# Patient Record
Sex: Female | Born: 1935 | Race: White | Hispanic: No | Marital: Married | Smoking: Former smoker
Health system: Southern US, Community
[De-identification: ages and names within clinical notes are randomized; demographics above are authoritative.]

## PROBLEM LIST (undated history)

## (undated) DIAGNOSIS — F329 Major depressive disorder, single episode, unspecified: Secondary | ICD-10-CM

## (undated) DIAGNOSIS — C801 Malignant (primary) neoplasm, unspecified: Secondary | ICD-10-CM

## (undated) DIAGNOSIS — I471 Supraventricular tachycardia, unspecified: Secondary | ICD-10-CM

## (undated) DIAGNOSIS — K219 Gastro-esophageal reflux disease without esophagitis: Secondary | ICD-10-CM

## (undated) DIAGNOSIS — Z923 Personal history of irradiation: Secondary | ICD-10-CM

## (undated) DIAGNOSIS — E039 Hypothyroidism, unspecified: Secondary | ICD-10-CM

## (undated) DIAGNOSIS — R911 Solitary pulmonary nodule: Secondary | ICD-10-CM

## (undated) DIAGNOSIS — T7840XA Allergy, unspecified, initial encounter: Secondary | ICD-10-CM

## (undated) DIAGNOSIS — J329 Chronic sinusitis, unspecified: Secondary | ICD-10-CM

## (undated) DIAGNOSIS — J449 Chronic obstructive pulmonary disease, unspecified: Secondary | ICD-10-CM

## (undated) DIAGNOSIS — F419 Anxiety disorder, unspecified: Secondary | ICD-10-CM

## (undated) DIAGNOSIS — I4892 Unspecified atrial flutter: Secondary | ICD-10-CM

## (undated) DIAGNOSIS — G4733 Obstructive sleep apnea (adult) (pediatric): Secondary | ICD-10-CM

## (undated) DIAGNOSIS — C3490 Malignant neoplasm of unspecified part of unspecified bronchus or lung: Secondary | ICD-10-CM

## (undated) DIAGNOSIS — F32A Depression, unspecified: Secondary | ICD-10-CM

## (undated) HISTORY — PX: ABDOMINAL HYSTERECTOMY: SHX81

## (undated) HISTORY — DX: Malignant neoplasm of unspecified part of unspecified bronchus or lung: C34.90

## (undated) HISTORY — DX: Major depressive disorder, single episode, unspecified: F32.9

## (undated) HISTORY — DX: Depression, unspecified: F32.A

## (undated) HISTORY — DX: Anxiety disorder, unspecified: F41.9

## (undated) HISTORY — DX: Chronic sinusitis, unspecified: J32.9

## (undated) HISTORY — DX: Supraventricular tachycardia, unspecified: I47.10

## (undated) HISTORY — DX: Chronic obstructive pulmonary disease, unspecified: J44.9

## (undated) HISTORY — PX: CYSTECTOMY: SUR359

## (undated) HISTORY — DX: Obstructive sleep apnea (adult) (pediatric): G47.33

## (undated) HISTORY — DX: Supraventricular tachycardia: I47.1

## (undated) HISTORY — DX: Personal history of irradiation: Z92.3

## (undated) HISTORY — DX: Hypothyroidism, unspecified: E03.9

## (undated) HISTORY — DX: Unspecified atrial flutter: I48.92

## (undated) HISTORY — PX: OTHER SURGICAL HISTORY: SHX169

## (undated) HISTORY — DX: Allergy, unspecified, initial encounter: T78.40XA

## (undated) HISTORY — PX: BLADDER REPAIR: SHX76

## (undated) HISTORY — PX: LUNG BIOPSY: SHX232

## (undated) HISTORY — DX: Gastro-esophageal reflux disease without esophagitis: K21.9

## (undated) HISTORY — DX: Solitary pulmonary nodule: R91.1

## (undated) HISTORY — PX: TONSILLECTOMY: SUR1361

## (undated) HISTORY — PX: APPENDECTOMY: SHX54

---

## 1998-01-22 ENCOUNTER — Other Ambulatory Visit: Admission: RE | Admit: 1998-01-22 | Discharge: 1998-01-22 | Payer: Self-pay | Admitting: *Deleted

## 1999-07-12 ENCOUNTER — Other Ambulatory Visit: Admission: RE | Admit: 1999-07-12 | Discharge: 1999-07-12 | Payer: Self-pay | Admitting: *Deleted

## 2000-09-27 ENCOUNTER — Other Ambulatory Visit: Admission: RE | Admit: 2000-09-27 | Discharge: 2000-09-27 | Payer: Self-pay | Admitting: *Deleted

## 2001-11-12 ENCOUNTER — Encounter: Payer: Self-pay | Admitting: Gastroenterology

## 2001-11-12 ENCOUNTER — Ambulatory Visit (HOSPITAL_COMMUNITY): Admission: RE | Admit: 2001-11-12 | Discharge: 2001-11-12 | Payer: Self-pay | Admitting: Gastroenterology

## 2003-06-15 ENCOUNTER — Encounter: Payer: Self-pay | Admitting: Internal Medicine

## 2003-06-15 ENCOUNTER — Ambulatory Visit (HOSPITAL_COMMUNITY): Admission: RE | Admit: 2003-06-15 | Discharge: 2003-06-15 | Payer: Self-pay | Admitting: Internal Medicine

## 2004-09-22 ENCOUNTER — Ambulatory Visit: Payer: Self-pay | Admitting: Internal Medicine

## 2004-10-07 ENCOUNTER — Ambulatory Visit: Payer: Self-pay | Admitting: Internal Medicine

## 2004-10-13 ENCOUNTER — Ambulatory Visit: Payer: Self-pay | Admitting: Internal Medicine

## 2004-10-20 ENCOUNTER — Ambulatory Visit: Payer: Self-pay | Admitting: Internal Medicine

## 2004-11-07 ENCOUNTER — Ambulatory Visit: Payer: Self-pay | Admitting: Internal Medicine

## 2004-11-18 ENCOUNTER — Ambulatory Visit: Payer: Self-pay | Admitting: Internal Medicine

## 2004-12-06 ENCOUNTER — Emergency Department (HOSPITAL_COMMUNITY): Admission: EM | Admit: 2004-12-06 | Discharge: 2004-12-06 | Payer: Self-pay | Admitting: Emergency Medicine

## 2004-12-09 ENCOUNTER — Ambulatory Visit: Payer: Self-pay | Admitting: Internal Medicine

## 2004-12-12 ENCOUNTER — Ambulatory Visit: Payer: Self-pay | Admitting: Internal Medicine

## 2004-12-22 ENCOUNTER — Ambulatory Visit: Payer: Self-pay | Admitting: Internal Medicine

## 2004-12-29 ENCOUNTER — Ambulatory Visit: Payer: Self-pay | Admitting: Internal Medicine

## 2005-01-04 ENCOUNTER — Ambulatory Visit: Payer: Self-pay | Admitting: Internal Medicine

## 2005-01-26 ENCOUNTER — Ambulatory Visit: Payer: Self-pay | Admitting: Internal Medicine

## 2005-02-02 ENCOUNTER — Ambulatory Visit: Payer: Self-pay | Admitting: Internal Medicine

## 2005-02-09 ENCOUNTER — Ambulatory Visit: Payer: Self-pay | Admitting: Internal Medicine

## 2005-03-09 ENCOUNTER — Ambulatory Visit: Payer: Self-pay | Admitting: Internal Medicine

## 2005-03-16 ENCOUNTER — Ambulatory Visit: Payer: Self-pay | Admitting: Internal Medicine

## 2005-03-17 ENCOUNTER — Ambulatory Visit: Payer: Self-pay | Admitting: Internal Medicine

## 2005-03-30 ENCOUNTER — Ambulatory Visit: Payer: Self-pay | Admitting: Internal Medicine

## 2005-04-06 ENCOUNTER — Ambulatory Visit: Payer: Self-pay | Admitting: Internal Medicine

## 2005-04-20 ENCOUNTER — Ambulatory Visit: Payer: Self-pay | Admitting: Internal Medicine

## 2005-05-03 ENCOUNTER — Ambulatory Visit: Payer: Self-pay | Admitting: Cardiology

## 2005-05-05 ENCOUNTER — Ambulatory Visit: Payer: Self-pay | Admitting: Internal Medicine

## 2005-05-15 ENCOUNTER — Ambulatory Visit: Payer: Self-pay | Admitting: Internal Medicine

## 2005-05-23 ENCOUNTER — Ambulatory Visit: Payer: Self-pay | Admitting: Internal Medicine

## 2005-06-09 ENCOUNTER — Ambulatory Visit: Payer: Self-pay | Admitting: Internal Medicine

## 2005-06-15 ENCOUNTER — Ambulatory Visit: Payer: Self-pay | Admitting: Internal Medicine

## 2005-07-10 ENCOUNTER — Encounter: Admission: RE | Admit: 2005-07-10 | Discharge: 2005-07-10 | Payer: Self-pay | Admitting: Internal Medicine

## 2005-07-13 ENCOUNTER — Ambulatory Visit: Payer: Self-pay | Admitting: Internal Medicine

## 2005-07-27 ENCOUNTER — Ambulatory Visit: Payer: Self-pay | Admitting: Internal Medicine

## 2005-08-03 ENCOUNTER — Ambulatory Visit: Payer: Self-pay | Admitting: Internal Medicine

## 2005-08-07 ENCOUNTER — Ambulatory Visit: Payer: Self-pay | Admitting: Pulmonary Disease

## 2005-08-22 ENCOUNTER — Ambulatory Visit: Payer: Self-pay | Admitting: Internal Medicine

## 2005-09-21 ENCOUNTER — Ambulatory Visit: Payer: Self-pay | Admitting: Internal Medicine

## 2005-10-24 ENCOUNTER — Ambulatory Visit: Payer: Self-pay | Admitting: Internal Medicine

## 2005-11-02 ENCOUNTER — Ambulatory Visit: Payer: Self-pay | Admitting: Internal Medicine

## 2005-11-06 ENCOUNTER — Ambulatory Visit: Payer: Self-pay | Admitting: Internal Medicine

## 2005-11-16 ENCOUNTER — Ambulatory Visit: Payer: Self-pay | Admitting: Internal Medicine

## 2005-11-23 ENCOUNTER — Ambulatory Visit: Payer: Self-pay | Admitting: Internal Medicine

## 2005-11-24 ENCOUNTER — Ambulatory Visit: Payer: Self-pay | Admitting: Internal Medicine

## 2005-11-30 ENCOUNTER — Ambulatory Visit: Payer: Self-pay | Admitting: Internal Medicine

## 2005-12-07 ENCOUNTER — Ambulatory Visit: Payer: Self-pay | Admitting: Internal Medicine

## 2005-12-28 ENCOUNTER — Other Ambulatory Visit: Admission: RE | Admit: 2005-12-28 | Discharge: 2005-12-28 | Payer: Self-pay | Admitting: Internal Medicine

## 2006-01-05 ENCOUNTER — Ambulatory Visit: Payer: Self-pay | Admitting: Internal Medicine

## 2006-01-18 ENCOUNTER — Ambulatory Visit: Payer: Self-pay | Admitting: Internal Medicine

## 2006-02-06 ENCOUNTER — Ambulatory Visit: Payer: Self-pay | Admitting: Internal Medicine

## 2006-02-23 ENCOUNTER — Ambulatory Visit: Payer: Self-pay | Admitting: Internal Medicine

## 2006-03-09 ENCOUNTER — Ambulatory Visit: Payer: Self-pay | Admitting: Internal Medicine

## 2006-03-15 ENCOUNTER — Ambulatory Visit: Payer: Self-pay | Admitting: Internal Medicine

## 2006-03-29 ENCOUNTER — Ambulatory Visit: Payer: Self-pay | Admitting: Internal Medicine

## 2006-04-19 ENCOUNTER — Ambulatory Visit: Payer: Self-pay | Admitting: Internal Medicine

## 2006-04-27 ENCOUNTER — Ambulatory Visit: Payer: Self-pay | Admitting: Internal Medicine

## 2006-05-04 ENCOUNTER — Ambulatory Visit: Payer: Self-pay | Admitting: Internal Medicine

## 2006-05-11 ENCOUNTER — Ambulatory Visit: Payer: Self-pay | Admitting: Internal Medicine

## 2006-05-14 ENCOUNTER — Ambulatory Visit: Payer: Self-pay | Admitting: Internal Medicine

## 2006-06-01 ENCOUNTER — Ambulatory Visit: Payer: Self-pay | Admitting: Internal Medicine

## 2006-06-15 ENCOUNTER — Ambulatory Visit: Payer: Self-pay | Admitting: Internal Medicine

## 2006-06-18 ENCOUNTER — Ambulatory Visit: Payer: Self-pay | Admitting: Internal Medicine

## 2006-06-22 ENCOUNTER — Ambulatory Visit: Payer: Self-pay | Admitting: Internal Medicine

## 2006-06-27 ENCOUNTER — Ambulatory Visit: Payer: Self-pay | Admitting: Internal Medicine

## 2006-07-13 ENCOUNTER — Ambulatory Visit: Payer: Self-pay | Admitting: Internal Medicine

## 2006-07-27 ENCOUNTER — Ambulatory Visit: Payer: Self-pay | Admitting: Internal Medicine

## 2006-08-17 ENCOUNTER — Ambulatory Visit: Payer: Self-pay | Admitting: Internal Medicine

## 2006-08-24 ENCOUNTER — Ambulatory Visit: Payer: Self-pay | Admitting: Internal Medicine

## 2006-08-31 ENCOUNTER — Ambulatory Visit: Payer: Self-pay | Admitting: Internal Medicine

## 2006-09-21 ENCOUNTER — Ambulatory Visit: Payer: Self-pay | Admitting: Internal Medicine

## 2006-09-28 ENCOUNTER — Ambulatory Visit: Payer: Self-pay | Admitting: Internal Medicine

## 2006-10-02 ENCOUNTER — Encounter (HOSPITAL_COMMUNITY): Admission: RE | Admit: 2006-10-02 | Discharge: 2006-12-31 | Payer: Self-pay | Admitting: Internal Medicine

## 2006-10-12 ENCOUNTER — Ambulatory Visit: Payer: Self-pay | Admitting: Internal Medicine

## 2006-10-26 ENCOUNTER — Ambulatory Visit: Payer: Self-pay | Admitting: Internal Medicine

## 2006-11-05 ENCOUNTER — Ambulatory Visit: Payer: Self-pay | Admitting: Internal Medicine

## 2006-11-16 ENCOUNTER — Ambulatory Visit: Payer: Self-pay | Admitting: Internal Medicine

## 2006-11-30 ENCOUNTER — Ambulatory Visit: Payer: Self-pay | Admitting: Internal Medicine

## 2006-12-06 ENCOUNTER — Ambulatory Visit: Payer: Self-pay | Admitting: Internal Medicine

## 2006-12-14 ENCOUNTER — Ambulatory Visit: Payer: Self-pay | Admitting: Internal Medicine

## 2006-12-21 ENCOUNTER — Ambulatory Visit: Payer: Self-pay | Admitting: Internal Medicine

## 2006-12-25 ENCOUNTER — Ambulatory Visit: Payer: Self-pay | Admitting: Internal Medicine

## 2007-01-11 ENCOUNTER — Ambulatory Visit: Payer: Self-pay | Admitting: Internal Medicine

## 2007-01-17 ENCOUNTER — Encounter (HOSPITAL_COMMUNITY): Admission: RE | Admit: 2007-01-17 | Discharge: 2007-04-17 | Payer: Self-pay | Admitting: Internal Medicine

## 2007-02-08 ENCOUNTER — Ambulatory Visit: Payer: Self-pay | Admitting: Internal Medicine

## 2007-02-15 ENCOUNTER — Ambulatory Visit: Payer: Self-pay | Admitting: Internal Medicine

## 2007-03-01 ENCOUNTER — Ambulatory Visit: Payer: Self-pay | Admitting: Internal Medicine

## 2007-03-08 ENCOUNTER — Ambulatory Visit: Payer: Self-pay | Admitting: Internal Medicine

## 2007-03-15 ENCOUNTER — Ambulatory Visit: Payer: Self-pay | Admitting: Internal Medicine

## 2007-03-29 ENCOUNTER — Ambulatory Visit: Payer: Self-pay | Admitting: Internal Medicine

## 2007-04-05 ENCOUNTER — Ambulatory Visit: Payer: Self-pay | Admitting: Internal Medicine

## 2007-04-12 ENCOUNTER — Ambulatory Visit: Payer: Self-pay | Admitting: Internal Medicine

## 2007-04-19 ENCOUNTER — Encounter (HOSPITAL_COMMUNITY): Admission: RE | Admit: 2007-04-19 | Discharge: 2007-07-18 | Payer: Self-pay | Admitting: Internal Medicine

## 2007-04-26 ENCOUNTER — Ambulatory Visit: Payer: Self-pay | Admitting: Internal Medicine

## 2007-05-02 ENCOUNTER — Ambulatory Visit: Payer: Self-pay | Admitting: Internal Medicine

## 2007-05-10 ENCOUNTER — Ambulatory Visit: Payer: Self-pay | Admitting: Internal Medicine

## 2007-05-17 ENCOUNTER — Ambulatory Visit: Payer: Self-pay | Admitting: Internal Medicine

## 2007-05-24 ENCOUNTER — Ambulatory Visit: Payer: Self-pay | Admitting: Internal Medicine

## 2007-06-06 ENCOUNTER — Ambulatory Visit: Payer: Self-pay | Admitting: Internal Medicine

## 2007-06-06 ENCOUNTER — Ambulatory Visit: Payer: Self-pay | Admitting: Pulmonary Disease

## 2007-06-07 ENCOUNTER — Ambulatory Visit: Payer: Self-pay | Admitting: Internal Medicine

## 2007-06-11 ENCOUNTER — Ambulatory Visit: Payer: Self-pay | Admitting: Internal Medicine

## 2007-06-21 ENCOUNTER — Ambulatory Visit: Payer: Self-pay | Admitting: Internal Medicine

## 2007-06-28 DIAGNOSIS — J3089 Other allergic rhinitis: Secondary | ICD-10-CM

## 2007-06-28 DIAGNOSIS — J439 Emphysema, unspecified: Secondary | ICD-10-CM

## 2007-06-28 DIAGNOSIS — J302 Other seasonal allergic rhinitis: Secondary | ICD-10-CM

## 2007-06-28 DIAGNOSIS — J449 Chronic obstructive pulmonary disease, unspecified: Secondary | ICD-10-CM

## 2007-06-28 DIAGNOSIS — G4733 Obstructive sleep apnea (adult) (pediatric): Secondary | ICD-10-CM

## 2007-07-05 ENCOUNTER — Ambulatory Visit: Payer: Self-pay | Admitting: Internal Medicine

## 2007-07-19 ENCOUNTER — Ambulatory Visit: Payer: Self-pay | Admitting: Internal Medicine

## 2007-07-20 ENCOUNTER — Encounter (HOSPITAL_COMMUNITY): Admission: RE | Admit: 2007-07-20 | Discharge: 2007-09-17 | Payer: Self-pay | Admitting: Internal Medicine

## 2007-07-23 ENCOUNTER — Ambulatory Visit: Payer: Self-pay | Admitting: Internal Medicine

## 2007-08-02 ENCOUNTER — Ambulatory Visit: Payer: Self-pay | Admitting: Internal Medicine

## 2007-08-19 ENCOUNTER — Telehealth (INDEPENDENT_AMBULATORY_CARE_PROVIDER_SITE_OTHER): Payer: Self-pay | Admitting: *Deleted

## 2007-09-02 ENCOUNTER — Telehealth: Payer: Self-pay | Admitting: Internal Medicine

## 2007-09-02 DIAGNOSIS — J329 Chronic sinusitis, unspecified: Secondary | ICD-10-CM | POA: Insufficient documentation

## 2007-09-18 ENCOUNTER — Ambulatory Visit: Payer: Self-pay | Admitting: Internal Medicine

## 2007-09-19 ENCOUNTER — Encounter (HOSPITAL_COMMUNITY): Admission: RE | Admit: 2007-09-19 | Discharge: 2007-12-12 | Payer: Self-pay | Admitting: Internal Medicine

## 2007-09-23 ENCOUNTER — Encounter: Payer: Self-pay | Admitting: Internal Medicine

## 2007-10-11 ENCOUNTER — Ambulatory Visit: Payer: Self-pay | Admitting: Internal Medicine

## 2007-10-29 ENCOUNTER — Ambulatory Visit: Payer: Self-pay | Admitting: Internal Medicine

## 2007-11-15 ENCOUNTER — Ambulatory Visit: Payer: Self-pay | Admitting: Internal Medicine

## 2007-11-19 ENCOUNTER — Ambulatory Visit: Payer: Self-pay | Admitting: Internal Medicine

## 2007-11-20 ENCOUNTER — Encounter: Payer: Self-pay | Admitting: Internal Medicine

## 2007-11-21 ENCOUNTER — Ambulatory Visit: Payer: Self-pay | Admitting: Internal Medicine

## 2007-12-05 ENCOUNTER — Ambulatory Visit: Payer: Self-pay | Admitting: Internal Medicine

## 2007-12-20 ENCOUNTER — Ambulatory Visit: Payer: Self-pay | Admitting: Internal Medicine

## 2008-01-03 ENCOUNTER — Ambulatory Visit: Payer: Self-pay | Admitting: Internal Medicine

## 2008-01-10 ENCOUNTER — Ambulatory Visit: Payer: Self-pay | Admitting: Internal Medicine

## 2008-01-17 ENCOUNTER — Ambulatory Visit: Payer: Self-pay | Admitting: Internal Medicine

## 2008-01-31 ENCOUNTER — Ambulatory Visit: Payer: Self-pay | Admitting: Internal Medicine

## 2008-02-14 ENCOUNTER — Ambulatory Visit: Payer: Self-pay | Admitting: Internal Medicine

## 2008-02-25 ENCOUNTER — Ambulatory Visit: Payer: Self-pay | Admitting: Internal Medicine

## 2008-02-27 ENCOUNTER — Encounter: Admission: RE | Admit: 2008-02-27 | Discharge: 2008-02-27 | Payer: Self-pay | Admitting: Internal Medicine

## 2008-03-18 ENCOUNTER — Ambulatory Visit: Payer: Self-pay | Admitting: Internal Medicine

## 2008-03-19 ENCOUNTER — Ambulatory Visit: Payer: Self-pay | Admitting: Internal Medicine

## 2008-03-27 ENCOUNTER — Ambulatory Visit: Payer: Self-pay | Admitting: Internal Medicine

## 2008-04-03 ENCOUNTER — Ambulatory Visit: Payer: Self-pay | Admitting: Internal Medicine

## 2008-04-17 ENCOUNTER — Ambulatory Visit: Payer: Self-pay | Admitting: Internal Medicine

## 2008-04-24 ENCOUNTER — Ambulatory Visit: Payer: Self-pay | Admitting: Internal Medicine

## 2008-04-30 ENCOUNTER — Encounter: Payer: Self-pay | Admitting: Internal Medicine

## 2008-05-01 ENCOUNTER — Ambulatory Visit: Payer: Self-pay | Admitting: Internal Medicine

## 2008-05-08 ENCOUNTER — Ambulatory Visit: Payer: Self-pay | Admitting: Internal Medicine

## 2008-05-22 ENCOUNTER — Ambulatory Visit: Payer: Self-pay | Admitting: Internal Medicine

## 2008-05-26 ENCOUNTER — Ambulatory Visit: Payer: Self-pay | Admitting: Internal Medicine

## 2008-05-29 ENCOUNTER — Ambulatory Visit: Payer: Self-pay | Admitting: Internal Medicine

## 2008-06-05 ENCOUNTER — Ambulatory Visit: Payer: Self-pay | Admitting: Internal Medicine

## 2008-06-09 ENCOUNTER — Ambulatory Visit: Payer: Self-pay | Admitting: Internal Medicine

## 2008-06-26 ENCOUNTER — Ambulatory Visit: Payer: Self-pay | Admitting: Internal Medicine

## 2008-07-10 ENCOUNTER — Ambulatory Visit: Payer: Self-pay | Admitting: Internal Medicine

## 2008-07-13 ENCOUNTER — Telehealth: Payer: Self-pay | Admitting: Internal Medicine

## 2008-07-16 ENCOUNTER — Ambulatory Visit: Payer: Self-pay | Admitting: Internal Medicine

## 2008-07-17 ENCOUNTER — Ambulatory Visit: Payer: Self-pay | Admitting: Internal Medicine

## 2008-07-22 ENCOUNTER — Ambulatory Visit: Payer: Self-pay | Admitting: Internal Medicine

## 2008-07-22 LAB — CONVERTED CEMR LAB
Basophils Absolute: 0.4 10*3/uL — ABNORMAL HIGH (ref 0.0–0.1)
Basophils Relative: 3.1 % — ABNORMAL HIGH (ref 0.0–3.0)
Eosinophils Absolute: 0.1 10*3/uL (ref 0.0–0.7)
HCT: 45 % (ref 36.0–46.0)
Hemoglobin: 15.6 g/dL — ABNORMAL HIGH (ref 12.0–15.0)
MCHC: 34.7 g/dL (ref 30.0–36.0)
MCV: 90.4 fL (ref 78.0–100.0)
Neutro Abs: 10.4 10*3/uL — ABNORMAL HIGH (ref 1.4–7.7)
RBC: 4.98 M/uL (ref 3.87–5.11)

## 2008-07-31 ENCOUNTER — Ambulatory Visit: Payer: Self-pay | Admitting: Internal Medicine

## 2008-07-31 DIAGNOSIS — R0602 Shortness of breath: Secondary | ICD-10-CM

## 2008-08-07 ENCOUNTER — Telehealth (INDEPENDENT_AMBULATORY_CARE_PROVIDER_SITE_OTHER): Payer: Self-pay | Admitting: *Deleted

## 2008-08-17 ENCOUNTER — Telehealth: Payer: Self-pay | Admitting: Internal Medicine

## 2008-09-04 ENCOUNTER — Ambulatory Visit: Payer: Self-pay | Admitting: Internal Medicine

## 2008-09-07 ENCOUNTER — Encounter: Payer: Self-pay | Admitting: Internal Medicine

## 2008-09-10 ENCOUNTER — Ambulatory Visit: Payer: Self-pay | Admitting: Internal Medicine

## 2008-09-16 ENCOUNTER — Ambulatory Visit: Payer: Self-pay | Admitting: Internal Medicine

## 2008-09-28 ENCOUNTER — Encounter: Payer: Self-pay | Admitting: Internal Medicine

## 2008-10-02 ENCOUNTER — Ambulatory Visit: Payer: Self-pay | Admitting: Internal Medicine

## 2008-10-09 ENCOUNTER — Emergency Department (HOSPITAL_COMMUNITY): Admission: EM | Admit: 2008-10-09 | Discharge: 2008-10-09 | Payer: Self-pay | Admitting: Emergency Medicine

## 2008-10-15 ENCOUNTER — Ambulatory Visit: Payer: Self-pay | Admitting: Internal Medicine

## 2008-10-20 ENCOUNTER — Ambulatory Visit: Payer: Self-pay | Admitting: Internal Medicine

## 2008-10-21 ENCOUNTER — Encounter: Payer: Self-pay | Admitting: Internal Medicine

## 2008-10-27 ENCOUNTER — Ambulatory Visit: Payer: Self-pay | Admitting: Internal Medicine

## 2008-10-27 ENCOUNTER — Telehealth: Payer: Self-pay | Admitting: Internal Medicine

## 2008-10-29 ENCOUNTER — Ambulatory Visit: Payer: Self-pay | Admitting: Internal Medicine

## 2008-10-30 ENCOUNTER — Telehealth (INDEPENDENT_AMBULATORY_CARE_PROVIDER_SITE_OTHER): Payer: Self-pay | Admitting: *Deleted

## 2008-10-30 DIAGNOSIS — I471 Supraventricular tachycardia: Secondary | ICD-10-CM

## 2008-11-01 ENCOUNTER — Inpatient Hospital Stay (HOSPITAL_COMMUNITY): Admission: EM | Admit: 2008-11-01 | Discharge: 2008-11-06 | Payer: Self-pay | Admitting: Emergency Medicine

## 2008-11-01 ENCOUNTER — Ambulatory Visit: Payer: Self-pay | Admitting: Internal Medicine

## 2008-11-03 ENCOUNTER — Encounter: Payer: Self-pay | Admitting: Internal Medicine

## 2008-11-05 ENCOUNTER — Encounter: Payer: Self-pay | Admitting: Internal Medicine

## 2008-11-12 ENCOUNTER — Ambulatory Visit: Payer: Self-pay | Admitting: Internal Medicine

## 2008-11-18 ENCOUNTER — Ambulatory Visit: Payer: Self-pay | Admitting: Internal Medicine

## 2008-11-19 ENCOUNTER — Ambulatory Visit: Payer: Self-pay | Admitting: Internal Medicine

## 2008-11-20 ENCOUNTER — Ambulatory Visit: Payer: Self-pay | Admitting: Internal Medicine

## 2008-11-20 DIAGNOSIS — J984 Other disorders of lung: Secondary | ICD-10-CM

## 2008-11-20 DIAGNOSIS — N289 Disorder of kidney and ureter, unspecified: Secondary | ICD-10-CM | POA: Insufficient documentation

## 2008-11-24 ENCOUNTER — Ambulatory Visit: Payer: Self-pay | Admitting: Cardiology

## 2008-11-25 ENCOUNTER — Ambulatory Visit (HOSPITAL_COMMUNITY): Admission: RE | Admit: 2008-11-25 | Discharge: 2008-11-25 | Payer: Self-pay | Admitting: Internal Medicine

## 2008-11-26 ENCOUNTER — Ambulatory Visit: Payer: Self-pay | Admitting: Internal Medicine

## 2008-11-27 ENCOUNTER — Telehealth: Payer: Self-pay | Admitting: Internal Medicine

## 2008-12-02 ENCOUNTER — Telehealth: Payer: Self-pay | Admitting: Internal Medicine

## 2008-12-03 ENCOUNTER — Ambulatory Visit (HOSPITAL_COMMUNITY): Admission: RE | Admit: 2008-12-03 | Discharge: 2008-12-03 | Payer: Self-pay | Admitting: Internal Medicine

## 2008-12-07 ENCOUNTER — Ambulatory Visit: Payer: Self-pay | Admitting: Thoracic Surgery

## 2008-12-07 ENCOUNTER — Encounter: Payer: Self-pay | Admitting: Internal Medicine

## 2008-12-08 ENCOUNTER — Ambulatory Visit: Payer: Self-pay | Admitting: Internal Medicine

## 2008-12-10 ENCOUNTER — Ambulatory Visit: Payer: Self-pay | Admitting: Internal Medicine

## 2008-12-22 ENCOUNTER — Ambulatory Visit: Payer: Self-pay | Admitting: Internal Medicine

## 2008-12-23 ENCOUNTER — Ambulatory Visit: Payer: Self-pay | Admitting: Internal Medicine

## 2008-12-24 ENCOUNTER — Inpatient Hospital Stay (HOSPITAL_COMMUNITY): Admission: RE | Admit: 2008-12-24 | Discharge: 2008-12-29 | Payer: Self-pay | Admitting: Thoracic Surgery

## 2008-12-24 ENCOUNTER — Ambulatory Visit: Payer: Self-pay | Admitting: Surgery

## 2008-12-24 ENCOUNTER — Encounter (INDEPENDENT_AMBULATORY_CARE_PROVIDER_SITE_OTHER): Payer: Self-pay | Admitting: Interventional Radiology

## 2009-01-04 ENCOUNTER — Ambulatory Visit: Payer: Self-pay | Admitting: Internal Medicine

## 2009-01-05 ENCOUNTER — Ambulatory Visit: Payer: Self-pay | Admitting: Thoracic Surgery

## 2009-01-05 ENCOUNTER — Encounter: Payer: Self-pay | Admitting: Internal Medicine

## 2009-01-05 ENCOUNTER — Encounter: Admission: RE | Admit: 2009-01-05 | Discharge: 2009-01-05 | Payer: Self-pay | Admitting: Thoracic Surgery

## 2009-01-07 ENCOUNTER — Ambulatory Visit: Payer: Self-pay | Admitting: Internal Medicine

## 2009-01-12 ENCOUNTER — Telehealth: Payer: Self-pay | Admitting: Internal Medicine

## 2009-01-22 ENCOUNTER — Ambulatory Visit: Payer: Self-pay | Admitting: Internal Medicine

## 2009-01-27 ENCOUNTER — Telehealth (INDEPENDENT_AMBULATORY_CARE_PROVIDER_SITE_OTHER): Payer: Self-pay | Admitting: *Deleted

## 2009-02-02 ENCOUNTER — Encounter: Payer: Self-pay | Admitting: Internal Medicine

## 2009-02-05 ENCOUNTER — Ambulatory Visit: Payer: Self-pay | Admitting: Internal Medicine

## 2009-02-18 ENCOUNTER — Ambulatory Visit: Payer: Self-pay | Admitting: Internal Medicine

## 2009-02-19 ENCOUNTER — Ambulatory Visit: Payer: Self-pay | Admitting: Internal Medicine

## 2009-02-26 ENCOUNTER — Ambulatory Visit: Payer: Self-pay | Admitting: Internal Medicine

## 2009-03-01 ENCOUNTER — Ambulatory Visit: Payer: Self-pay | Admitting: Internal Medicine

## 2009-03-05 ENCOUNTER — Ambulatory Visit: Payer: Self-pay | Admitting: Internal Medicine

## 2009-03-15 ENCOUNTER — Telehealth (INDEPENDENT_AMBULATORY_CARE_PROVIDER_SITE_OTHER): Payer: Self-pay | Admitting: *Deleted

## 2009-03-17 ENCOUNTER — Encounter: Admission: RE | Admit: 2009-03-17 | Discharge: 2009-03-17 | Payer: Self-pay | Admitting: Thoracic Surgery

## 2009-03-17 ENCOUNTER — Ambulatory Visit: Payer: Self-pay | Admitting: Thoracic Surgery

## 2009-03-17 ENCOUNTER — Encounter: Payer: Self-pay | Admitting: Internal Medicine

## 2009-03-25 ENCOUNTER — Ambulatory Visit: Payer: Self-pay | Admitting: Internal Medicine

## 2009-03-26 ENCOUNTER — Ambulatory Visit: Payer: Self-pay | Admitting: Internal Medicine

## 2009-04-02 ENCOUNTER — Ambulatory Visit: Payer: Self-pay | Admitting: Internal Medicine

## 2009-04-09 ENCOUNTER — Ambulatory Visit: Payer: Self-pay | Admitting: Internal Medicine

## 2009-04-22 ENCOUNTER — Ambulatory Visit: Payer: Self-pay | Admitting: Internal Medicine

## 2009-04-23 ENCOUNTER — Ambulatory Visit: Payer: Self-pay | Admitting: Internal Medicine

## 2009-04-30 ENCOUNTER — Ambulatory Visit: Payer: Self-pay | Admitting: Internal Medicine

## 2009-05-07 ENCOUNTER — Ambulatory Visit: Payer: Self-pay | Admitting: Internal Medicine

## 2009-05-12 ENCOUNTER — Telehealth: Payer: Self-pay | Admitting: Internal Medicine

## 2009-05-14 ENCOUNTER — Ambulatory Visit: Payer: Self-pay | Admitting: Internal Medicine

## 2009-05-25 ENCOUNTER — Ambulatory Visit: Payer: Self-pay | Admitting: Internal Medicine

## 2009-05-28 ENCOUNTER — Ambulatory Visit: Payer: Self-pay | Admitting: Internal Medicine

## 2009-06-02 ENCOUNTER — Ambulatory Visit: Payer: Self-pay | Admitting: Internal Medicine

## 2009-06-02 DIAGNOSIS — R042 Hemoptysis: Secondary | ICD-10-CM

## 2009-06-08 ENCOUNTER — Ambulatory Visit: Payer: Self-pay | Admitting: Critical Care Medicine

## 2009-06-22 ENCOUNTER — Ambulatory Visit: Payer: Self-pay | Admitting: Internal Medicine

## 2009-06-25 ENCOUNTER — Ambulatory Visit: Payer: Self-pay | Admitting: Internal Medicine

## 2009-06-28 ENCOUNTER — Ambulatory Visit: Payer: Self-pay | Admitting: Internal Medicine

## 2009-06-29 ENCOUNTER — Ambulatory Visit: Payer: Self-pay | Admitting: Internal Medicine

## 2009-07-02 ENCOUNTER — Ambulatory Visit: Payer: Self-pay | Admitting: Internal Medicine

## 2009-07-08 ENCOUNTER — Ambulatory Visit: Payer: Self-pay | Admitting: Internal Medicine

## 2009-07-16 ENCOUNTER — Ambulatory Visit: Payer: Self-pay | Admitting: Internal Medicine

## 2009-07-22 ENCOUNTER — Ambulatory Visit: Payer: Self-pay | Admitting: Internal Medicine

## 2009-07-23 ENCOUNTER — Telehealth: Payer: Self-pay | Admitting: Internal Medicine

## 2009-07-30 ENCOUNTER — Ambulatory Visit: Payer: Self-pay | Admitting: Internal Medicine

## 2009-08-06 ENCOUNTER — Ambulatory Visit: Payer: Self-pay | Admitting: Internal Medicine

## 2009-08-26 ENCOUNTER — Ambulatory Visit: Payer: Self-pay | Admitting: Internal Medicine

## 2009-09-07 ENCOUNTER — Ambulatory Visit: Payer: Self-pay | Admitting: Internal Medicine

## 2009-09-09 ENCOUNTER — Ambulatory Visit: Payer: Self-pay | Admitting: Internal Medicine

## 2009-09-14 ENCOUNTER — Telehealth: Payer: Self-pay | Admitting: Internal Medicine

## 2009-09-17 ENCOUNTER — Emergency Department (HOSPITAL_COMMUNITY): Admission: EM | Admit: 2009-09-17 | Discharge: 2009-09-17 | Payer: Self-pay | Admitting: Emergency Medicine

## 2009-09-18 ENCOUNTER — Telehealth: Payer: Self-pay | Admitting: Internal Medicine

## 2009-09-24 ENCOUNTER — Ambulatory Visit: Admission: RE | Admit: 2009-09-24 | Discharge: 2009-11-18 | Payer: Self-pay | Admitting: Radiation Oncology

## 2009-09-24 ENCOUNTER — Ambulatory Visit: Payer: Self-pay | Admitting: Internal Medicine

## 2009-09-27 ENCOUNTER — Telehealth (INDEPENDENT_AMBULATORY_CARE_PROVIDER_SITE_OTHER): Payer: Self-pay | Admitting: *Deleted

## 2009-10-06 ENCOUNTER — Ambulatory Visit (HOSPITAL_COMMUNITY): Admission: RE | Admit: 2009-10-06 | Discharge: 2009-10-06 | Payer: Self-pay | Admitting: Gastroenterology

## 2009-10-08 ENCOUNTER — Ambulatory Visit: Payer: Self-pay | Admitting: Internal Medicine

## 2009-10-12 ENCOUNTER — Telehealth (INDEPENDENT_AMBULATORY_CARE_PROVIDER_SITE_OTHER): Payer: Self-pay | Admitting: *Deleted

## 2009-11-05 ENCOUNTER — Ambulatory Visit: Payer: Self-pay | Admitting: Internal Medicine

## 2009-11-19 ENCOUNTER — Ambulatory Visit: Payer: Self-pay | Admitting: Internal Medicine

## 2009-11-25 ENCOUNTER — Ambulatory Visit: Payer: Self-pay | Admitting: Internal Medicine

## 2009-12-03 ENCOUNTER — Ambulatory Visit: Payer: Self-pay | Admitting: Internal Medicine

## 2009-12-09 ENCOUNTER — Ambulatory Visit: Payer: Self-pay | Admitting: Internal Medicine

## 2009-12-10 ENCOUNTER — Ambulatory Visit: Payer: Self-pay | Admitting: Internal Medicine

## 2009-12-17 ENCOUNTER — Ambulatory Visit: Payer: Self-pay | Admitting: Internal Medicine

## 2009-12-23 ENCOUNTER — Ambulatory Visit: Payer: Self-pay | Admitting: Internal Medicine

## 2009-12-31 ENCOUNTER — Ambulatory Visit: Payer: Self-pay | Admitting: Internal Medicine

## 2010-01-11 ENCOUNTER — Ambulatory Visit: Payer: Self-pay | Admitting: Internal Medicine

## 2010-01-14 ENCOUNTER — Ambulatory Visit: Payer: Self-pay | Admitting: Internal Medicine

## 2010-01-17 ENCOUNTER — Telehealth (INDEPENDENT_AMBULATORY_CARE_PROVIDER_SITE_OTHER): Payer: Self-pay | Admitting: *Deleted

## 2010-01-24 ENCOUNTER — Telehealth (INDEPENDENT_AMBULATORY_CARE_PROVIDER_SITE_OTHER): Payer: Self-pay | Admitting: *Deleted

## 2010-01-25 ENCOUNTER — Ambulatory Visit: Payer: Self-pay | Admitting: Internal Medicine

## 2010-01-26 ENCOUNTER — Encounter: Payer: Self-pay | Admitting: Internal Medicine

## 2010-01-28 ENCOUNTER — Telehealth (INDEPENDENT_AMBULATORY_CARE_PROVIDER_SITE_OTHER): Payer: Self-pay | Admitting: *Deleted

## 2010-02-03 ENCOUNTER — Ambulatory Visit: Payer: Self-pay | Admitting: Internal Medicine

## 2010-02-06 ENCOUNTER — Encounter: Payer: Self-pay | Admitting: Internal Medicine

## 2010-02-08 ENCOUNTER — Ambulatory Visit: Payer: Self-pay | Admitting: Internal Medicine

## 2010-02-18 ENCOUNTER — Ambulatory Visit: Payer: Self-pay | Admitting: Internal Medicine

## 2010-02-22 ENCOUNTER — Encounter: Payer: Self-pay | Admitting: Internal Medicine

## 2010-03-03 ENCOUNTER — Ambulatory Visit: Payer: Self-pay | Admitting: Internal Medicine

## 2010-03-08 ENCOUNTER — Ambulatory Visit: Payer: Self-pay | Admitting: Internal Medicine

## 2010-03-22 ENCOUNTER — Ambulatory Visit: Payer: Self-pay | Admitting: Internal Medicine

## 2010-03-23 ENCOUNTER — Telehealth (INDEPENDENT_AMBULATORY_CARE_PROVIDER_SITE_OTHER): Payer: Self-pay | Admitting: *Deleted

## 2010-03-28 ENCOUNTER — Ambulatory Visit (HOSPITAL_COMMUNITY): Admission: RE | Admit: 2010-03-28 | Discharge: 2010-03-28 | Payer: Self-pay | Admitting: Radiation Oncology

## 2010-03-28 ENCOUNTER — Ambulatory Visit: Payer: Self-pay | Admitting: Internal Medicine

## 2010-03-31 ENCOUNTER — Encounter: Payer: Self-pay | Admitting: Internal Medicine

## 2010-04-05 ENCOUNTER — Ambulatory Visit: Payer: Self-pay | Admitting: Internal Medicine

## 2010-04-26 ENCOUNTER — Ambulatory Visit: Payer: Self-pay | Admitting: Internal Medicine

## 2010-05-11 ENCOUNTER — Ambulatory Visit: Payer: Self-pay | Admitting: Internal Medicine

## 2010-05-26 ENCOUNTER — Ambulatory Visit: Payer: Self-pay | Admitting: Internal Medicine

## 2010-05-27 ENCOUNTER — Telehealth: Payer: Self-pay | Admitting: Internal Medicine

## 2010-05-30 ENCOUNTER — Ambulatory Visit: Payer: Self-pay | Admitting: Internal Medicine

## 2010-06-06 ENCOUNTER — Encounter: Payer: Self-pay | Admitting: Internal Medicine

## 2010-06-09 ENCOUNTER — Ambulatory Visit: Payer: Self-pay | Admitting: Internal Medicine

## 2010-06-23 ENCOUNTER — Ambulatory Visit: Payer: Self-pay | Admitting: Internal Medicine

## 2010-06-25 ENCOUNTER — Encounter: Payer: Self-pay | Admitting: Internal Medicine

## 2010-07-11 ENCOUNTER — Ambulatory Visit: Payer: Self-pay | Admitting: Internal Medicine

## 2010-07-29 ENCOUNTER — Ambulatory Visit: Payer: Self-pay | Admitting: Internal Medicine

## 2010-08-19 ENCOUNTER — Ambulatory Visit: Payer: Self-pay | Admitting: Internal Medicine

## 2010-09-08 ENCOUNTER — Ambulatory Visit: Payer: Self-pay | Admitting: Internal Medicine

## 2010-09-29 ENCOUNTER — Telehealth (INDEPENDENT_AMBULATORY_CARE_PROVIDER_SITE_OTHER): Payer: Self-pay | Admitting: *Deleted

## 2010-09-30 ENCOUNTER — Ambulatory Visit
Admission: RE | Admit: 2010-09-30 | Discharge: 2010-09-30 | Payer: Self-pay | Source: Home / Self Care | Attending: Internal Medicine | Admitting: Internal Medicine

## 2010-10-06 ENCOUNTER — Ambulatory Visit (HOSPITAL_COMMUNITY)
Admission: RE | Admit: 2010-10-06 | Discharge: 2010-10-06 | Payer: Self-pay | Source: Home / Self Care | Attending: Radiation Oncology | Admitting: Radiation Oncology

## 2010-10-09 ENCOUNTER — Encounter: Payer: Self-pay | Admitting: Radiation Oncology

## 2010-10-09 ENCOUNTER — Encounter: Payer: Self-pay | Admitting: Internal Medicine

## 2010-10-12 ENCOUNTER — Inpatient Hospital Stay (HOSPITAL_COMMUNITY)
Admission: EM | Admit: 2010-10-12 | Discharge: 2010-10-14 | Payer: Self-pay | Source: Home / Self Care | Attending: Cardiology | Admitting: Cardiology

## 2010-10-12 ENCOUNTER — Emergency Department (HOSPITAL_COMMUNITY)
Admission: EM | Admit: 2010-10-12 | Discharge: 2010-10-12 | Payer: Self-pay | Source: Home / Self Care | Admitting: Emergency Medicine

## 2010-10-12 LAB — CBC
HCT: 41.9 % (ref 36.0–46.0)
HCT: 39.6 % (ref 36.0–46.0)
MCH: 28.7 pg (ref 26.0–34.0)
MCV: 89.7 fL (ref 78.0–100.0)
Platelets: 235 10*3/uL (ref 150–400)
RBC: 4.67 MIL/uL (ref 3.87–5.11)
RDW: 13.3 % (ref 11.5–15.5)
WBC: 12.1 10*3/uL — ABNORMAL HIGH (ref 4.0–10.5)
WBC: 9.2 10*3/uL (ref 4.0–10.5)

## 2010-10-12 LAB — DIFFERENTIAL
Basophils Absolute: 0 10*3/uL (ref 0.0–0.1)
Eosinophils Absolute: 0.1 10*3/uL (ref 0.0–0.7)
Eosinophils Relative: 1 % (ref 0–5)
Lymphocytes Relative: 8 % — ABNORMAL LOW (ref 12–46)
Lymphocytes Relative: 12 % (ref 12–46)
Lymphs Abs: 0.9 10*3/uL (ref 0.7–4.0)
Monocytes Relative: 5 % (ref 3–12)
Neutro Abs: 7.3 10*3/uL (ref 1.7–7.7)
Neutrophils Relative %: 86 % — ABNORMAL HIGH (ref 43–77)
Neutrophils Relative %: 79 % — ABNORMAL HIGH (ref 43–77)

## 2010-10-12 LAB — POCT CARDIAC MARKERS
Troponin i, poc: <0.05 ng/mL (ref 0.00–0.09)
Troponin i, poc: 0.11 ng/mL — ABNORMAL HIGH (ref 0.00–0.09)

## 2010-10-12 LAB — BASIC METABOLIC PANEL
BUN: 13 mg/dL (ref 6–23)
CO2: 28 mEq/L (ref 19–32)
Chloride: 102 mEq/L (ref 96–112)
Creatinine, Ser: 0.98 mg/dL (ref 0.4–1.2)

## 2010-10-12 LAB — POCT I-STAT, CHEM 8
Glucose, Bld: 121 mg/dL — ABNORMAL HIGH (ref 70–99)
HCT: 39 % (ref 36.0–46.0)
Hemoglobin: 13.3 g/dL (ref 12.0–15.0)
Potassium: 3.7 mEq/L (ref 3.5–5.1)
Sodium: 144 mEq/L (ref 135–145)

## 2010-10-12 LAB — COMPREHENSIVE METABOLIC PANEL
Albumin: 3.6 g/dL (ref 3.5–5.2)
Alkaline Phosphatase: 79 U/L (ref 39–117)
BUN: 16 mg/dL (ref 6–23)
CO2: 26 mEq/L (ref 19–32)
Chloride: 104 mEq/L (ref 96–112)
Creatinine, Ser: 1 mg/dL (ref 0.4–1.2)
GFR calc non Af Amer: 55 mL/min — ABNORMAL LOW (ref >60)
Glucose, Bld: 136 mg/dL — ABNORMAL HIGH (ref 70–99)
Potassium: 4 mEq/L (ref 3.5–5.1)
Total Bilirubin: 0.3 mg/dL (ref 0.3–1.2)

## 2010-10-12 LAB — BRAIN NATRIURETIC PEPTIDE: Pro B Natriuretic peptide (BNP): 45 pg/mL (ref 0.0–100.0)

## 2010-10-12 LAB — CK TOTAL AND CKMB (NOT AT ARMC): Total CK: 227 U/L — ABNORMAL HIGH (ref 7–177)

## 2010-10-12 LAB — D-DIMER, QUANTITATIVE: D-Dimer, Quant: 0.33 ug/mL-FEU (ref 0.00–0.48)

## 2010-10-12 LAB — TROPONIN I: Troponin I: 0.12 ng/mL — ABNORMAL HIGH (ref 0.00–0.06)

## 2010-10-12 LAB — PROTIME-INR: INR: 1.01 (ref 0.00–1.49)

## 2010-10-13 LAB — CARDIAC PANEL(CRET KIN+CKTOT+MB+TROPI)
CK, MB: 13.2 ng/mL (ref 0.3–4.0)
Relative Index: 5.5 — ABNORMAL HIGH (ref 0.0–2.5)
Total CK: 242 U/L — ABNORMAL HIGH (ref 7–177)
Troponin I: 0.13 ng/mL — ABNORMAL HIGH (ref 0.00–0.06)

## 2010-10-13 NOTE — H&P (Addendum)
NAME:  [name], FRONEK                ACCOUNT NO.:  [id number]  MEDICAL RECORD NO.:  [id number]          PATIENT TYPE:  EMS  LOCATION:  [facility]                         FACILITY:  [facility]  PHYSICIAN:  Colleen Can. Deborah Chalk, M.D.DATE OF BIRTH:  08/25/1936  DATE OF ADMISSION:  10/12/2010 DATE OF DISCHARGE:                             HISTORY & PHYSICAL   CHIEF COMPLAINTS:  Chest pain and shortness of breath.  HISTORY OF PRESENT ILLNESS:  The patient is a 75 year old white female with past medical history significant for COPD on home oxygen who presented down twice to the emergency room today with complaints of chest pain and shortness of breath.  Earlier on today, she presented with a several-hour history of chest pain and shortness of breath.  She stated the pain radiated to her arms and was associated with fatigue and difficulty breathing.  She was found to be in SVT which was treated with adenosine.  She was discharged home.  At home, she had return of the same symptoms and she returned back to the emergency room via EMS.  EMS found her to be in a narrow-complex tachycardia and tried several doses of adenosine 12 mg without any change in her rhythm.  In the emergency room, she was found to have a heart rate approximately 210 beats per minute.  It was a narrow-complex tachycardia.  Her blood pressure upon my evaluation was 148/110.  I gave her Cardizem 20 mg IV x1 and she had immediate return to normal sinus rhythm with a heart rate in the 90s. She also had resolution of her symptoms.  PAST MEDICAL HISTORY:  COPD on home oxygen.  She also has a history of left-sided pneumothorax, left upper lobe lesion, obstructive sleep apnea, asthma.  SOCIAL HISTORY:  History of tobacco but no longer smokes, does not use alcohol.  The patient lives at home.  FAMILY HISTORY:  Noncontributory.  ALLERGIES:  CODEINE.  MEDICATIONS:  The patient is unclear as to the dose of her medications, but she states  that she takes albuterol inhalers, prednisone 5 mg daily, theophylline, Premarin.  REVIEW OF SYSTEMS:  Positive for fatigue, shortness of breath requiring albuterol nebulizers which she uses may be on a daily basis.  Of note, she did use albuterol this morning after the onset of these symptoms. Other systems as in HPI are otherwise negative.  PHYSICAL EXAMINATION:  VITAL SIGNS:  Temperature 98.4, heart rate is 216, blood pressure 148/104, sating 98% on 2 L. GENERAL:  No acute distress. HEENT:  Normocephalic, atraumatic. NECK:  Supple. HEART:  Tachycardic and regular without murmur, rub, or gallop. LUNGS:  Mild bilateral wheezing. ABDOMEN:  Soft, nontender, nondistended. EXTREMITIES:  Without edema. SKIN:  Cool and dry. NEUROLOGIC:  Nonfocal. MUSCULOSKELETAL:  Bilateral upper and lower extremity strength 5/5. PSYCHIATRIC:  The patient is appropriate.  LABORATORY DATA:  BMP is unremarkable.  CBC is unremarkable.  Troponin is less than 0.05, CK-MB is 4.7.  EKG independently interpreted myself demonstrates a narrow-complex tachycardia consistent with SVT at a rate of 216 beats per minute.  EKG with a normal rate is consistent with an atrial rhythm,  possibly wandering atrial pacemaker as there appeared to be multiple P-wave morphology.  This does raise the question of multifocal atrial tachycardia.  Chest x-ray, no acute process, otherwise bullous emphysema.  ASSESSMENT:  Supraventricular tachycardia that was responsive to Cardizem.  PLAN:  We will start the patient on Cardizem 30 mg p.o. q.6, which could be titrated up as needed.  She will be ruled out for myocardial infarction, although I think this is an unlikely diagnosis.  We will check a transthoracic echocardiogram as well as a TSH.  She will be placed on Lovenox for DVT prophylaxis.  Her home medication should be brought in tomorrow by her husband, but for now I will hold off on these as we do not know the doses.  It may be  wise to change her albuterol to Atrovent as an outpatient.     Brayton El, MD   ______________________________ Colleen Can. Deborah Chalk, M.D.    SGA/MEDQ  D:  10/12/2010  T:  10/12/2010  Job:  [id number]  Electronically Signed by Roger Shelter M.D. on 10/13/2010 03:39:47 PM Electronically Signed by Raynelle Bring MD on 11/18/2010 10:34:32 AM

## 2010-10-14 ENCOUNTER — Telehealth (INDEPENDENT_AMBULATORY_CARE_PROVIDER_SITE_OTHER): Payer: Self-pay | Admitting: *Deleted

## 2010-10-18 ENCOUNTER — Ambulatory Visit
Admission: RE | Admit: 2010-10-18 | Discharge: 2010-10-18 | Payer: Self-pay | Source: Home / Self Care | Attending: Internal Medicine | Admitting: Internal Medicine

## 2010-10-18 NOTE — Assessment & Plan Note (Signed)
Summary: xolair/apc   Nurse Visit   Allergies: 1)  Biaxin  Medication Administration  Injection # 1:    Medication: Xolair (omalizumab) 150mg     Diagnosis: EXTRINSIC ASTHMA, UNSPECIFIED (ICD-493.00)    Route: SQ    Site: R deltoid    Exp Date: 01/16/2013    Lot #: [id number]    Mfr: GENENTECH    Comments: 0.9ML IN RIGHT ARM AND 0.9ML IN LEFT ARM PT WAITED 10 MINS    Patient tolerated injection without complications    Given by: SUSANNE FORD IN ALLERGY LAB  Orders Added: 1)  Xolair (omalizumab) 150mg  [J2357] 2)  Administration xolair injection [id number]   Medication Administration  Injection # 1:    Medication: Xolair (omalizumab) 150mg     Diagnosis: EXTRINSIC ASTHMA, UNSPECIFIED (ICD-493.00)    Route: SQ    Site: R deltoid    Exp Date: 01/16/2013    Lot #: [id number]    Mfr: GENENTECH    Comments: 0.9ML IN RIGHT ARM AND 0.9ML IN LEFT ARM PT WAITED 10 MINS    Patient tolerated injection without complications    Given by: SUSANNE FORD IN [facility]ERGY LAB  Orders Added: 1)  Xolair (omalizumab) 150mg  [J2357] 2)  Administration xolair injection [[id number]]

## 2010-10-18 NOTE — Letter (Signed)
[facility]   Imported By: Lester [place] 06/09/2010 11:07:40  _____________________________________________________________________  External Attachment:    Type:   Image     Comment:   External Document

## 2010-10-18 NOTE — Assessment & Plan Note (Signed)
Summary: xolair/apc   Nurse Visit   Allergies: 1)  Biaxin  Medication Administration  Injection # 1:    Medication: Xolair (omalizumab) 150mg     Diagnosis: EXTRINSIC ASTHMA, UNSPECIFIED (ICD-493.00)    Route: SQ    Site: L deltoid    Exp Date: 01/16/2013    Lot #: [id number]    Mfr: GENENTECH    Comments: 0.9 ML IN LEFT AND RIGHT ARM PT DIDNT WAIT    Given by: TAMMY SCOTT IN ALLERGY LAB  Orders Added: 1)  Xolair (omalizumab) 150mg  [J2357] 2)  Administration xolair injection [id number]   Medication Administration  Injection # 1:    Medication: Xolair (omalizumab) 150mg     Diagnosis: EXTRINSIC ASTHMA, UNSPECIFIED (ICD-493.00)    Route: SQ    Site: L deltoid    Exp Date: 01/16/2013    Lot #: [id number]    Mfr: GENENTECH    Comments: 0.9 ML IN LEFT AND RIGHT ARM PT DIDNT WAIT    Given by: TAMMY SCOTT IN [facility]ERGY [facility]  Orders Added: 1)  Xolair (omalizumab) 150mg  [J2357] 2)  Administration xolair injection [[id number]]

## 2010-10-18 NOTE — Assessment & Plan Note (Signed)
Summary: xolair/ mbw   Nurse Visit   Allergies: 1)  Biaxin  Medication Administration  Injection # 1:    Medication: Xolair (omalizumab) 150mg     Diagnosis: EXTRINSIC ASTHMA, UNSPECIFIED (ICD-493.00)    Route: IM    Site: R deltoid    Exp Date: 06/18/2013    Lot #: [id number]    Mfr: Genetech    Comments: xolair 225 mg, 60 units, 0.9 ml x 1 in right deltoid and 0.9 ml x 1 in left deltoid pt didn't wait.    Given by: Drucie Opitz, CMA (AAMA)  Orders Added: 1)  Xolair (omalizumab) 150mg  [J2357] 2)  Administration xolair injection [phone]

## 2010-10-18 NOTE — Letter (Signed)
[facility]   Imported By: Sherian Rein 02/11/2010 11:17:33  _____________________________________________________________________  External Attachment:    Type:   Image     Comment:   External Document

## 2010-10-18 NOTE — Letter (Signed)
[facility]   Imported By: Lester Copan 12/22/2009 10:47:00  _____________________________________________________________________  External Attachment:    Type:   Image     Comment:   External Document

## 2010-10-18 NOTE — Letter (Signed)
Summary: [facility]   Imported By: Lester [place] 02/25/2010 09:29:49  _____________________________________________________________________  External Attachment:    Type:   Image     Comment:   External Document

## 2010-10-18 NOTE — Assessment & Plan Note (Signed)
Summary: [facility]   Nurse Visit   Allergies: 1)  Biaxin  Medication Administration  Injection # 1:    Medication: Xolair (omalizumab) 150mg     Diagnosis: EXTRINSIC ASTHMA, UNSPECIFIED (ICD-493.00)    Route: SQ    Site: R deltoid    Exp Date: 06/2013    Lot #: [id number]    Mfr: Salome Spotted    Comments: 0.9 ML IN RIGHT AND LEFT ARM 225MG  PT WAITED 10 MINS CHARGED [id number], [id number], [id number]    Patient tolerated injection without complications    Given by: SUSANNE FORD IN [facility]ERGY LAB  Orders Added: 1)  Xolair (omalizumab) 150mg  [J2357] 2)  Xolair (omalizumab) 150mg  [J2357] 3)  Administration xolair injection [id number]   Medication Administration  Injection # 1:    Medication: Xolair (omalizumab) 150mg     Diagnosis: EXTRINSIC ASTHMA, UNSPECIFIED (ICD-493.00)    Route: SQ    Site: R deltoid    Exp Date: 06/2013    Lot #: [id number]    Mfr: Salome Spotted    Comments: 0.9 ML IN RIGHT AND LEFT ARM 225MG  PT WAITED 10 MINS CHARGED [id number], [id number], [id number] 3    Patient tolerated injection without complications    Given by: SUSANNE FORD IN [facility]ERGY LAB  Orders Added: 1)  Xolair (omalizumab) 150mg  [J2357] 2)  Xolair (omalizumab) 150mg  [J2357] 3)  Administration xolair injection [[id number]]

## 2010-10-18 NOTE — Progress Notes (Signed)
Summary: rx req/ wheez/ cough  Phone Note Call from Patient   Caller: Patient Call For: young Summary of Call: pt c/o cough w/ greenish /yellow phlegm; wheezing x 3 days. also low-grade fever. aches "all over". pt has taken mucinex and lots of fluids. requests rx called in. doesn't feel well enough to be seen in office. call pt at [phone] (pt says the above home # is valid but wants to be called back at the # i have given you. pharm: [facility] on [address] Initial call taken by: Tivis Ringer, CNA,  October 12, 2009 9:04 AM  Follow-up for Phone Call        Allergies:  Biaxin.   Please advise.  Thank you.  Aundra Millet Reynolds LPN  October 12, 2009 9:10 AM    *************************    SICK   *************************  Additional Follow-up for Phone Call Additional follow up Details #1::        Offer doxycycline 100 mg, # 8, 2 today then one daily. Ask if she thinks she needs anything else. Additional Follow-up by: Waymon Budge MD,  October 12, 2009 9:20 AM    Additional Follow-up for Phone Call Additional follow up Details #2::    called and spoke with pt.  pt would like something for cough as well.  Please advise. Pt aware rx sent to pharmacy.  Aundra Millet Reynolds LPN  October 12, 2009 9:25 AM   Additional Follow-up for Phone Call Additional follow up Details #3:: Details for Additional Follow-up Action Taken: I put hydromet cough syrup on her med list. please send.   Additional Follow-up by: Waymon Budge MD,  October 12, 2009 1:24 PM  New/Updated Medications: HYDROMET 5-1.5 MG/5ML SYRP (HYDROCODONE-HOMATROPINE) 1 teaspoon four times a day as needed cough DOXYCYCLINE HYCLATE 100 MG CAPS (DOXYCYCLINE HYCLATE) take 2 tabs today and then 1 daily until gone. Prescriptions: HYDROMET 5-1.5 MG/5ML SYRP (HYDROCODONE-HOMATROPINE) 1 teaspoon four times a day as needed cough  #200 ml x 0   Entered by:   Arman Filter LPN   Authorized by:   Waymon Budge MD   Signed by:   Arman Filter LPN on 82/95/6213   Method used:   Telephoned to ...       [facility]. [phone]* (retail)       [address]       [place], [place]  [address]       Ph: [phone]       Fax: [phone]   RxID:   [id number] DOXYCYCLINE HYCLATE 100 MG CAPS (DOXYCYCLINE HYCLATE) take 2 tabs today and then 1 daily until gone.  #8 x 0   Entered by:   Arman Filter LPN   Authorized by:   Waymon Budge MD   Signed by:   Arman Filter LPN on 56/43/3295   Method used:   Telephoned to ...       [facility]. [phone]* (retail)       [address]       [place], [place]  [address]       Ph: [phone]       Fax: [phone]   RxID:   [id number] HYDROMET 5-1.5 MG/5ML SYRP (HYDROCODONE-HOMATROPINE) 1 teaspoon four times a day as needed cough  #200 ml x 0   Entered by:   Waymon Budge MD   Authorized by:  Pulmonary Triage   Signed by:   Waymon Budge MD on 10/12/2009   Method used:   Historical   RxID:   [id number]

## 2010-10-18 NOTE — Assessment & Plan Note (Signed)
Summary: XOLAIR/KLW   Nurse Visit   Allergies: 1)  Biaxin  Medication Administration  Injection # 1:    Medication: Xolair (omalizumab) 150mg     Diagnosis: EXTRINSIC ASTHMA, UNSPECIFIED (ICD-493.00)    Route: IM    Site: R deltoid    Exp Date: 06/18/2013    Lot #: [id number]    Mfr: Salome Spotted    Comments: xolair 225 mg and 60 units 0.9 ml x 1 in Right deltoid and 0.9 ml Left deltoid. Pt didn't wait.    Given by: Dimas Millin, Allergy tech.  Orders Added: 1)  Xolair (omalizumab) 150mg  [J2357] 2)  Administration xolair injection [phone]

## 2010-10-18 NOTE — Assessment & Plan Note (Signed)
Summary: xolair/ mbw   Nurse Visit   Allergies: 1)  Biaxin  Medication Administration  Injection # 1:    Medication: Xolair (omalizumab) 150mg     Diagnosis: EXTRINSIC ASTHMA, UNSPECIFIED (ICD-493.00)    Route: SQ    Site: R deltoid    Exp Date: 01/16/2013    Lot #: [id number]    Mfr: GENENTECH    Comments: 1.9 ML IN RIGHT AND LEFT ARM PT DIDNT WAIT CHARGED [id number] AND [id number]    Given by: Dimas Millin IN ALLERGY LAB   Medication Administration  Injection # 1:    Medication: Xolair (omalizumab) 150mg     Diagnosis: EXTRINSIC ASTHMA, UNSPECIFIED (ICD-493.00)    Route: SQ    Site: R deltoid    Exp Date: 01/16/2013    Lot #: [id number]    Mfr: GENENTECH    Comments: 1.9 ML IN RIGHT AND LEFT ARM PT DIDNT WAIT CHARGED [id number] AND [id number]    Given by: TAMMY SCOTT IN ALLERGY LAB

## 2010-10-18 NOTE — Assessment & Plan Note (Signed)
Summary: xiolair/apc   Nurse Visit   Allergies: 1)  Biaxin  Medication Administration  Injection # 1:    Medication: Xolair (omalizumab) 150mg     Diagnosis: EXTRINSIC ASTHMA, UNSPECIFIED (ICD-493.00)    Route: SQ    Site: R deltoid    Exp Date: 01/16/2013    Lot #: [id number]    Mfr: GENENTECH    Comments: 0.9 ML X RIGHT AND 0.9 ML IN LEFT ARM PT WAITED    Patient tolerated injection without complications    Given by: Glade Lloyd IN ALLERGY LAB  Orders Added: 1)  Xolair (omalizumab) 150mg  [J2357] 2)  Administration xolair injection [[id number]]   Medication Administration  Injection # 1:    Medication: Xolair (omalizumab) 150mg     Diagnosis: EXTRINSIC ASTHMA, UNSPECIFIED (ICD-493.00)    Route: SQ    Site: R deltoid    Exp Date: 01/16/2013    Lot #: [id number]    Mfr: GENENTECH    Comments: 0.9 ML X RIGHT AND 0.9 ML IN LEFT ARM PT WAITED    Patient tolerated injection without complications    Given by: Rosalita Chessman FORD IN ALLERGY LAB  Orders Added: 1)  Xolair (omalizumab) 150mg  [J2357] 2)  Administration xolair injection [[id number]]

## 2010-10-18 NOTE — Assessment & Plan Note (Signed)
Summary: rov/jd   Primary Provider/Referring Provider:  Burney Gauze  CC:  Follow up visit-asthma and allergies; Still having SOB. [name]  History of Present Illness:  June 29, 2009- Asthma, COPD, OSA, Lung nodule, PET POS No more hemoptysis after last here, but had watery bloody nose for a day or so. Today feels a little shakey- blames not getting a neb today. Took penicillin for dental work. Now 3 months since she saw the Dr at [facility] and is calling to try to arrange f/u CT there. She would like to know dx of her nodule even though she isn't a surgical candidate, so she is willing to have another needle bx even though the first caused PTX/ hospitalization. Got flu shot. Conitinues CPAP.  Jan 25, 2010-Asthma, COPD/ Respiratory failure. OSA. Lung Nodule/XRT Had been stable through the winter with help from Xolair. 3 weeks ago returned from beach with earache, cough, fever, some green. We called amoxacilin which helped- ends tomorrow. With this her oxygen saturation has been lower and she has felt weak. Denies chest pain, palpitation, edema. On arrival today on 3l pulse regulator her sat was 72%. With rest and switch  to 3 L continuous, sat is now 93%.  She had 5 XRT treatments by Dr Kathrynn Running for lung nodule- had been unable to tolerate bx procedure so no tissue dx.  May 30, 2010-  She drove here today having forgotten her portable oxygen because she stays on concentrator at home. Saturation was 93% on our O2 at 3 L- not recorded on room air. She stays on 2-3 L at home. She has been comfortable with a little self limited cough but no major flares through the summer. She stays on 5 mg prednisone daily. When she was coughing a couple of weeks ago she took 10 mg for a few days. Denies chest pain., spalpitation, swelling, glands, fever, chills or blood. contiues Xolair and we think that is likely part of the reason she has been more stable. Using rescue inhaler if out more with more exertion. Had XRT  by Dr Kathrynn Running who is following CT with repeated CT planned in 6 months.  Asthma History    Initial Asthma Severity Rating:    Age range: 12+ years    Symptoms: >2 days/week; not daily    Nighttime Awakenings: 0-2/month    Interferes w/ normal activity: some limitations    SABA use (not for EIB): >2 days/week but not >1X/day    Asthma Severity Assessment: Moderate Persistent   Preventive Screening-Counseling & Management  Alcohol-Tobacco     Smoking Status: quit     Year Quit: 1999     Pack years: 27yrs, 2ppd  Current Medications (verified): 1)  Theophylline Cr 300 Mg Xr12h-Tab (Theophylline) .... Take 1/2  Tablet By Mouth Two Times A Day 2)  Advair Diskus 250-50 Mcg/dose  Misc (Fluticasone-Salmeterol) .... Inhale 1 Puff Two Times A Day 3)  Duoneb 2.5-0.5 Mg/41ml  Soln (Albuterol-Ipratropium) .... Inhale 1 Vial [address] [facility] Two Times A Day 4)  Singulair 10 Mg  Tabs (Montelukast Sodium) .... Take 1 Tablet By Mouth Once A Day 5)  Albuterol 90 Mcg/act  Aers (Albuterol) .... Inhale 2 Puffs Every 4 Hrs As Needed 6)  Spiriva Handihaler 18 Mcg Caps (Tiotropium Bromide Monohydrate) .[name].. 1 Daily 7)  Fluticasone Propionate 50 Mcg/act Susp (Fluticasone Propionate) .[name].. 1-2 Puffs Each Nostril Daily 8)  Prednisone 5 Mg Tabs (Prednisone) .[name].. 1 Daily or As Directed 9)  Allergy Vaccine 1:10 Gh 10)  Cpap .[name].. 8 Cm H2o Pressure 11)  Oxygen 2-3 L/m 12)  Xolair 150 Mg Solr (Omalizumab) .... 225 Mg Im Every 2 Weeks 13)  Epipen 2-Pak 0.3 Mg/0.72ml (1:1000) Devi (Epinephrine Hcl (Anaphylaxis)) .... Use As Directed 14)  Mucinex 600 Mg  Tb12 (Guaifenesin) .... As Needed 15)  Budeprion Sr 150 Mg  Tb12 (Bupropion Hcl) .... Take 1 Tablet By Mouth Two Times A Day 16)  Synthroid 75 Mcg  Tabs (Levothyroxine Sodium) .... Take 1 Tablet By Mouth Once A Day 17)  Premarin 0.625 Mg  Tabs (Estrogens Conjugated) .... Take 1 Tablet By Mouth Once A Day 18)  Actonel 35 Mg  Tabs (Risedronate Sodium) .... Take 1 Tablet Once A  Week  Allergies (verified): 1)  Biaxin  Past History:  Past Medical History: Last updated: 03/01/2009 RHINOSINUSITIS, RECURRENT (ICD-473.9) OBSTRUCTIVE SLEEP APNEA (ICD-327.23) COPD (ICD-496) ASTHMA (ICD-493.90) ALLERGIC RHINITIS (ICD-477.9) Lung nodule    Past Surgical History: Last updated: 03/01/2009 Total Abdominal Hysterectomy Tonsillectomy Cyst from scalp bladder tack Appendectomy Needle bx lung nodule- atypical cells- complicated by PTX/ chest tube.  Family History: Last updated: 03-17-2008 Mother- deceased age 10; heart disease Father- deceased age 57 2 Siblings-living ages 70,76  Social History: Last updated: 03/17/2008 Patient states former smoker.  Quit 1988. Retired Married with 2 children  Risk Factors: Smoking Status: quit (05/30/2010)  Review of Systems      See HPI       The patient complains of shortness of breath with activity.  The patient denies shortness of breath at rest, productive cough, non-productive cough, coughing up blood, chest pain, irregular heartbeats, acid heartburn, indigestion, loss of appetite, weight change, abdominal pain, difficulty swallowing, sore throat, tooth/dental problems, headaches, nasal congestion/difficulty breathing through nose, and sneezing.    Vital Signs:  Patient profile:   75 year old female Height:      65 inches Weight:      170.38 pounds BMI:     28.46 O2 Sat:      93 % on 3 L/min Pulse rate:   107 / minute BP sitting:   172 / 70  (left arm) Cuff size:   regular  Vitals Entered By: Reynaldo Minium CMA (May 30, 2010 3:07 PM)  O2 Flow:  3 L/min CC: Follow up visit-asthma and allergies; Still having SOB.  Comments Pt came in today without O2.  PT given extra tank from [facility] supply at our office to take home.   Lecretia at [facility] notified. Abigail Miyamoto RN  May 30, 2010 3:53 PM    Physical Exam  Additional Exam:  General: A/Ox3; pleasant and cooperative, NAD, chronic nasal speech. Wearing  supplemental oxygen. Sat 93% on 3 L Looks comfortable Skin: No rash Nodes: None found enlarged HEENT: [place]/AT, EOM- WNL, Conjuctivae- clear, PERRLA, TM-WNL, Nose- clear, Throat- clear and wnl, chronic nasal sounding speech. NECK: Supple w/ fair ROM, JVD- none, normal carotid impulses w/o bruits Thyroid-  CHEST: No cough, minimal end exopiratory wheeze in left mid lung, unlabored HEART: RRR, no m/g/r heard, mild regular tachycardia Abdomen- modestly overweight ZOX:WRUE, nl pulses, no edema  NEURO: Grossly intact to observation      Impression & Recommendations:  Problem # 1:  OBSTRUCTIVE SLEEP APNEA (ICD-327.23)  Continues PAP at 8 with good compliance and control  Problem # 2:  LUNG NODULE (ICD-518.89)  Being treated empiraically because she couldn't tolerate a biospy procedure. We will look to long term response to her XRT.  Problem # 3:  EXTRINSIC ASTHMA,  UNSPECIFIED (ICD-493.00) Severe chronic obstructive asthma. Xolair and maintenance prednisone have both seemed to help. She is much more stable this year. She remains oxygen dependent with limited exercise capacity.  Other Orders: Est. Patient Level IV (54098)  Patient Instructions: 1)  Please schedule a follow-up appointment in 6 months. 2)  Flu vax

## 2010-10-18 NOTE — Miscellaneous (Signed)
Summary: [facility]  [facility]   Imported By: Lennie Odor 07/26/2010 15:10:45  _____________________________________________________________________  External Attachment:    Type:   Image     Comment:   External Document

## 2010-10-18 NOTE — Progress Notes (Signed)
Summary: nos appt  Phone Note Call from Patient   Caller: [email or web address]  Call For: young Summary of Call: Rsc nos from 9/8 to 9/12 @ 2:45p. Initial call taken by: Darletta Moll,  May 27, 2010 9:52 AM

## 2010-10-18 NOTE — Letter (Signed)
[facility]   Imported By: Sherian Rein 04/28/2010 13:57:22  _____________________________________________________________________  External Attachment:    Type:   Image     Comment:   External Document

## 2010-10-18 NOTE — Assessment & Plan Note (Signed)
Summary: xolair///kp   Nurse Visit   Allergies: 1)  Biaxin  Medication Administration  Injection # 1:    Medication: Xolair (omalizumab) 150mg     Diagnosis: 493.00    Route: SQ    Site: R deltoid    Exp Date: 03/2013    Lot #: [id number]    Mfr: Salome Spotted    Comments: 0.9 ML IN RIGHT AND LEFT ARM PT WAITED 20 MINS    Patient tolerated injection without complications    Given by: SUSANNE FORD IN ALLERGY LAB   Medication Administration  Injection # 1:    Medication: Xolair (omalizumab) 150mg     Diagnosis: 493.00    Route: SQ    Site: R deltoid    Exp Date: 03/2013    Lot #: [id number]    Mfr: Salome Spotted    Comments: 0.9 ML IN RIGHT AND LEFT ARM PT WAITED 20 MINS    Patient tolerated injection without complications    Given by: SUSANNE FORD IN ALLERGY LAB

## 2010-10-18 NOTE — Assessment & Plan Note (Signed)
Summary: xolair/apc   Nurse Visit   Allergies: 1)  Biaxin  Medication Administration  Injection # 1:    Medication: Xolair (omalizumab) 150mg     Diagnosis: EXTRINSIC ASTHMA, UNSPECIFIED (ICD-493.00)    Route: SQ    Site: L deltoid    Exp Date: 03/18/2013    Lot #: [id number]    Mfr: Salome Spotted    Comments: 0.9 ML IN LEFT AND RIGHT ARM PT DIDNT WAIT CHARGED [id number] X 45, [id number] ,[id number]     Given by: Dimas Millin IN ALLERGY LAB   Medication Administration  Injection # 1:    Medication: Xolair (omalizumab) 150mg     Diagnosis: EXTRINSIC ASTHMA, UNSPECIFIED (ICD-493.00)    Route: SQ    Site: L deltoid    Exp Date: 03/18/2013    Lot #: [id number]    Mfr: Salome Spotted    Comments: 0.9 ML IN LEFT AND RIGHT ARM PT DIDNT WAIT CHARGED J2357 X 45, [id number] ,[id number]     Given by: Dimas Millin IN ALLERGY LAB

## 2010-10-18 NOTE — Assessment & Plan Note (Signed)
Summary: xolair///kp   Nurse Visit   Allergies: 1)  Biaxin  Medication Administration  Injection # 1:    Medication: Xolair (omalizumab) 150mg     Diagnosis: EXTRINSIC ASTHMA, UNSPECIFIED (ICD-493.00)    Route: SQ    Site: R deltoid    Exp Date: 11/2012    Lot #: [id number]    Mfr: Mendel Ryder    Comments: Injection given by Dimas Millin in allergy lab. Xolair 225mg . 0.72ml x 1 in Right and Left Deltoid. Pt did not wait.   Orders Added: 1)  Admin of Therapeutic Inj  intramuscular or subcutaneous [96372] 2)  Xolair (omalizumab) 150mg  [J2357]

## 2010-10-18 NOTE — Assessment & Plan Note (Signed)
Summary: xolair/apc   Nurse Visit   Allergies: 1)  Biaxin  Medication Administration  Injection # 1:    Medication: Xolair (omalizumab) 150mg     Diagnosis: EXTRINSIC ASTHMA, UNSPECIFIED (ICD-493.00)    Route: SQ    Site: R deltoid    Exp Date: 09/2012    Lot #: [id number]    Mfr: Mendel Ryder    Comments: Injetction given by Dimas Millin in allergy lab. Xolair 225mg . 0.30ml x 1 in Right and Left Deltoid. Pt did not wait.   Orders Added: 1)  Xolair (omalizumab) 150mg  [J2357] 2)  Admin of Therapeutic Inj  intramuscular or subcutaneous [[id number]]

## 2010-10-18 NOTE — Progress Notes (Signed)
Summary: ? Peak flow meter/ATC x 5  Phone Note Call from Patient Call back at Home Phone [phone]   Caller: Patient Call For: Young Summary of Call: Pt came in for Xolair; stating that she would like to know if a peak flow meter would help her. Unsure if this is actually what she is talking about. Initial call taken by: Tammy Scott  Follow-up for Phone Call        ATC pt at home number; unable to leave message; need more information of what pt is referring to.   ATC pt  but NA and no machine to lve msg   Philipp Deputy [facility]  March 23, 2010 3:01 PM   ATC pt but NA and no machine to leave a message. Zackery Barefoot CMA  March 24, 2010 9:57 AM   ATC pt and still NA and unable to leave msg- no answering machine. Vernie Murders  March 24, 2010 1:12 PM  Additional Follow-up for Phone Call Additional follow up Details #1::        ATC pt x1. NA and no machine to leave a msg. We have tried several times to reach this patient and have been unsuccessful.  The patient will have to call if she still has a question for the nurse or doctor. Additional Follow-up by: Michel Bickers CMA,  March 25, 2010 8:58 AM

## 2010-10-18 NOTE — Progress Notes (Signed)
Summary: On call- augmentin  Phone Note Call from Patient   Summary of Call: On call- Patient went to ER yesterday for dyspnea. Given neb and pred. Today reports fever, head cold. Asks augmentin. Plan- augmentin 875 mg two times a day x 7 days, called to [facility] [phone]. Initial call taken by: Waymon Budge MD,  September 18, 2009 9:30 PM

## 2010-10-18 NOTE — Assessment & Plan Note (Signed)
Summary: xoliar/cb   Nurse Visit   Allergies: 1)  Biaxin  Medication Administration  Injection # 1:    Medication: Xolair (omalizumab) 150mg     Diagnosis: EXTRINSIC ASTHMA, UNSPECIFIED (ICD-493.00)    Route: IM    Site: R deltoid    Exp Date: 04/18/2013    Lot #: [id number]    Mfr: Genetech    Comments: xolair 225 mg,60 units, 0.39ml x1 in right deltoid, 0.9 ml x 1 in left deltoid. pt waited 20 mins.    Given by: Carver Fila, CMA  Orders Added: 1)  Xolair (omalizumab) 150mg  [J2357] 2)  Administration xolair injection [[id number]]   Medication Administration  Injection # 1:    Medication: Xolair (omalizumab) 150mg     Diagnosis: EXTRINSIC ASTHMA, UNSPECIFIED (ICD-493.00)    Route: IM    Site: R deltoid    Exp Date: 04/18/2013    Lot #: [id number]    Mfr: Genetech    Comments: xolair 225 mg,60 units, 0.66ml x1 in right deltoid, 0.9 ml x 1 in left deltoid. pt waited 20 mins.    Given by: Carver Fila, CMA  Orders Added: 1)  Xolair (omalizumab) 150mg  [J2357] 2)  Administration xolair injection [phone]

## 2010-10-18 NOTE — Assessment & Plan Note (Signed)
Summary: cough/wheezing/inc sob/sats with exertion 78 to 81/mg   Primary Provider/Referring Provider:  Burney Gauze  CC:  Accute visit-SOB; wheezing and low O2 levels; dry cough. 1 day left of Amoxicillin.Marland Kitchen  History of Present Illness: 06/02/09- Asthma, COPD, OSA, Lung nodule, PET Pos hemoptysis bright red < 1 teaspoon x 1 this AM. Had been feeling tight and short of breath without definite fever, purulent, pain for about a week. Finished zpak 10 days ago. No blood thinners. She has had several Imaging procedures. Dr Edwyna Shell sent her to Dr Raina Mina at [facility] who felt biopsy was too difficult (2 months ago) and suggested therapeutic and diagnostic surgery. Xolair has seemd to reduce her need for antibiotics.  June 29, 2009- Asthma, COPD, OSA, Lung nodule, PET POS No more hemoptysis after last here, but had watery bloody nose for a day or so. Today feels a little shakey- blames not getting a neb today. Took penicillin for dental work. Now 3 months since she saw the Dr at [facility] and is calling to try to arrange f/u CT there. She would like to know dx of her nodule even though she isn't a surgical candidate, so she is willing to have another needle bx even though the first caused PTX/ hospitalization. Got flu shot. Conitinues CPAP.  Jan 25, 2010-Asthma, COPD/ Respiratory failure. OSA. Lung Nodule/XRT Had been stable through the winter with help from Xolair. 3 weeks ago returned from beach with earache, cough, fever, some green. We called amoxacilin which helped- ends tomorrow. With this her oxygen saturation has been lower and she has felt weak. Denies chest pain, palpitation, edema. On arrival today on 3l pulse regulator her sat was 72%. With rest and switch  to 3 L continuous, sat is now 93%.  She had 5 XRT treatments by Dr Kathrynn Running for lung nodule- had been unable to tolerate bx procedure so no tissue dx.   Current Medications (verified): 1)  Theophylline Cr 300 Mg Xr12h-Tab (Theophylline) ....  Take 1/2  Tablet By Mouth Two Times A Day 2)  Advair Diskus 250-50 Mcg/dose  Misc (Fluticasone-Salmeterol) .... Inhale 1 Puff Two Times A Day 3)  Duoneb 2.5-0.5 Mg/71ml  Soln (Albuterol-Ipratropium) .... Inhale 1 Vial Via Hhn Two Times A Day 4)  Singulair 10 Mg  Tabs (Montelukast Sodium) .... Take 1 Tablet By Mouth Once A Day 5)  Albuterol 90 Mcg/act  Aers (Albuterol) .... Inhale 2 Puffs Every 4 Hrs As Needed 6)  Spiriva Handihaler 18 Mcg Caps (Tiotropium Bromide Monohydrate) .Marland Kitchen.. 1 Daily 7)  Fluticasone Propionate 50 Mcg/act Susp (Fluticasone Propionate) .Marland Kitchen.. 1-2 Puffs Each Nostril Daily 8)  Prednisone 5 Mg Tabs (Prednisone) .Marland Kitchen.. 1 Daily or As Directed 9)  Allergy Vaccine 1:10 Gh 10)  Cpap .Marland Kitchen.. 8 Cm H2o Pressure 11)  Oxygen 2-3 L/m 12)  Xolair 150 Mg Solr (Omalizumab) .... 225 Mg Im Every 2 Weeks 13)  Epipen 2-Pak 0.3 Mg/0.75ml (1:1000) Devi (Epinephrine Hcl (Anaphylaxis)) .... Use As Directed 14)  Mucinex 600 Mg  Tb12 (Guaifenesin) .... As Needed 15)  Budeprion Sr 150 Mg  Tb12 (Bupropion Hcl) .... Take 1 Tablet By Mouth Two Times A Day 16)  Synthroid 75 Mcg  Tabs (Levothyroxine Sodium) .... Take 1 Tablet By Mouth Once A Day 17)  Premarin 0.625 Mg  Tabs (Estrogens Conjugated) .... Take 1 Tablet By Mouth Once A Day 18)  Actonel 35 Mg  Tabs (Risedronate Sodium) .... Take 1 Tablet Once A Week 19)  Augmentin 875-125 Mg Tabs (Amoxicillin-Pot Clavulanate) .Marland KitchenMarland KitchenMarland Kitchen  Take 1 Tablet By Mouth Two Times A Day  Allergies (verified): 1)  Biaxin  Past History:  Past Medical History: Last updated: 03/01/2009 RHINOSINUSITIS, RECURRENT (ICD-473.9) OBSTRUCTIVE SLEEP APNEA (ICD-327.23) COPD (ICD-496) ASTHMA (ICD-493.90) ALLERGIC RHINITIS (ICD-477.9) Lung nodule    Past Surgical History: Last updated: 03/01/2009 Total Abdominal Hysterectomy Tonsillectomy Cyst from scalp bladder tack Appendectomy Needle bx lung nodule- atypical cells- complicated by PTX/ chest tube.  Family History: Last updated:  March 24, 2008 Mother- deceased age 65; heart disease Father- deceased age 36 2 Siblings-living ages 20,76  Social History: Last updated: 03-24-2008 Patient states former smoker.  Quit 1988. Retired Married with 2 children  Risk Factors: Smoking Status: quit (10/29/2007)  Review of Systems      See HPI       The patient complains of dyspnea on exertion and prolonged cough.  The patient denies anorexia, fever, weight loss, weight gain, vision loss, hoarseness, chest pain, syncope, peripheral edema, headaches, hemoptysis, abdominal pain, and severe indigestion/heartburn.    Vital Signs:  Patient profile:   75 year old female Height:      65 inches Weight:      160 pounds BMI:     26.72 O2 Sat:      72 % on 3.5 L/min Pulse rate:   119 / minute BP sitting:   170 / 88  (left arm) Cuff size:   regular  Vitals Entered By: Reynaldo Minium CMA (Jan 25, 2010 4:02 PM)  O2 Flow:  3.5 L/min CC: Accute visit-SOB; wheezing, low O2 levels; dry cough. 1 day left of Amoxicillin. Comments Pt came into exam room with O2 pulse at 3.5L/M; was placed on 3L/M cont (our tank)-O2 returned to 93%.Pt was left on our tank until MD decides otherwise. Reynaldo Minium CMA  Jan 25, 2010 4:03 PM    Physical Exam  Additional Exam:  General: A/Ox3; pleasant and cooperative, NAD, chronic nasal speech. Wearing supplemental oxygen. Sat 93% on 3 L Looks comfortable Skin: No rash Nodes: None found enlarged HEENT: [place]/AT, EOM- WNL, Conjuctivae- clear, PERRLA, TM-WNL, Nose- clear, Throat- clear and wnl, chronic nasal sounding speech. NECK: Supple w/ fair ROM, JVD- none, normal carotid impulses w/o bruits Thyroid-  CHEST:End expiratory wheeze bilaterally, but quiet chest and unlabored , harsh cough HEART: RRR, no m/g/r heard, mild regular tachycardia Abdomen- modestly overweight ZOX:WRUE, nl pulses, no edema  NEURO: Grossly intact to observation      Impression & Recommendations:  Problem # 1:  COPD  (ICD-496) Acute bronchitis began with earache, suggesting viral. She had recently had radiation therapy which will have burned her lung a bit as well. We will give depo shot, extend antibiotic, and have her stay on continuous flow oxygen til she feels better.  Problem # 2:  LUNG NODULE (ICD-518.89) Treated without tissue dx, but high probability this is cancer.  Problem # 3:  OBSTRUCTIVE SLEEP APNEA (ICD-327.23) She remains compliant with CPAP.  Medications Added to Medication List This Visit: 1)  Doxycycline Hyclate 100 Mg Caps (Doxycycline hyclate) .... 2 today then one daily 2)  Hydromet 5-1.5 Mg/20ml Syrp (Hydrocodone-homatropine) .Marland Kitchen.. 1 teaspoon four times a day as needed cough  Other Orders: Est. Patient Level III (45409) Prescription Created Electronically [id number]) Admin of Therapeutic Inj  intramuscular or subcutaneous (47829) Depo- Medrol 80mg  (J1040)  Patient Instructions: 1)  Please schedule a follow-up appointment in 4 months. 2)  depo 80 3)  Script for cough syrup 4)  Script for doxycycline sent to your drug store 5)  Set your oxygen at 3 L/M continuous flow until you feel better and can tolerate the pulse flow setting again. Prescriptions: HYDROMET 5-1.5 MG/5ML SYRP (HYDROCODONE-HOMATROPINE) 1 teaspoon four times a day as needed cough  #200 ml x 0   Entered and Authorized by:   Waymon Budge MD   Signed by:   Waymon Budge MD on 01/25/2010   Method used:   Print then Give to Patient   RxID:   [id number] DOXYCYCLINE HYCLATE 100 MG CAPS (DOXYCYCLINE HYCLATE) 2 today then one daily  #8 x 0   Entered and Authorized by:   Waymon Budge MD   Signed by:   Waymon Budge MD on 01/25/2010   Method used:   Electronically to        [facility]. [phone]* (retail)       [address]       [place], [place]  [address]       Ph: [phone]       Fax: [phone]   RxID:   [id number]     Medication  Administration  Injection # 1:    Medication: Depo- Medrol 80mg     Diagnosis: COPD (ICD-496)    Route: SQ    Site: RUOQ gluteus    Exp Date: 07/2012    Lot #: [id number]    Mfr: Pharmacia    Patient tolerated injection without complications    Given by: Reynaldo Minium CMA (Jan 25, 2010 4:36 PM)  Orders Added: 1)  Est. Patient Level III [95638] 2)  Prescription Created Electronically [[id number]] 3)  Admin of Therapeutic Inj  intramuscular or subcutaneous [96372] 4)  Depo- Medrol 80mg  [J1040]

## 2010-10-18 NOTE — Assessment & Plan Note (Signed)
Summary: xolair/apc   Nurse Visit   Allergies: 1)  Biaxin  Medication Administration  Injection # 1:    Medication: Xolair (omalizumab) 150mg     Diagnosis: EXTRINSIC ASTHMA, UNSPECIFIED (ICD-493.00)    Route: IM    Site: R deltoid    Exp Date: 04/18/2013    Lot #: [id number]    Mfr: Salome Spotted    Comments: xolair 225 mg 0.9 x 1 in Right deltoid, 0.9 x1 in Left deltoid pt waited 20 mins    Given by: Clarise Cruz (AAMA)   Orders Added: 1)  Xolair (omalizumab) 150mg  [J2357] 2)  Administration xolair injection [62376]   Medication Administration  Injection # 1:    Medication: Xolair (omalizumab) 150mg     Diagnosis: EXTRINSIC ASTHMA, UNSPECIFIED (ICD-493.00)    Route: IM    Site: R deltoid    Exp Date: 04/18/2013    Lot #: [id number]    Mfr: Salome Spotted    Comments: xolair 225 mg 0.9 x 1 in Right deltoid, 0.9 x1 in Left deltoid pt waited 20 mins    Given by: Clarise Cruz (AAMA)   Orders Added: 1)  Xolair (omalizumab) 150mg  [J2357] 2)  Administration xolair injection [76160]

## 2010-10-18 NOTE — Progress Notes (Signed)
Summary: talk to nurse-ATC NA x 5  Phone Note Call from Patient Call back at Home Phone [phone]   Caller: Patient Call For: young Summary of Call: States she received a card from [facility] about her O2 and needs to discuss this with a nurse. Initial call taken by: Darletta Moll,  Jan 28, 2010 10:14 AM  Follow-up for Phone Call        ATC pt NA and unable to leave a msg, WCB. Vernie Murders  Jan 28, 2010 10:22 AM  ATC no answer, no voicemail, WCB. Carron Curie CMA  Jan 28, 2010 11:20 AM  ATC pt- now line is busy, [facility] later Vernie Murders  Jan 28, 2010 3:56 PM  ATC pt again, and this time NA and still unable to leave msg.  WCB on 01/31/10 Vernie Murders  Jan 28, 2010 5:17 PM    Additional Follow-up for Phone Call Additional follow up Details #1::        Spoke with pt. She stated that [facility] sent her a letter regarding her needing an office visit to continue her rx for O2.  Spoke with [facility].  Copy of office visit from 01-25-10  faxed to Elnita Maxwell at [facility] (Fax # [phone]).  Left message for pt that papers were faxed to [facility]. Additional Follow-up by: Abigail Miyamoto RN,  Jan 31, 2010 9:33 AM

## 2010-10-18 NOTE — Assessment & Plan Note (Signed)
Summary: xolair/ mbw   Nurse Visit   Allergies: 1)  Biaxin  Medication Administration  Injection # 1:    Medication: Xolair (omalizumab) 150mg     Diagnosis: EXTRINSIC ASTHMA, UNSPECIFIED (ICD-493.00)    Route: SQ    Site: L deltoid    Exp Date: 06/2013    Lot #: [id number]    Mfr: Genetech    Comments: 0.9 ML IN LEFT AND RIGHT ARM 225MG  PT DIDNT WAIT CHARGED [id number], [id number], [phone]    Given by: Kandice Hams IN ALLERGY LAB  Orders Added: 1)  Xolair (omalizumab) 150mg  [J2357] 2)  Xolair (omalizumab) 150mg  [J2357] 3)  Administration xolair injection [id number]   Medication Administration  Injection # 1:    Medication: Xolair (omalizumab) 150mg     Diagnosis: EXTRINSIC ASTHMA, UNSPECIFIED (ICD-493.00)    Route: SQ    Site: L deltoid    Exp Date: 06/2013    Lot #: [id number]    Mfr: Salome Spotted    Comments: 0.9 ML IN LEFT AND RIGHT ARM 225MG  PT DIDNT WAIT CHARGED [id number], [id number], [id number]    Given by: Kandice Hams IN ALLERGY LAB  Orders Added: 1)  Xolair (omalizumab) 150mg  [J2357] 2)  Xolair (omalizumab) 150mg  [J2357] 3)  Administration xolair injection [[id number]]

## 2010-10-18 NOTE — Assessment & Plan Note (Signed)
Summary: xolair/jd   Nurse Visit   Allergies: 1)  Biaxin  Medication Administration  Injection # 1:    Medication: Xolair (omalizumab) 150mg     Diagnosis: EXTRINSIC ASTHMA, UNSPECIFIED (ICD-493.00)    Route: IM    Site: R deltoid    Exp Date: 06/18/2013    Lot #: [id number]    Mfr: Salome Spotted    Comments: xolair 225 mg 60 units 0.9 ml x 1 in right deltoid, 0.9 ml x 1 in left deltoid    Given by: Dimas Millin, allergy tech  Orders Added: 1)  Administration xolair injection [id number] 2)  Xolair (omalizumab) 150mg  [J2357]

## 2010-10-18 NOTE — Assessment & Plan Note (Signed)
Summary: Geoffry Paradise ///kp   Nurse Visit   Allergies: 1)  Biaxin  Medication Administration  Injection # 1:    Medication: Xolair (omalizumab) 150mg     Diagnosis: EXTRINSIC ASTHMA, UNSPECIFIED (ICD-493.00)    Route: SQ    Site: L deltoid    Exp Date: 01/16/2013    Lot #: [id number]    Mfr: GENENTECH    Comments: 0.9 ML LEFT ARM AND RIGHT PT DIDNT WAIT    Given by: TAMMY SCOTT IN ALLERGY LAB  Orders Added: 1)  Xolair (omalizumab) 150mg  [J2357] 2)  Administration xolair injection [id number]    Medication Administration  Injection # 1:    Medication: Xolair (omalizumab) 150mg     Diagnosis: EXTRINSIC ASTHMA, UNSPECIFIED (ICD-493.00)    Route: SQ    Site: L deltoid    Exp Date: 01/16/2013    Lot #: [id number]    Mfr: GENENTECH    Comments: 0.9 ML LEFT ARM AND RIGHT PT DIDNT WAIT    Given by: TAMMY SCOTT IN ALLERGY LAB  Orders Added: 1)  Xolair (omalizumab) 150mg  [J2357] 2)  Administration xolair injection [[id number]]

## 2010-10-18 NOTE — Progress Notes (Signed)
Summary: fax office notes  Phone Note From Other Clinic   Caller: Dr. Kathrynn Running - Radiation Oncology Call For: Young Summary of Call: need copy of pft & SMW sent to Dr. Kathrynn Running from 07/31/2008 Fax# [phone] Attn: Elnita Maxwell Initial call taken by: Eugene Gavia,  September 27, 2009 1:30 PM  Follow-up for Phone Call        faxed office notes and pft 01/12//Juanita

## 2010-10-18 NOTE — Assessment & Plan Note (Signed)
Summary: xolair/apc   Nurse Visit   Allergies: 1)  Biaxin  Medication Administration  Injection # 1:    Medication: Xolair (omalizumab) 150mg     Diagnosis: EXTRINSIC ASTHMA, UNSPECIFIED (ICD-493.00)    Route: SQ    Site: R deltoid    Exp Date: 11/18/2012    Lot #: [id number]    Mfr: GENENTECH    Comments: 0.9 ML IN RIGHT AND LEFT ARM PT DIDNT WAIT    Given by: TAMMY SCOTT IN ALLERGY LAB  Orders Added: 1)  Xolair (omalizumab) 150mg  [J2357] 2)  Administration xolair injection [id number]   Medication Administration  Injection # 1:    Medication: Xolair (omalizumab) 150mg     Diagnosis: EXTRINSIC ASTHMA, UNSPECIFIED (ICD-493.00)    Route: SQ    Site: R deltoid    Exp Date: 11/18/2012    Lot #: [id number]    Mfr: GENENTECH    Comments: 0.9 ML IN RIGHT AND LEFT ARM PT DIDNT WAIT    Given by: TAMMY SCOTT IN ALLERGY LAB  Orders Added: 1)  Xolair (omalizumab) 150mg  [J2357] 2)  Administration xolair injection [[id number]]

## 2010-10-18 NOTE — Miscellaneous (Signed)
Summary: Injection Record/[facility]  Injection Record/[facility]   Imported By: Sherian Rein 01/20/2010 13:54:52  _____________________________________________________________________  External Attachment:    Type:   Image     Comment:   External Document

## 2010-10-18 NOTE — Miscellaneous (Signed)
Summary: Injection Record / [facility]    Injection Record / [facility]    Imported By: Lennie Odor 05/20/2010 11:43:24  _____________________________________________________________________  External Attachment:    Type:   Image     Comment:   External Document

## 2010-10-18 NOTE — Letter (Signed)
[facility]   Imported By: Sherian Rein 11/04/2009 14:20:30  _____________________________________________________________________  External Attachment:    Type:   Image     Comment:   External Document

## 2010-10-18 NOTE — Assessment & Plan Note (Signed)
Summary: xolair/mhh   Nurse Visit   Allergies: 1)  Biaxin  Medication Administration  Injection # 1:    Medication: Xolair (omalizumab) 150mg     Diagnosis: EXTRINSIC ASTHMA, UNSPECIFIED (ICD-493.00)    Route: SQ    Site: R deltoid    Exp Date: 09/2012    Lot #: [id number]    Mfr: Mendel Ryder    Comments: Injection given by Dimas Millin in allergy lab. Xolair 225mg . 0.36ml x 1 in Right and Left Deltoid. Pt waited 30 minutes.    Patient tolerated injection without complications  Orders Added: 1)  Admin of Therapeutic Inj  intramuscular or subcutaneous [96372] 2)  Xolair (omalizumab) 150mg  [J2357]

## 2010-10-18 NOTE — Assessment & Plan Note (Signed)
Summary: xolair///kp   Nurse Visit   Allergies: 1)  Biaxin  Medication Administration  Injection # 1:    Medication: Xolair (omalizumab) 150mg     Diagnosis: EXTRINSIC ASTHMA, UNSPECIFIED (ICD-493.00)    Route: SQ    Site: R deltoid    Exp Date: 11/18/2012    Lot #: [id number]    Mfr: GENETECH    Comments: 0.9 ML LEFT AND RIGHT ARM PT DIDNT WAIT    Given by: TAMMY SCOTT IN ALLERGY LAB  Orders Added: 1)  Xolair (omalizumab) 150mg  [J2357] 2)  Administration xolair injection [id number]   Medication Administration  Injection # 1:    Medication: Xolair (omalizumab) 150mg     Diagnosis: EXTRINSIC ASTHMA, UNSPECIFIED (ICD-493.00)    Route: SQ    Site: R deltoid    Exp Date: 11/18/2012    Lot #: [id number]    Mfr: GENETECH    Comments: 0.9 ML LEFT AND RIGHT ARM PT DIDNT WAIT    Given by: TAMMY SCOTT IN ALLERGY LAB  Orders Added: 1)  Xolair (omalizumab) 150mg  [J2357] 2)  Administration xolair injection [[id number]]

## 2010-10-18 NOTE — Progress Notes (Signed)
Summary: sore throat  Phone Note Call from Patient Call back at [phone]   Caller: Patient Call For: young Summary of Call: sore throat ear pain and tightness in chest [facility] Initial call taken by: Rickard Patience,  Jan 17, 2010 10:26 AM  Follow-up for Phone Call        pt c/o head congestion, productive cough with light yellow phlegm, wheezing, chest tightness, sore throat, pain in both ears x 4 days. Pt has been using mucinex with no relief. Please advise.Carron Curie CMA  Jan 17, 2010 10:29 AM allergies: biaxin  Additional Follow-up for Phone Call Additional follow up Details #1::        Per CDY-give Augmentin 875mg  #14 take 1 by mouth two times a day no refills.Reynaldo Minium CMA  Jan 17, 2010 11:09 AM     Additional Follow-up for Phone Call Additional follow up Details #2::    Spoke with pt.  Pt informed of above recs per CY and aware augmentin sent ot [facility].  She verbalized understanding. Gweneth Dimitri RN  Jan 17, 2010 11:22 AM   New/Updated Medications: AUGMENTIN 875-125 MG TABS (AMOXICILLIN-POT CLAVULANATE) take 1 tablet by mouth two times a day Prescriptions: AUGMENTIN 875-125 MG TABS (AMOXICILLIN-POT CLAVULANATE) take 1 tablet by mouth two times a day  #14 x 0   Entered by:   Gweneth Dimitri RN   Authorized by:   Waymon Budge MD   Signed by:   Gweneth Dimitri RN on 01/17/2010   Method used:   Electronically to        [facility]. [phone]* (retail)       [address]       [place], [place]  [address]       Ph: [phone]       Fax: [phone]   RxID:   [id number]

## 2010-10-18 NOTE — Letter (Signed)
Summary: CMN for Nebulizer & Meds/[facility]  CMN for Nebulizer & Meds/[facility]   Imported By: Sherian Rein 02/10/2010 15:07:24  _____________________________________________________________________  External Attachment:    Type:   Image     Comment:   External Document

## 2010-10-18 NOTE — Miscellaneous (Signed)
Summary: Injection Record / [facility]    Injection Record /  Allergy    Imported By: Lennie Odor 02/07/2010 14:32:39  _____________________________________________________________________  External Attachment:    Type:   Image     Comment:   External Document

## 2010-10-18 NOTE — Assessment & Plan Note (Signed)
Summary: xolair/apc   Nurse Visit   Allergies: 1)  Biaxin  Medication Administration  Injection # 1:    Medication: Xolair (omalizumab) 150mg     Diagnosis: EXTRINSIC ASTHMA, UNSPECIFIED (ICD-493.00)    Route: SQ    Site: R deltoid    Exp Date: 11/2012    Lot #: [id number]    Mfr: Mendel Ryder    Comments: Injection given by Dimas Millin in allergy lab. Xolair 225mg . 0.55ml x 1 in Right and Left Deltoid. Pt waited 20 minutes.     Patient tolerated injection without complications  Orders Added: 1)  Admin of Therapeutic Inj  intramuscular or subcutaneous [96372] 2)  Xolair (omalizumab) 150mg  [J2357]

## 2010-10-18 NOTE — Assessment & Plan Note (Signed)
Summary: xolair/ mbw   Nurse Visit   Allergies: 1)  Biaxin  Medication Administration  Injection # 1:    Medication: Xolair (omalizumab) 150mg     Diagnosis: EXTRINSIC ASTHMA, UNSPECIFIED (ICD-493.00)    Route: IM    Site: R deltoid    Exp Date: 06/18/2013    Lot #: [id number]    Mfr: Genetech    Comments: Xolair 225 mg, 60 units, 0.9 ml in right deltoid and 0.9 ml in left deltoid Patient didn't wait.      Given by: Drucie Opitz, CMA (AAMA)  Orders Added: 1)  Xolair (omalizumab) 150mg  [J2357] 2)  Administration xolair injection [phone]

## 2010-10-18 NOTE — Progress Notes (Signed)
Summary: COUGH/ SOB-lmtcb x 1  Phone Note Call from Patient Call back at Home Phone [phone]   Caller: Patient Call For: young Summary of Call: pt says she is still SOB/ coughing/ yellow phlegm (see emr msg 5/5) although sometimes it's clear. no fever. still on abx. still taking musinex. thinks she may need zpac. [facility] [address] # [phone]. CALL PT AT HOME # ABOVE OR [phone] Initial call taken by: Tivis Ringer, CNA,  Jan 24, 2010 12:24 PM  Follow-up for Phone Call        ATC pt at home number and NA, unable to leave msg.  Called other number listed and had to [facility]  Jan 24, 2010 1:29 PM   called and spoke with pt.  pt last saw [facility] Oct 2010.  Pt states she just finished Augmentin today and c/o increased coughing with clear to yellow sputum, wheezing, tightness in chest and throat, head congestion with clear nasal drainage.  Pt denied fever.  Pt also c/o increased sob and states while waking in her home she checked her o2 sats and they ranged from 78% to 81%.  I scheduled pt to see CY today at 3:45pm.  Arman Filter LPN  Jan 25, 2010 9:38 AM

## 2010-10-19 ENCOUNTER — Encounter: Payer: Self-pay | Admitting: Radiation Oncology

## 2010-10-19 DIAGNOSIS — J301 Allergic rhinitis due to pollen: Secondary | ICD-10-CM

## 2010-10-20 ENCOUNTER — Ambulatory Visit: Payer: Medicare Other | Attending: Radiation Oncology | Admitting: Radiation Oncology

## 2010-10-20 ENCOUNTER — Ambulatory Visit: Payer: Self-pay | Admitting: Radiation Oncology

## 2010-10-20 ENCOUNTER — Encounter: Payer: Self-pay | Admitting: Internal Medicine

## 2010-10-20 NOTE — Assessment & Plan Note (Signed)
Summary: XOLAIR/KLW   Nurse Visit   Allergies: 1)  Biaxin  Medication Administration  Injection # 1:    Medication: Xolair (omalizumab) 150mg     Diagnosis: EXTRINSIC ASTHMA, UNSPECIFIED (ICD-493.00)    Route: SQ    Site: L deltoid    Exp Date: 09/2013    Lot #: [id number]    Mfr: Genetech    Comments: 0.9 ML X LEFT AND RIGHT ARM CHARGE [id number], [id number], [id number]    Patient tolerated injection without complications    Given by: SUSANNE FORD IN ALLERGY LAB  Orders Added: 1)  Xolair (omalizumab) 150mg  [J2357] 2)  Xolair (omalizumab) 150mg  [J2357] 3)  Administration xolair injection [id number]   Medication Administration  Injection # 1:    Medication: Xolair (omalizumab) 150mg     Diagnosis: EXTRINSIC ASTHMA, UNSPECIFIED (ICD-493.00)    Route: SQ    Site: L deltoid    Exp Date: 09/2013    Lot #: [id number]    Mfr: Genetech    Comments: 0.9 ML X LEFT AND RIGHT ARM CHARGE [id number], [id number], [id number]    Patient tolerated injection without complications    Given by: SUSANNE FORD IN ALLERGY LAB  Orders Added: 1)  Xolair (omalizumab) 150mg  [J2357] 2)  Xolair (omalizumab) 150mg  [J2357] 3)  Administration xolair injection [[id number]]

## 2010-10-20 NOTE — Progress Notes (Signed)
Summary: flu sx  Phone Note Call from Patient   Caller: Patient Call For: [name] Summary of Call: pt c/o "feeling like she has the flu" x 3 days. temp is 99. aches all over. has a cough but this is "ok with taking musinex". also had a headache for 2 days but none today. wants to know [facility] [address] pt # is [phone] (home # above is spouse's cell) Initial call taken by: Tivis Ringer, CNA,  September 29, 2010 3:09 PM  Follow-up for Phone Call        ATC NA and no option to leave a msg Vernie Murders  September 29, 2010 3:38 PM  ATC pt and line was busy- Vernie Murders  September 29, 2010 4:16 PM  ATC NA and no option to leave msg, WCB Vernie Murders  September 30, 2010 8:56 AM   Additional Follow-up for Phone Call Additional follow up Details #1::        called and spoke with pt and she stated that she has been sick since monday---fever started on tuesday but no fever now----aches all over, headache, cough with white sputum.  pt is requesing something to take other than the mucinex and aleve.  please advise. thanks Randell Loop CMA  September 30, 2010 2:21 PM    ALLERGIES::  biaxin     Additional Follow-up for Phone Call Additional follow up Details #2::    Per CDY-okay to give Zpak #1 take as directed no refills.Reynaldo Minium CMA  September 30, 2010 3:49 PM   Additional Follow-up for Phone Call Additional follow up Details #3:: Details for Additional Follow-up Action Taken: Spoke with pt and notified of the above, rx was sent to pharm. Additional Follow-up by: Vernie Murders,  September 30, 2010 3:51 PM  New/Updated Medications: ZITHROMAX Z-PAK 250 MG TABS (AZITHROMYCIN) take as directed Prescriptions: ZITHROMAX Z-PAK 250 MG TABS (AZITHROMYCIN) take as directed  #1 x 0   Entered by:   Vernie Murders   Authorized by:   Waymon Budge MD   Signed by:   Vernie Murders on 09/30/2010   Method used:   Electronically to        [facility].  [phone]* (retail)       [address]       [place], [place]  [address]       Ph: [phone]       Fax: [phone]   RxID:   [id number]

## 2010-10-20 NOTE — Assessment & Plan Note (Signed)
Summary: XOLAIR/KLW   Nurse Visit   Allergies: 1)  Biaxin  Medication Administration  Injection # 1:    Medication: Xolair (omalizumab) 150mg     Diagnosis: EXTRINSIC ASTHMA, UNSPECIFIED (ICD-493.00)    Route: SQ    Site: R deltoid    Exp Date: 09/2013    Lot #: [id number]    Mfr: Genetech    Comments: 0.9 ML IN RIGHT AND LEFT ARM 225MG  CHARGED [id number], [id number], [id number]     Given by: Drucie Opitz IN ALLERGY LAB  Orders Added: 1)  Xolair (omalizumab) 150mg  [J2357] 2)  Xolair (omalizumab) 150mg  [J2357] 3)  Administration xolair injection [[id number]]   Medication Administration  Injection # 1:    Medication: Xolair (omalizumab) 150mg     Diagnosis: EXTRINSIC ASTHMA, UNSPECIFIED (ICD-493.00)    Route: SQ    Site: R deltoid    Exp Date: 09/2013    Lot #: [id number]    Mfr: Genetech    Comments: 0.9 ML IN RIGHT AND LEFT ARM 225MG  CHARGED [id number], [id number], [id number]     Given by: Drucie Opitz IN ALLERGY LAB  Orders Added: 1)  Xolair (omalizumab) 150mg  [J2357] 2)  Xolair (omalizumab) 150mg  [J2357] 3)  Administration xolair injection [[id number]]

## 2010-10-20 NOTE — Progress Notes (Signed)
Summary: discharged from hospital questions re; meds that were dicontinue  Phone Note Call from Patient   Caller: Patient Call For: dr Maple Hudson Summary of Call: patient phoned stated that she just got out of the hospital and that she was taken off some of her medicines and she has questions about that/ Patient can be reached [phone] Initial call taken by: Vedia Coffer,  October 14, 2010 1:46 PM  Follow-up for Phone Call        Called, spoke with pt.  States she was admitid to [facility] on Tueday d/t rapid heart rate but was not told how to continue her medications.  Advised, at the time of discharge, she should have received an instruction sheet with directions on how to take her medications.  Pt states she now sees this.  She will review this instruction sheet and call back if she has any further questions or needs any further clarrification. Follow-up by: Gweneth Dimitri RN,  October 14, 2010 3:41 PM

## 2010-10-21 ENCOUNTER — Ambulatory Visit (INDEPENDENT_AMBULATORY_CARE_PROVIDER_SITE_OTHER): Payer: Medicare Other | Admitting: Internal Medicine

## 2010-10-21 ENCOUNTER — Other Ambulatory Visit: Payer: Self-pay | Admitting: Radiation Oncology

## 2010-10-21 ENCOUNTER — Encounter: Payer: Self-pay | Admitting: Internal Medicine

## 2010-10-21 DIAGNOSIS — J45909 Unspecified asthma, uncomplicated: Secondary | ICD-10-CM

## 2010-10-21 DIAGNOSIS — J449 Chronic obstructive pulmonary disease, unspecified: Secondary | ICD-10-CM

## 2010-10-21 DIAGNOSIS — J984 Other disorders of lung: Secondary | ICD-10-CM

## 2010-10-21 DIAGNOSIS — C3490 Malignant neoplasm of unspecified part of unspecified bronchus or lung: Secondary | ICD-10-CM

## 2010-10-26 NOTE — Assessment & Plan Note (Signed)
Summary: congestion and cough/mhh   Vital Signs:  Patient profile:   75 year old female Height:      65 inches Weight:      176 pounds BMI:     29.39 O2 Sat:      96 % on 2.5 L/minpulsed Pulse rate:   88 / minute BP sitting:   148 / 70  (right arm) Cuff size:   regular  Vitals Entered By: Gweneth Dimitri RN (October 21, 2010 4:23 PM)  O2 Flow:  2.5 L/minpulsed CC: Acute Visit.  low grade fever 99, chest congestion, increased SOB, cough with white mucus and occas yellow tinge x 3-4 days. Comments Medications reviewed with patient Daytime contact number verified with patient. Gweneth Dimitri RN  October 21, 2010 4:21 PM    Primary Provider/Referring Provider:  Burney Gauze  CC:  Acute Visit.  low grade fever 99, chest congestion, increased SOB, and cough with white mucus and occas yellow tinge x 3-4 days.Marland Kitchen  History of Present Illness:  May 30, 2010-  She drove here today having forgotten her portable oxygen because she stays on concentrator at home. Saturation was 93% on our O2 at 3 L- not recorded on room air. She stays on 2-3 L at home. She has been comfortable with a little self limited cough but no major flares through the summer. She stays on 5 mg prednisone daily. When she was coughing a couple of weeks ago she took 10 mg for a few days. Denies chest pain., spalpitation, swelling, glands, fever, chills or blood. contiues Xolair and we think that is likely part of the reason she has been more stable. Using rescue inhaler if out more with more exertion. Had XRT by Dr Kathrynn Running who is following CT with repeated CT planned in 6 months.  October 21, 2010- -Asthma, COPD/ Respiratory failure. OSA. Lung Nodule/XRT Nurse-CC: Acute Visit.  low grade fever 99, chest congestion, increased SOB, cough with white mucus and occas yellow tinge x 3-4 days. Hosp f/u- 1/25-27/12 - Dr Deborah Chalk for SVT. Rhythm strraightened out. Lung mass- Saw Dr Kathrynn Running yest to f/u empiric XRT and told it looked  good. They noted she was wheezing alot and asked her to see me. Today she notes lose cough, tired, achey, low grade temp 99.2. Zpak helped similar illness a few weeks ago. Phlegm is white and GI ok so far. She has been restless and was going into den, taking CPAP off to sleep on couch. She suspects missing CPAP was a trigger for the SVT.     Preventive Screening-Counseling & Management  Alcohol-Tobacco     Smoking Status: quit     Year Quit: 1999     Pack years: 34yrs, 2ppd  Current Medications (verified): 1)  Theophylline Cr 300 Mg Xr12h-Tab (Theophylline) .... Take 1/2  Tablet By Mouth Two Times A Day 2)  Advair Diskus 250-50 Mcg/dose  Misc (Fluticasone-Salmeterol) .... Inhale 1 Puff Two Times A Day 3)  Duoneb 2.5-0.5 Mg/51ml  Soln (Albuterol-Ipratropium) .... Inhale 1 Vial Via Hhn Two Times A Day 4)  Singulair 10 Mg  Tabs (Montelukast Sodium) .... Take 1 Tablet By Mouth Once A Day 5)  Albuterol 90 Mcg/act  Aers (Albuterol) .... Inhale 2 Puffs Every 4 Hrs As Needed 6)  Spiriva Handihaler 18 Mcg Caps (Tiotropium Bromide Monohydrate) .Marland Kitchen.. 1 Daily 7)  Fluticasone Propionate 50 Mcg/act Susp (Fluticasone Propionate) .Marland Kitchen.. 1-2 Puffs Each Nostril Daily 8)  Prednisone 5 Mg Tabs (Prednisone) .Marland KitchenMarland KitchenMarland Kitchen 1  Daily or As Directed 9)  Allergy Vaccine 1:10 Gh 10)  Cpap .Marland Kitchen.. 8 Cm H2o Pressure 11)  Oxygen 2-3 L/m 12)  Xolair 150 Mg Solr (Omalizumab) .... 225 Mg Im Every 2 Weeks 13)  Epipen 2-Pak 0.3 Mg/0.56ml (1:1000) Devi (Epinephrine Hcl (Anaphylaxis)) .... Use As Directed 14)  Mucinex 600 Mg  Tb12 (Guaifenesin) .... As Needed 15)  Budeprion Sr 150 Mg  Tb12 (Bupropion Hcl) .... Take 1 Tablet By Mouth Two Times A Day 16)  Synthroid 75 Mcg  Tabs (Levothyroxine Sodium) .... Take 1 Tablet By Mouth Once A Day 17)  Premarin 0.625 Mg  Tabs (Estrogens Conjugated) .... Take 1 Tablet By Mouth Once A Day 18)  Actonel 35 Mg  Tabs (Risedronate Sodium) .... Take 1 Tablet Once A Week  Allergies (verified): 1)   Biaxin  Past History:  Past Surgical History: Last updated: 03/01/2009 Total Abdominal Hysterectomy Tonsillectomy Cyst from scalp bladder tack Appendectomy Needle bx lung nodule- atypical cells- complicated by PTX/ chest tube.  Family History: Last updated: 03-11-08 Mother- deceased age 64; heart disease Father- deceased age 58 2 Siblings-living ages 56,76  Social History: Last updated: 03-11-2008 Patient states former smoker.  Quit 1988. Retired Married with 2 children  Risk Factors: Smoking Status: quit (10/21/2010)  Past Medical History: RHINOSINUSITIS, RECURRENT (ICD-473.9) OBSTRUCTIVE SLEEP APNEA (ICD-327.23) COPD (ICD-496) ASTHMA (ICD-493.90) ALLERGIC RHINITIS (ICD-477.9) Lung nodule- XRT- Dr Kathrynn Running SVT- Dr Deborah Chalk    Review of Systems      See HPI       The patient complains of shortness of breath with activity, shortness of breath at rest, non-productive cough, and nasal congestion/difficulty breathing through nose.  The patient denies coughing up blood, chest pain, irregular heartbeats, acid heartburn, indigestion, loss of appetite, weight change, abdominal pain, difficulty swallowing, sore throat, tooth/dental problems, headaches, sneezing, itching, ear ache, hand/feet swelling, rash, change in color of mucus, and fever.    Physical Exam  Additional Exam:  General: A/Ox3; pleasant and cooperative, NAD, chronic nasal speech. Wearing supplemental oxygen. Sat 96% on 2 L  Skin: No rash Nodes: None found enlarged HEENT: [place]/AT, EOM- WNL, Conjuctivae- clear, PERRLA, TM-WNL, Nose- clear, Throat- clear and wnl, chronic nasal sounding speech. NECK: Supple w/ fair ROM, JVD- none, normal carotid impulses w/o bruits Thyroid-  CHEST: No cough, minimal end expiratory wheeze , unlabored HEART: RRR, no m/g/r heard, mild regular tachycardia Abdomen- modestly overweight ZOX:WRUE, nl pulses, no edema  NEURO: Grossly intact to observation      Impression &  Recommendations:  Problem # 1:  COPD (ICD-496) Mild exacerbation c/w an early URI/bronchitis. We discussed options and will give azithromycin.  Discussed O2.svt We discussed stimulant effect of her bronchodilators as related to recent hosp for SVT . If heart rhythm control becomes a problem I will see what we can do.  Problem # 2:  ASTHMA (ICD-493.90) She feels Xolair has made a real difference. We reviewed its role for allergic, not viral, asthma triggers.   Problem # 3:  OBSTRUCTIVE SLEEP APNEA (ICD-327.23) She is going to be more careful about CPAP compliance to protect her heart.  Current mask and pressure have been effective and well tolerated.  Medications Added to Medication List This Visit: 1)  Azithromycin 250 Mg Tabs (Azithromycin) .... 2 today then one daily  Other Orders: Est. Patient Level III (45409)  Patient Instructions: 1)  Keep appointment March 12 unless you need sooner. 2)  continue CPAP 3)  Script for antibiotic sent to your drug  store. Try to get rest, fluids and use your regular meds. Hopefully this won't amount to anything. 4)  cc Dr Eula Listen 5)       Dr Deborah Chalk 6)       Dr Kathrynn Running Prescriptions: AZITHROMYCIN 250 MG TABS (AZITHROMYCIN) 2 today then one daily  #10 x 0   Entered and Authorized by:   Waymon Budge MD   Signed by:   Waymon Budge MD on 10/21/2010   Method used:   Electronically to        [facility]. [phone]* (retail)       [address]       [place], [place]  [address]       Ph: [phone]       Fax: [phone]   RxID:   [id number]    Orders Added: 1)  Est. Patient Level III [87564]

## 2010-10-26 NOTE — Assessment & Plan Note (Signed)
Summary: xolair/mhh   Nurse Visit   Allergies: 1)  Biaxin  Medication Administration  Injection # 1:    Medication: Xolair (omalizumab) 150mg     Diagnosis: EXTRINSIC ASTHMA, UNSPECIFIED (ICD-493.00)    Route: SQ    Site: L deltoid    Exp Date: 09/2013    Lot #: [id number]    Mfr: Genetech    Comments: 0.9 ML IN LEFT AND RIGHT ARM 225MG  CHARGED [id number], [id number], [id number]    Given by: Dimas Millin IN ALLERGY LAB  Orders Added: 1)  Xolair (omalizumab) 150mg  [J2357] 2)  Xolair (omalizumab) 150mg  [J2357] 3)  Administration xolair injection [[id number]]   Medication Administration  Injection # 1:    Medication: Xolair (omalizumab) 150mg     Diagnosis: EXTRINSIC ASTHMA, UNSPECIFIED (ICD-493.00)    Route: SQ    Site: L deltoid    Exp Date: 09/2013    Lot #: [id number]    Mfr: [employer]etech    Comments: 0.9 ML IN LEFT AND RIGHT ARM 225MG  CHARGED [id number], [id number], [id number]    Given by: Dimas Millin IN ALLERGY LAB  Orders Added: 1)  Xolair (omalizumab) 150mg  [J2357] 2)  Xolair (omalizumab) 150mg  [J2357] 3)  Administration xolair injection [62130]

## 2010-11-01 ENCOUNTER — Encounter: Payer: Self-pay | Admitting: Internal Medicine

## 2010-11-04 ENCOUNTER — Encounter: Payer: Self-pay | Admitting: Internal Medicine

## 2010-11-04 ENCOUNTER — Ambulatory Visit (INDEPENDENT_AMBULATORY_CARE_PROVIDER_SITE_OTHER): Payer: Medicare Other

## 2010-11-04 DIAGNOSIS — J45909 Unspecified asthma, uncomplicated: Secondary | ICD-10-CM

## 2010-11-08 NOTE — Discharge Summary (Signed)
[name], [name]                ACCOUNT NO.:  [id number]  MEDICAL RECORD NO.:  [id number]          PATIENT TYPE:  INP  LOCATION:  2029                         FACILITY:  [facility]  PHYSICIAN:  Colleen Can. Deborah Chalk, M.D.DATE OF BIRTH:  [birth year]  DATE OF ADMISSION:  10/12/2010 DATE OF DISCHARGE:  10/14/2010                              DISCHARGE SUMMARY   PRIMARY CARDIOLOGIST:  New patient to Dr. Roger Shelter.  PRIMARY PULMONOLOGIST:  Rennis Chris. Maple Hudson, MD, FCCP, FACP  PRIMARY CARE PHYSICIAN:  Georgann Housekeeper, MD  DISCHARGE DIAGNOSES: 1. Supraventricular tachycardia. 2. Oxygen-dependent chronic obstructive pulmonary disease with     bronchospasms.  SECONDARY DIAGNOSES: 1. History of left-sided pneumothorax. 2. Obstructive sleep apnea. 3. Asthma. 4. History of tobacco abuse.  ALLERGIES:  CODEINE.  REASON FOR ADMISSION:  This is a 75 year old Caucasian female with no known coronary artery disease but history of COPD who presented to the emergency department at [facility] with complaints of chest pain and shortness of breath and was found to be in SVT and treated with adenosine, this did convert the rhythm back to normal sinus and the patient was discharged home.  She subsequently had return of chest pain and shortness of breath and returned to the emergency room and was found to be a narrow-complex tachycardia.  The patient was given several doses of adenosine without change in her rhythm.  Cardiology was consulted and her heart rate was approximately 210 beats per minute, still a narrow- complex tachycardia.  Blood pressure was 148/110, the patient was given Cardizem 20 mg IV x1 with immediate conversion to normal sinus rhythm in the rate of 90 beats per minute.  Her shortness of breath and chest pain had resolved.  The patient was admitted for further evaluation and observation.  HOSPITAL COURSE:  This patient was started on Cardizem 30 mg p.o. q.6 h. She tolerated this well  and was transitioned to Cardizem CD 240 mg daily with no recurrent arrhythmias or SVT.  The patient's cardiac enzymes were cycled to rule out for myocardial infarction, although this was felt to be an unlikely diagnosis.  Her enzymes were elevated, but this was felt to be a leak and secondary to her tachycardia.  The patient had no further complaints of chest pain or shortness of breath.  No further ischemic workup was indicated.  A TSH was drawn which was within normal limits at 1.777.  Again, the patient continued to remain in normal sinus rhythm.  Of note, it may be wise to change the patient's albuterol to Atrovent as an outpatient.  This will be left up to her pulmonologist, Dr. Maple Hudson, for final decision.  On day of discharge, Dr. Deborah Chalk evaluated the patient who noted her stable for home.  She had no recurrent arrhythmias.  She had no further complaints of chest pain.  Her shortness of breath was at baseline.  She will be continued on Cardizem as an outpatient as well as her home medications for COPD.  Further medication changes will be left to Dr. Maple Hudson.  Discharge instructions and plans were discussed with the patient.  She voiced understanding.  DISCHARGE LABORATORY DATA:  Cardiac enzymes:  Troponin 0.12, 0.13, 0.08; CK-MB 11.2, 13.2, 12; CK 227, 242, 227. Vital signs stable.  DISCHARGE PLAN AND INSTRUCTIONS: 1. The patient is to increase activity as tolerated. 2. The patient is to follow up with Dr. Deborah Chalk on November 11, 2010,     at 10:30 for further arrhythmias. 3. The patient is to call Dr. Maple Hudson and Dr. Venita Sheffield office for a     followup appointment in 1-2 weeks. 4. The patient is to call our office for any shortness of breath or     tachycardia. 5. The patient is to continue a low-sodium, heart-healthy diet.  DISCHARGE MEDICATIONS: 1. Diltiazem CD 240 mg 1 tablet daily. 2. Actonel 35 mg 1 tablet weekly. 3. Advair Diskus 250/50 one puff twice daily. 4. Albuterol  inhaler 90 mg 2 puffs every 4 hours as needed for     shortness of breath. 5. DuoNebs one vial inhaled twice daily as needed for shortness of     breath. 6. EpiPen 0.3 mg/0.3 mL injection one subcutaneously as needed for     allergic reaction. 7. Fluticasone nasal 50 mcg 1-2 sprays nasally daily. 8. Mucinex 600 mg for chest congestion 1-2 tablets by mouth daily as     needed. 9. Prednisone 5 mg one tablet daily. 10.Premarin 0.45 mg 1 tablet daily. 11.Singulair 10 mg 1 tablet daily. 12.Spiriva 18 mcg 1 capsule inhaled daily. 13.Synthroid 75 mcg 1 tablet daily. 14.Theophylline XR 300 mg half tablet twice daily. 15.Wellbutrin SR 150 mg 1 tablet twice daily. 16.Xolair 150 mg intramuscularly every 2 weeks.  DURATION OF DISCHARGE:  Greater than 30 minutes with physician and physician extender time.     Joan Monarch, PA-C   ______________________________ Colleen Can Deborah Chalk, M.D.    NB/MEDQ  D:  10/14/2010  T:  10/15/2010  Job:  [id number]  cc:   [name] D. Maple Hudson, MD, FCCP, FACP Georgann Housekeeper, MD  Electronically Signed by Alen Blew P.A. on 10/17/2010 01:43:36 PM Electronically Signed by Roger Shelter M.D. on 11/08/2010 09:11:51 AM

## 2010-11-09 NOTE — Letter (Signed)
Summary: [facility]  [facility]   Imported By: Sherian Rein 11/01/2010 08:53:15  _____________________________________________________________________  External Attachment:    Type:   Image     Comment:   External Document

## 2010-11-10 ENCOUNTER — Telehealth (INDEPENDENT_AMBULATORY_CARE_PROVIDER_SITE_OTHER): Payer: Self-pay | Admitting: *Deleted

## 2010-11-11 ENCOUNTER — Ambulatory Visit (INDEPENDENT_AMBULATORY_CARE_PROVIDER_SITE_OTHER): Payer: Medicare Other | Admitting: Internal Medicine

## 2010-11-11 ENCOUNTER — Ambulatory Visit: Payer: Self-pay | Admitting: Cardiology

## 2010-11-11 ENCOUNTER — Encounter: Payer: Self-pay | Admitting: Internal Medicine

## 2010-11-11 DIAGNOSIS — J45909 Unspecified asthma, uncomplicated: Secondary | ICD-10-CM

## 2010-11-11 DIAGNOSIS — J984 Other disorders of lung: Secondary | ICD-10-CM

## 2010-11-11 DIAGNOSIS — J449 Chronic obstructive pulmonary disease, unspecified: Secondary | ICD-10-CM

## 2010-11-15 NOTE — Assessment & Plan Note (Signed)
Summary: xolair//jd   Nurse Visit   Allergies: 1)  Biaxin  Medication Administration  Injection # 1:    Medication: Xolair (omalizumab) 150mg     Diagnosis: EXTRINSIC ASTHMA, UNSPECIFIED (ICD-493.00)    Route: SQ    Site: R deltoid    Exp Date: 09/2013    Lot #: [id number]    Mfr: Salome Spotted    Comments: 1.2 ML 225 MG CHARGED [id number], [id number], [id number]    Given by: Dimas Millin IN ALLERGY LAB  Orders Added: 1)  Xolair (omalizumab) 150mg  [J2357] 2)  Administration xolair injection [96401] 3)  Xolair (omalizumab) 150mg  [J2357]   Medication Administration  Injection # 1:    Medication: Xolair (omalizumab) 150mg     Diagnosis: EXTRINSIC ASTHMA, UNSPECIFIED (ICD-493.00)    Route: SQ    Site: R deltoid    Exp Date: 09/2013    Lot #: [id number]    Mfr: Salome Spotted    Comments: 1.2 ML 225 MG CHARGED [id number], [id number], [id number]    Given by: Dimas Millin IN ALLERGY LAB  Orders Added: 1)  Xolair (omalizumab) 150mg  [J2357] 2)  Administration xolair injection [96401] 3)  Xolair (omalizumab) 150mg  [J2357]

## 2010-11-15 NOTE — Progress Notes (Signed)
Summary: still sick-cough/congestion/increased sob---ov tomorrow with Cy  Phone Note Call from Patient Call back at Home Phone [phone]   Caller: Patient Call For: YOUNG Summary of Call: Patient phoned stated that she finished the zpack last week she is still having shortness of breath, congestion, and cough but it is a nonproductive cough. She is still on 5 mg predinisone but she took 10 mg for the last two days. she wanted to know if he thought that she should be seen or if he would call her something in he did not have any openings today so I sched her for tomorrow so if he will call ber something in we need to cancel that appt. Patient uses [facility] on [address] she can be reached [phone] Initial call taken by: Vedia Coffer,  November 10, 2010 11:06 AM  Follow-up for Phone Call        [facility] Vernie Murders  November 10, 2010 11:41 AM  Returning call.[name]  November 10, 2010 4:28 PM  called and spoke with pt.  pt states she finished azithromycin that was given to her by CY at last ov on 10/21/2010.  Pt states she still has chest congestion and is taking Mucinex Dm for this but is unable to get anything up.  Pt also c/o increased sob.  Denies f/c/s.  Pt does have an appt with CY tomorrow at 3:30.  Recommended pt keep this appt tomorrow with Cy.  Pt was ok with this and will come in tomorrow for OV.  Instructed pt to go to ER/Urgent care if symptoms worsen over night.  pt verbalized her understanding.  Aundra Millet Reynolds LPN  November 10, 2010 5:14 PM

## 2010-11-24 NOTE — Assessment & Plan Note (Signed)
Summary: OV CONGESTION//SH   Primary Provider/Referring Provider:  Burney Gauze  CC:  Follow up visit-congestion and Increased SOB.Marland Kitchen  History of Present Illness: October 21, 2010- -Asthma, COPD/ Respiratory failure. OSA. Lung Nodule/XRT Nurse-CC: Acute Visit.  low grade fever 99, chest congestion, increased SOB, cough with white mucus and occas yellow tinge x 3-4 days. Hosp f/u- 1/25-27/12 - Dr Deborah Chalk for SVT. Rhythm strraightened out. Lung mass- Saw Dr Kathrynn Running yest to f/u empiric XRT and told it looked good. They noted she was wheezing alot and asked her to see me. Today she notes lose cough, tired, achey, low grade temp 99.2. Zpak helped similar illness a few weeks ago. Phlegm is white and GI ok so far. She has been restless and was going into den, taking CPAP off to sleep on couch. She suspects missing CPAP was a trigger for the SVT.   November 11, 2010 Asthma, COPD/ Respiratory failure. OSA. Lung Nodule/XRT Nurse-CC: Follow up visit-congestion, Increased SOB. Acute- 1 week progressive chest congestion, shortness of breath. She checked with her heart doctor and was  directed here. Denies sore throat or productive cough. Finished  second Z pak and taking mucinex. She increaed daily prednisone from 5 to 10 mg daily now for third day. C/O malaise and weakness. Denies fever, sore throat, swollen glands, headache. GI ok.        Preventive Screening-Counseling & Management  Alcohol-Tobacco     Smoking Status: quit     Packs/Day: 2.0     Year Quit: 1999     Pack years: 1yrs, 2ppd  Current Medications (verified): 1)  Theophylline Cr 300 Mg Xr12h-Tab (Theophylline) .... Take 1/2  Tablet By Mouth Two Times A Day 2)  Advair Diskus 250-50 Mcg/dose  Misc (Fluticasone-Salmeterol) .... Inhale 1 Puff Two Times A Day 3)  Duoneb 2.5-0.5 Mg/47ml  Soln (Albuterol-Ipratropium) .... Inhale 1 Vial Via Hhn Two Times A Day 4)  Singulair 10 Mg  Tabs (Montelukast Sodium) .... Take 1 Tablet By Mouth Once A  Day 5)  Proair Hfa 108 (90 Base) Mcg/act Aers (Albuterol Sulfate) .... 2 Puffs Four Times A Day As Needed 6)  Spiriva Handihaler 18 Mcg Caps (Tiotropium Bromide Monohydrate) .Marland Kitchen.. 1 Daily 7)  Fluticasone Propionate 50 Mcg/act Susp (Fluticasone Propionate) .Marland Kitchen.. 1-2 Puffs Each Nostril Daily 8)  Prednisone 5 Mg Tabs (Prednisone) .Marland Kitchen.. 1 Daily or As Directed 9)  Allergy Vaccine 1:10 Gh 10)  Cpap .Marland Kitchen.. 8 Cm H2o Pressure 11)  Oxygen 2-3 L/m 12)  Xolair 150 Mg Solr (Omalizumab) .... 225 Mg Im Every 2 Weeks 13)  Epipen 2-Pak 0.3 Mg/0.13ml (1:1000) Devi (Epinephrine Hcl (Anaphylaxis)) .... Use As Directed 14)  Mucinex 600 Mg  Tb12 (Guaifenesin) .... As Needed 15)  Budeprion Sr 150 Mg  Tb12 (Bupropion Hcl) .... Take 1 Tablet By Mouth Two Times A Day 16)  Synthroid 75 Mcg  Tabs (Levothyroxine Sodium) .... Take 1 Tablet By Mouth Once A Day 17)  Premarin 0.625 Mg  Tabs (Estrogens Conjugated) .... Take 1 Tablet By Mouth Once A Day 18)  Actonel 35 Mg  Tabs (Risedronate Sodium) .... Take 1 Tablet Once A Week 19)  Azithromycin 250 Mg Tabs (Azithromycin) .... 2 Today Then One Daily  Allergies (verified): 1)  Biaxin  Past History:  Past Surgical History: Last updated: 03/01/2009 Total Abdominal Hysterectomy Tonsillectomy Cyst from scalp bladder tack Appendectomy Needle bx lung nodule- atypical cells- complicated by PTX/ chest tube.  Family History: Last updated: Mar 24, 2008 Mother- deceased age 85; heart  disease Father- deceased age 53 2 Siblings-living ages 74,76  Social History: Last updated: 02/25/2008 Patient states former smoker.  Quit 1988. Retired Married with 2 children  Risk Factors: Smoking Status: quit (11/11/2010) Packs/Day: 2.0 (11/11/2010)  Past Medical History: RHINOSINUSITIS, RECURRENT (ICD-473.9) OBSTRUCTIVE SLEEP APNEA (ICD-327.23) COPD (ICD-496) ASTHMA (ICD-493.90) ALLERGIC RHINITIS (ICD-477.9) Lung nodule- XRT- No biopsy- too risky- Dr Kathrynn Running SVT- Dr Deborah Chalk     Social History: Packs/Day:  2.0  Review of Systems      See HPI       The patient complains of shortness of breath with activity and non-productive cough.  The patient denies shortness of breath at rest, productive cough, coughing up blood, chest pain, irregular heartbeats, acid heartburn, indigestion, loss of appetite, weight change, abdominal pain, difficulty swallowing, sore throat, tooth/dental problems, headaches, nasal congestion/difficulty breathing through nose, and sneezing.    Vital Signs:  Patient profile:   75 year old female Height:      65 inches Weight:      177.25 pounds BMI:     29.60 O2 Sat:      96 % on 3.5 L/min Pulse rate:   93 / minute BP sitting:   144 / 62  (left arm) Cuff size:   regular  Vitals Entered By: Reynaldo Minium CMA (November 11, 2010 3:45 PM)  O2 Flow:  3.5 L/min CC: Follow up visit-congestion, Increased SOB.   Physical Exam  Additional Exam:  General: A/Ox3; pleasant and cooperative, NAD, chronic nasal speech. Wearing supplemental oxygen. Sat 96% on 3.5 L  Skin: No rash Nodes: None found enlarged HEENT: [place]/AT, EOM- WNL, Conjuctivae- clear, PERRLA, TM-WNL, Nose- clear, Throat- clear and wnl, chronic nasal sounding speech. NECK: Supple w/ fair ROM, JVD- none, normal carotid impulses w/o bruits Thyroid-  CHEST: Bilateral mild wheeze, poor airflow HEART: RRR, no m/g/r heard, mild regular tachycardia Abdomen- modestly overweight GEX:BMWU, nl pulses, no edema  NEURO: Grossly intact to observation      Impression & Recommendations:  Problem # 1:  EXTRINSIC ASTHMA, UNSPECIFIED (ICD-493.00) Chronic obstructive asthma with exacerbation. We will boost her prednisone, give augmentin. Her nebulizer helps and she increased it for this illness.   Problem # 2:  LUNG NODULE (ICD-518.89)  She has completed XRT with Dr Kathrynn Running  for presumed malignant nodule and will be watched long term.   Orders: Est. Patient Level III (13244)  Problem # 3:   COPD (ICD-496) Severe underlying COPD after long smoking history.She remains oxygen dependent.  Medications Added to Medication List This Visit: 1)  Proair Hfa 108 (90 Base) Mcg/act Aers (Albuterol sulfate) .... 2 puffs four times a day as needed 2)  Amoxicillin-pot Clavulanate 875-125 Mg Tabs (Amoxicillin-pot clavulanate) .Marland Kitchen.. 1 two times a day 3)  Hydromet 5-1.5 Mg/20ml Syrp (Hydrocodone-homatropine) .Marland Kitchen.. 1 teaspoon four times a day as needed cough  Patient Instructions: 1)  Keep scheduled appoiintment 2)  Script for antibiotic sent to drug store 3)  Increase your prednisone to 23- 30 mg/ day for next few days till you feel better, then come back down to 5 mg daily when you feel able.  4)  Refill cough syrup.  Prescriptions: HYDROMET 5-1.5 MG/5ML SYRP (HYDROCODONE-HOMATROPINE) 1 teaspoon four times a day as needed cough  #200 ml x 0   Entered and Authorized by:   Waymon Budge MD   Signed by:   Waymon Budge MD on 11/11/2010   Method used:   Print then Give to Patient   RxID:  [id number] AMOXICILLIN-POT CLAVULANATE 875-125 MG TABS (AMOXICILLIN-POT CLAVULANATE) 1 two times a day  #14 x 0   Entered and Authorized by:   Waymon Budge MD   Signed by:   Waymon Budge MD on 11/11/2010   Method used:   Electronically to        [facility]. [phone]* (retail)       [address]       [place], [place]  [address]       Ph: [phone]       Fax: [phone]   RxID:   [id number]

## 2010-11-25 ENCOUNTER — Encounter: Payer: Self-pay | Admitting: Internal Medicine

## 2010-11-25 ENCOUNTER — Ambulatory Visit (INDEPENDENT_AMBULATORY_CARE_PROVIDER_SITE_OTHER): Payer: Medicare Other

## 2010-11-25 DIAGNOSIS — J45909 Unspecified asthma, uncomplicated: Secondary | ICD-10-CM

## 2010-11-28 ENCOUNTER — Encounter: Payer: Self-pay | Admitting: Internal Medicine

## 2010-11-28 ENCOUNTER — Ambulatory Visit (INDEPENDENT_AMBULATORY_CARE_PROVIDER_SITE_OTHER): Payer: Medicare Other | Admitting: Internal Medicine

## 2010-11-28 DIAGNOSIS — R042 Hemoptysis: Secondary | ICD-10-CM

## 2010-11-28 DIAGNOSIS — I4892 Unspecified atrial flutter: Secondary | ICD-10-CM | POA: Insufficient documentation

## 2010-11-28 DIAGNOSIS — J449 Chronic obstructive pulmonary disease, unspecified: Secondary | ICD-10-CM

## 2010-11-28 DIAGNOSIS — J984 Other disorders of lung: Secondary | ICD-10-CM

## 2010-11-29 NOTE — Assessment & Plan Note (Signed)
Summary: XOLIAR./CB   Nurse Visit   Allergies: 1)  Biaxin  Medication Administration  Injection # 1:    Medication: Xolair (omalizumab) 150mg     Diagnosis: EXTRINSIC ASTHMA, UNSPECIFIED (ICD-493.00)    Route: SQ    Site: L deltoid    Exp Date: 11/2013    Lot #: [id number]    Mfr: [employer]    Comments: 0.9 ML IN LEFT AND RIGHT ARM 225MG  CHARGED J2357 X 45, [id number], [id number] X 2    Given by: Dimas Millin IN ALLERGY LAB  Orders Added: 1)  Xolair (omalizumab) 150mg  [J2357] 2)  Xolair (omalizumab) 150mg  [J2357] 3)  Administration xolair injection [id number]   Medication Administration  Injection # 1:    Medication: Xolair (omalizumab) 150mg     Diagnosis: EXTRINSIC ASTHMA, UNSPECIFIED (ICD-493.00)    Route: SQ    Site: L deltoid    Exp Date: 11/2013    Lot #: [id number]    Mfr: [employer]    Comments: 0.9 ML IN LEFT AND RIGHT ARM 225MG  CHARGED J2357 X 45, [id number], [id number] X 2    Given by: TAMMY SCOTT IN ALLERGY LAB  Orders Added: 1)  Xolair (omalizumab) 150mg  [J2357] 2)  Xolair (omalizumab) 150mg  [J2357] 3)  Administration xolair injection [[id number]]

## 2010-12-06 NOTE — Assessment & Plan Note (Signed)
Summary: 6 month rov   Primary Provider/Referring Provider:  Burney Gauze  CC:  6 month follow up visit-allergies; breathing better but occasional chest/nasal congestion.[name]  History of Present Illness: November 11, 2010  Nurse-CC: Follow up visit-congestion, Increased SOB. Acute- 1 week progressive chest congestion, shortness of breath. She checked with her heart doctor and was  directed here. Denies sore throat or productive cough. Finished  second Z pak and taking mucinex. She increaed daily prednisone from 5 to 10 mg daily now for third day. C/O malaise and weakness. Denies fever, sore throat, swollen glands, headache. GI ok.   November 28, 2010- Asthma, COPD/ Respiratory failure. OSA. Lung Nodule/XRT Nurse-CC: 6 month follow up visit-allergies; breathing better but occasional chest/nasal congestion. CPAP 8- continued full compliance and control. It helps her sleep.O2 bled in at 2l/m O2, 2-3 L/M - used continuously . Allergy vaccine- well tolerated and she has felt it helped. Needs Epipen refill- discussed.                  Little sputum. Some days nose and chest feel congested- probably a weather issue. Denies purulence, blood, chest pain or fever now.,  Preventive Screening-Counseling & Management  Alcohol-Tobacco     Smoking Status: quit     Packs/Day: 2.0     Year Quit: 1999     Pack years: 76yrs, 2ppd  Current Medications (verified): 1)  Theophylline Cr 300 Mg Xr12h-Tab (Theophylline) .... Take 1/2  Tablet By Mouth Two Times A Day 2)  Advair Diskus 250-50 Mcg/dose  Misc (Fluticasone-Salmeterol) .... Inhale 1 Puff Two Times A Day 3)  Duoneb 2.5-0.5 Mg/81ml  Soln (Albuterol-Ipratropium) .... Inhale 1 Vial Via Hhn Two Times A Day 4)  Singulair 10 Mg  Tabs (Montelukast Sodium) .... Take 1 Tablet By Mouth Once A Day 5)  Proair Hfa 108 (90 Base) Mcg/act Aers (Albuterol Sulfate) .... 2 Puffs Four Times A Day As Needed 6)  Spiriva Handihaler 18 Mcg Caps (Tiotropium Bromide Monohydrate) .[name]..  1 Daily 7)  Fluticasone Propionate 50 Mcg/act Susp (Fluticasone Propionate) .[name].. 1-2 Puffs Each Nostril Daily 8)  Prednisone 5 Mg Tabs (Prednisone) .[name].. 1 Daily or As Directed 9)  Allergy Vaccine 1:10 Gh 10)  Cpap .[name].. 8 Cm H2o Pressure Ahc 11)  Oxygen 2-3 L/m 12)  Xolair 150 Mg Solr (Omalizumab) .... 225 Mg Im Every 2 Weeks 13)  Epipen 2-Pak 0.3 Mg/0.38ml (1:1000) Devi (Epinephrine Hcl (Anaphylaxis)) .... Use As Directed 14)  Mucinex 600 Mg  Tb12 (Guaifenesin) .... As Needed 15)  Budeprion Sr 150 Mg  Tb12 (Bupropion Hcl) .... Take 1 Tablet By Mouth Two Times A Day 16)  Synthroid 75 Mcg  Tabs (Levothyroxine Sodium) .... Take 1 Tablet By Mouth Once A Day 17)  Premarin 0.625 Mg  Tabs (Estrogens Conjugated) .... Take 1 Tablet By Mouth Once A Day 18)  Actonel 35 Mg  Tabs (Risedronate Sodium) .... Take 1 Tablet Once A Week  Allergies (verified): 1)  Biaxin  Past History:  Past Medical History: Last updated: 11/11/2010 RHINOSINUSITIS, RECURRENT (ICD-473.9) OBSTRUCTIVE SLEEP APNEA (ICD-327.23) COPD (ICD-496) ASTHMA (ICD-493.90) ALLERGIC RHINITIS (ICD-477.9) Lung nodule- XRT- No biopsy- too risky- Dr Kathrynn Running SVT- Dr Deborah Chalk    Past Surgical History: Last updated: 03/01/2009 Total Abdominal Hysterectomy Tonsillectomy Cyst from scalp bladder tack Appendectomy Needle bx lung nodule- atypical cells- complicated by PTX/ chest tube.  Family History: Last updated: 03-16-2008 Mother- deceased age 15; heart disease Father- deceased age 74 2 Siblings-living ages 77,76  Social History:  Last updated: 02/25/2008 Patient states former smoker.  Quit 1988. Retired Married with 2 children  Risk Factors: Smoking Status: quit (11/28/2010) Packs/Day: 2.0 (11/28/2010)  Review of Systems      See HPI       The patient complains of shortness of breath with activity and shortness of breath at rest.  The patient denies productive cough, non-productive cough, coughing up blood, chest pain,  irregular heartbeats, acid heartburn, indigestion, loss of appetite, weight change, abdominal pain, difficulty swallowing, sore throat, tooth/dental problems, headaches, nasal congestion/difficulty breathing through nose, sneezing, itching, ear ache, anxiety, hand/feet swelling, joint stiffness or pain, rash, change in color of mucus, and fever.    Vital Signs:  Patient profile:   75 year old female Height:      65 inches Weight:      174.50 pounds BMI:     29.14 O2 Sat:      92 % on 3 L/min Pulse rate:   99 / minute BP sitting:   134 / 62  (left arm) Cuff size:   regular  Vitals Entered By: Reynaldo Minium CMA (November 28, 2010 2:08 PM)  O2 Flow:  3 L/min CC: 6 month follow up visit-allergies; breathing better but occasional chest/nasal congestion. Comments O2 sat on room air rest: 88% O2 sat on room air ambulating: 85% O2 sat on Oxygen ambulating: 92% 3L/M.Reynaldo Minium CMA  November 28, 2010 2:17 PM    Physical Exam  Additional Exam:  General: A/Ox3; pleasant and cooperative, NAD, chronic nasal speech. Wearing supplemental oxygen. Sat 92% on 3. L  Skin: No rash Nodes: None found enlarged HEENT: [place]/AT, EOM- WNL, Conjuctivae- clear, PERRLA, TM-WNL, Nose- clear, Throat- clear and wnl, chronic nasal sounding speech. NECK: Supple w/ fair ROM, JVD- none, normal carotid impulses w/o bruits Thyroid-  CHEST: Bilateral mild wheeze, poor airflow HEART: RRR, no m/g/r heard, mild regular tachycardia Abdomen- modestly overweight JWJ:XBJY, nl pulses, no edema  NEURO: Grossly intact to observation      Impression & Recommendations:  Problem # 1:  COPD (ICD-496) She tries to keep O2 between 2.5 and 3 l/m , up to 3.5 when walking. no real change since she got over the winter bonchitis.  Problem # 2:  LUNG NODULE (ICD-518.89)  Now s/p XRT, is followed there every 6 months. So far so good.   Problem # 3:  HEMOPTYSIS (ICD-786.3)  No recurrence. This was probably a hemorrhagic bronchitis. I  doubt the irradiated lesion was bleeding. The following medications were removed from the medication list:    Azithromycin 250 Mg Tabs (Azithromycin) .[name]... 2 today then one daily    Amoxicillin-pot Clavulanate 875-125 Mg Tabs (Amoxicillin-pot clavulanate) .[name]... 1 two times a day Her updated medication list for this problem includes:    Theophylline Cr 300 Mg Xr12h-tab (Theophylline) .[name]... Take 1/2  tablet by mouth two times a day    Advair Diskus 250-50 Mcg/dose Misc (Fluticasone-salmeterol) ..... Inhale 1 puff two times a day    Duoneb 2.5-0.5 Mg/91ml Soln (Albuterol-ipratropium) ..... Inhale 1 vial via hhn two times a day    Singulair 10 Mg Tabs (Montelukast sodium) .[name]... Take 1 tablet by mouth once a day    Proair Hfa 108 (90 Base) Mcg/act Aers (Albuterol sulfate) .[name]... 2 puffs four times a day as needed    Spiriva Handihaler 18 Mcg Caps (Tiotropium bromide monohydrate) .[name]... 1 daily    Prednisone 5 Mg Tabs (Prednisone) .[name]... 1 daily or as directed    Xolair 150  Mg Solr (Omalizumab) .[name]... 225 mg im every 2 weeks  Problem # 4:  OBSTRUCTIVE SLEEP APNEA (ICD-327.23) Good compliance and control with CPAP  Problem # 5:  CARDIAC ARRHYTHMIAS, HX OF (ICD-V12.50)  She has hx palpitations followed at [facility]. She is interested in switching into [facility] cardiology. Previoulsy followed by Dr Deborah Chalk.   Orders: Cardiology Referral (Cardiology)  Medications Added to Medication List This Visit: 1)  Cpap  .[name].. 8 cm h2o pressure ahc  Other Orders: Est. Patient Level III (78469)  Patient Instructions: 1)  Please schedule a follow-up appointment in 6 months. Please call sooner as needed. 2)  Script sent for Epipen to hold in case of severe allergic reaction while you are on allergy shots.  3)  PCC can call you about cardiology Prescriptions: EPIPEN 2-PAK 0.3 MG/0.3ML (1:1000) DEVI (EPINEPHRINE HCL (ANAPHYLAXIS)) Use as directed  #1 x 11   Entered and Authorized by:   Waymon Budge MD   Signed by:    Waymon Budge MD on 11/28/2010   Method used:   Electronically to        [facility]. [phone]* (retail)       [address]       [place], [place]  [address]       Ph: [phone]       Fax: [phone]   RxID:   [id number]

## 2010-12-13 ENCOUNTER — Ambulatory Visit (INDEPENDENT_AMBULATORY_CARE_PROVIDER_SITE_OTHER): Payer: Medicare Other

## 2010-12-13 DIAGNOSIS — J45909 Unspecified asthma, uncomplicated: Secondary | ICD-10-CM

## 2010-12-14 ENCOUNTER — Encounter: Payer: Self-pay | Admitting: Cardiovascular Disease

## 2010-12-15 ENCOUNTER — Other Ambulatory Visit: Payer: Self-pay | Admitting: Internal Medicine

## 2010-12-19 LAB — POCT I-STAT, CHEM 8
BUN: 15 mg/dL (ref 6–23)
Creatinine, Ser: 0.9 mg/dL (ref 0.4–1.2)
Potassium: 3.8 mEq/L (ref 3.5–5.1)
Sodium: 139 mEq/L (ref 135–145)
TCO2: 32 mmol/L (ref 0–100)

## 2010-12-19 LAB — TROPONIN I: Troponin I: 0.08 ng/mL — ABNORMAL HIGH (ref 0.00–0.06)

## 2010-12-19 LAB — CK TOTAL AND CKMB (NOT AT ARMC)
CK, MB: 10.9 ng/mL — ABNORMAL HIGH (ref 0.3–4.0)
Total CK: 133 U/L (ref 7–177)

## 2010-12-19 LAB — DIFFERENTIAL
Eosinophils Relative: 1 % (ref 0–5)
Lymphocytes Relative: 8 % — ABNORMAL LOW (ref 12–46)
Lymphs Abs: 1.4 10*3/uL (ref 0.7–4.0)
Monocytes Relative: 7 % (ref 3–12)

## 2010-12-19 LAB — CBC
HCT: 43.2 % (ref 36.0–46.0)
Platelets: 254 10*3/uL (ref 150–400)
RBC: 4.78 MIL/uL (ref 3.87–5.11)
WBC: 17.4 10*3/uL — ABNORMAL HIGH (ref 4.0–10.5)

## 2010-12-19 LAB — POCT CARDIAC MARKERS: Troponin i, poc: <0.05 ng/mL (ref 0.00–0.09)

## 2010-12-19 MED ORDER — OMALIZUMAB 150 MG ~~LOC~~ SOLR
225.0000 mg | Freq: Once | SUBCUTANEOUS | Status: AC
  Administered 2010-12-19: 225 mg via SUBCUTANEOUS

## 2010-12-19 MED ORDER — OMALIZUMAB 150 MG ~~LOC~~ SOLR: 225.0000 mg | Freq: Once | SUBCUTANEOUS | Status: DC

## 2010-12-27 ENCOUNTER — Ambulatory Visit: Payer: Medicare Other | Admitting: Cardiovascular Disease

## 2010-12-28 LAB — CBC
HCT: 42.8 % (ref 36.0–46.0)
MCV: 91.2 fL (ref 78.0–100.0)
RBC: 4.69 MIL/uL (ref 3.87–5.11)
WBC: 13 10*3/uL — ABNORMAL HIGH (ref 4.0–10.5)

## 2010-12-28 LAB — BASIC METABOLIC PANEL
BUN: 16 mg/dL (ref 6–23)
Chloride: 105 mEq/L (ref 96–112)
GFR calc Af Amer: >60 mL/min (ref >60)
Potassium: 3.7 mEq/L (ref 3.5–5.1)

## 2010-12-28 LAB — GLUCOSE, CAPILLARY: Glucose-Capillary: 146 mg/dL — ABNORMAL HIGH (ref 70–99)

## 2010-12-29 ENCOUNTER — Ambulatory Visit (INDEPENDENT_AMBULATORY_CARE_PROVIDER_SITE_OTHER): Payer: Medicare Other

## 2010-12-29 DIAGNOSIS — J309 Allergic rhinitis, unspecified: Secondary | ICD-10-CM

## 2010-12-29 DIAGNOSIS — J45909 Unspecified asthma, uncomplicated: Secondary | ICD-10-CM

## 2010-12-29 LAB — GLUCOSE, CAPILLARY: Glucose-Capillary: 111 mg/dL — ABNORMAL HIGH (ref 70–99)

## 2010-12-30 MED ORDER — OMALIZUMAB 150 MG ~~LOC~~ SOLR
225.0000 mg | Freq: Once | SUBCUTANEOUS | Status: AC
  Administered 2010-12-29: 225 mg via SUBCUTANEOUS

## 2011-01-03 LAB — POCT CARDIAC MARKERS
CKMB, poc: 5.8 ng/mL (ref 1.0–8.0)
CKMB, poc: 6.2 ng/mL (ref 1.0–8.0)
Troponin i, poc: <0.05 ng/mL (ref 0.00–0.09)

## 2011-01-03 LAB — CULTURE, BLOOD (ROUTINE X 2): Culture: NO GROWTH

## 2011-01-03 LAB — CBC
Hemoglobin: 15.7 g/dL — ABNORMAL HIGH (ref 12.0–15.0)
RBC: 5.06 MIL/uL (ref 3.87–5.11)
WBC: 17.4 10*3/uL — ABNORMAL HIGH (ref 4.0–10.5)

## 2011-01-03 LAB — DIFFERENTIAL
Lymphocytes Relative: 7 % — ABNORMAL LOW (ref 12–46)
Monocytes Absolute: 1.2 10*3/uL — ABNORMAL HIGH (ref 0.1–1.0)
Monocytes Relative: 7 % (ref 3–12)
Neutro Abs: 14.9 10*3/uL — ABNORMAL HIGH (ref 1.7–7.7)

## 2011-01-03 LAB — POCT I-STAT, CHEM 8
Glucose, Bld: 106 mg/dL — ABNORMAL HIGH (ref 70–99)
HCT: 49 % — ABNORMAL HIGH (ref 36.0–46.0)
Hemoglobin: 16.7 g/dL — ABNORMAL HIGH (ref 12.0–15.0)
Potassium: 4.1 mEq/L (ref 3.5–5.1)
TCO2: 28 mmol/L (ref 0–100)

## 2011-01-03 LAB — EXPECTORATED SPUTUM ASSESSMENT W GRAM STAIN, RFLX TO RESP C

## 2011-01-03 LAB — URINE CULTURE

## 2011-01-03 LAB — THEOPHYLLINE LEVEL: Theophylline Lvl: 4 ug/mL — ABNORMAL LOW (ref 10.0–20.0)

## 2011-01-04 ENCOUNTER — Telehealth: Payer: Self-pay | Admitting: Internal Medicine

## 2011-01-04 MED ORDER — LEVOFLOXACIN 750 MG PO TABS: ORAL_TABLET | ORAL | Status: DC

## 2011-01-04 NOTE — Telephone Encounter (Signed)
Head congestion, prod cough with yellow mucus, wheezing, increased SOB x3-4days.  Has been using neb and taking mucinex.   Allergies: biaxin.  [facility] on [address].

## 2011-01-04 NOTE — Telephone Encounter (Signed)
Ok to call in levaquin 750mg  one a day for 5days.  Call us if not improving.

## 2011-01-04 NOTE — Telephone Encounter (Signed)
Spoke w/ pt and advised her of kc recs. Pt verbalized understanding and will try this and see

## 2011-01-11 ENCOUNTER — Ambulatory Visit: Payer: Medicare Other | Admitting: Cardiovascular Disease

## 2011-01-12 ENCOUNTER — Ambulatory Visit (INDEPENDENT_AMBULATORY_CARE_PROVIDER_SITE_OTHER): Payer: Medicare Other

## 2011-01-12 DIAGNOSIS — J45909 Unspecified asthma, uncomplicated: Secondary | ICD-10-CM

## 2011-01-12 MED ORDER — OMALIZUMAB 150 MG ~~LOC~~ SOLR
225.0000 mg | Freq: Once | SUBCUTANEOUS | Status: AC
  Administered 2011-01-12: 225 mg via SUBCUTANEOUS

## 2011-01-14 ENCOUNTER — Other Ambulatory Visit: Payer: Self-pay | Admitting: Internal Medicine

## 2011-01-16 ENCOUNTER — Inpatient Hospital Stay (HOSPITAL_COMMUNITY): Payer: Medicare Other

## 2011-01-16 ENCOUNTER — Inpatient Hospital Stay (HOSPITAL_COMMUNITY)
Admission: AD | Admit: 2011-01-16 | Discharge: 2011-01-18 | DRG: 310 | Disposition: A | Discharge status: Home / Self Care | Payer: Medicare Other | Source: Ambulatory Visit | Attending: Interventional Cardiology | Admitting: Interventional Cardiology

## 2011-01-16 DIAGNOSIS — Z7901 Long term (current) use of anticoagulants: Secondary | ICD-10-CM

## 2011-01-16 DIAGNOSIS — J449 Chronic obstructive pulmonary disease, unspecified: Secondary | ICD-10-CM | POA: Diagnosis present

## 2011-01-16 DIAGNOSIS — IMO0002 Reserved for concepts with insufficient information to code with codable children: Secondary | ICD-10-CM

## 2011-01-16 DIAGNOSIS — J4489 Other specified chronic obstructive pulmonary disease: Secondary | ICD-10-CM | POA: Diagnosis present

## 2011-01-16 DIAGNOSIS — I498 Other specified cardiac arrhythmias: Secondary | ICD-10-CM | POA: Diagnosis present

## 2011-01-16 DIAGNOSIS — E039 Hypothyroidism, unspecified: Secondary | ICD-10-CM | POA: Diagnosis present

## 2011-01-16 DIAGNOSIS — I4892 Unspecified atrial flutter: Principal | ICD-10-CM | POA: Diagnosis present

## 2011-01-16 LAB — CARDIAC PANEL(CRET KIN+CKTOT+MB+TROPI)
CK, MB: 12.3 ng/mL (ref 0.3–4.0)
Total CK: 174 U/L (ref 7–177)

## 2011-01-16 LAB — BRAIN NATRIURETIC PEPTIDE: Pro B Natriuretic peptide (BNP): 101 pg/mL — ABNORMAL HIGH (ref 0.0–100.0)

## 2011-01-16 LAB — APTT: aPTT: 33 seconds (ref 24–37)

## 2011-01-16 LAB — CBC
MCH: 30.8 pg (ref 26.0–34.0)
MCV: 87.9 fL (ref 78.0–100.0)
Platelets: 258 10*3/uL (ref 150–400)
RBC: 4.55 MIL/uL (ref 3.87–5.11)

## 2011-01-16 LAB — MRSA PCR SCREENING: MRSA by PCR: NEGATIVE

## 2011-01-17 DIAGNOSIS — I4892 Unspecified atrial flutter: Secondary | ICD-10-CM

## 2011-01-17 LAB — BASIC METABOLIC PANEL
Calcium: 9.8 mg/dL (ref 8.4–10.5)
GFR calc Af Amer: >60 mL/min (ref >60)
GFR calc non Af Amer: >60 mL/min (ref >60)
Sodium: 136 mEq/L (ref 135–145)

## 2011-01-17 LAB — CARDIAC PANEL(CRET KIN+CKTOT+MB+TROPI)
CK, MB: 9.6 ng/mL (ref 0.3–4.0)
CK, MB: 8.8 ng/mL (ref 0.3–4.0)
Relative Index: 7.4 — ABNORMAL HIGH (ref 0.0–2.5)
Total CK: 129 U/L (ref 7–177)
Total CK: 146 U/L (ref 7–177)
Troponin I: 0.02 ng/mL (ref 0.00–0.06)

## 2011-01-17 LAB — HEPARIN LEVEL (UNFRACTIONATED): Heparin Unfractionated: <0.1 IU/mL — ABNORMAL LOW (ref 0.30–0.70)

## 2011-01-17 LAB — CBC
MCHC: 33.9 g/dL (ref 30.0–36.0)
RDW: 13.5 % (ref 11.5–15.5)

## 2011-01-18 LAB — BASIC METABOLIC PANEL
BUN: 17 mg/dL (ref 6–23)
Chloride: 101 mEq/L (ref 96–112)
Potassium: 3.2 mEq/L — ABNORMAL LOW (ref 3.5–5.1)
Sodium: 141 mEq/L (ref 135–145)

## 2011-01-18 LAB — CBC
HCT: 38.7 % (ref 36.0–46.0)
MCHC: 33.1 g/dL (ref 30.0–36.0)
MCV: 88.6 fL (ref 78.0–100.0)
RDW: 13.3 % (ref 11.5–15.5)
WBC: 8.6 10*3/uL (ref 4.0–10.5)

## 2011-01-18 LAB — PROTIME-INR: INR: 1.3 (ref 0.00–1.49)

## 2011-01-19 ENCOUNTER — Encounter: Payer: Self-pay | Admitting: Internal Medicine

## 2011-01-19 ENCOUNTER — Ambulatory Visit (INDEPENDENT_AMBULATORY_CARE_PROVIDER_SITE_OTHER): Payer: Medicare Other | Admitting: Internal Medicine

## 2011-01-19 ENCOUNTER — Telehealth: Payer: Self-pay | Admitting: Internal Medicine

## 2011-01-19 VITALS — BP 132/60 | HR 95 | Ht 65.0 in | Wt 173.4 lb

## 2011-01-19 DIAGNOSIS — G4733 Obstructive sleep apnea (adult) (pediatric): Secondary | ICD-10-CM

## 2011-01-19 DIAGNOSIS — J984 Other disorders of lung: Secondary | ICD-10-CM

## 2011-01-19 DIAGNOSIS — J449 Chronic obstructive pulmonary disease, unspecified: Secondary | ICD-10-CM

## 2011-01-19 NOTE — Patient Instructions (Signed)
Take 1/4 of the furosemide tablet, just this once   = 20 mg Take 1 KDUR tab Take 1 extra prednisone today. You may want to take a prednisone 5  Mg tab everyday for the next 2-3 days if any question.

## 2011-01-19 NOTE — Assessment & Plan Note (Addendum)
Timing suggests her worse breathing now may be from relative fluid retention because she was in abnormal rhythm. We will have her take 20 mg lasix x1, with one of husband's KDUR and an extra prednisone. This is deliberately gentle and may not be enough.

## 2011-01-19 NOTE — Progress Notes (Signed)
  Subjective:    Patient ID: [name], female    DOB: 06/18/1936, 75 y.o.   MRN: [id number]  HPI 53/12- 46 yo former smoker with severe COPD/ chronic hypoxic respiratory failure, hx lung nodule treated with XRT as presumptive Ca w/o bx. OSA  Husband here. Last here November 28, 2010. Since then was hosp for tachyarrythmia- considered for ablation. She was concerned about risk of being put to sleep and wanted to discuss it here.  Now feels sore at strap level around chest, very congested and wheezy. Nebulizer helps but needing frequently. Malaise. Sputum white, no fever. Feet feel still a little swollen. Continues prednisone 5 mg every other day.   Review of Systems Constitutional:   No weight loss, night sweats,  Fevers, chills, fatigue, lassitude. HEENT:   No headaches,  Difficulty swallowing,  Tooth/dental problems,  Sore throat,                No sneezing, itching, ear ache, nasal congestion, post nasal drip,   CV:  No chest pain,  Orthopnea, PND, swelling in lower extremities, anasarca, dizziness, palpitations  GI  No heartburn, indigestion, abdominal pain, nausea, vomiting, diarrhea, change in bowel habits, loss of appetite  Resp:No excess mucus, No coughing up of blood.  No change in color of mucus.   Skin: no rash or lesions.  GU: no dysuria, change in color of urine, no urgency or frequency.  No flank pain.  MS:  No joint pain or swelling.  No decreased range of motion.  No back pain.  Psych:  No change in mood or affect. No depression or anxiety.  No memory loss.      Objective:   Physical Exam General- Alert, Oriented, Affect-appropriate, Distress- none acute   O2 on 3.5 L/M here  Skin- rash-none, lesions- none, excoriation- none  Lymphadenopathy- none  Head- atraumatic  Eyes- Gross vision intact, PERRLA, conjunctivae clear secretions  Ears- Hearing, canals, Tm- normal  Nose- Clear, No- Septal dev, mucus, polyps, erosion, perforation   Chronic nasal quality to  speech  Throat- Mallampati III , mucosa clear , drainage- none, tonsils- atrophic  Neck- flexible , trachea midline, no stridor , thyroid nl, carotid no bruit  Chest - symmetrical excursion , unlabored     Heart/CV- RRR , no murmur , no gallop  , no rub, nl s1 s2                     - JVD- none , edema- none, stasis changes- none, varices- none     Lung- , wheeze- right  Chest ,     , dullness-none, rub- none.      Raspy cough     Chest wall-   Abd- tender-no, distended-no, bowel sounds-present, HSM- no  Br/ Gen/ Rectal- Not done, not indicated  Extrem- cyanosis- none, clubbing, none, atrophy- none, strength- nl  Neuro- grossly intact to observation         Assessment & Plan:

## 2011-01-19 NOTE — Telephone Encounter (Signed)
Spoke w/ pt and she states she is having a hard time breathing, cough w/ clear phlem, pain in b/t her shoulder blades and is severely congested. Pt states she can barley catch her breath.  Pt was d/c'd from hospital yesterday but was in their due to her heart beng out of rhythm. Pt is coming in today at 2:15 to see Dr. Maple Hudson.

## 2011-01-24 NOTE — Consult Note (Signed)
[name], [name]                ACCOUNT NO.:  [id number]  MEDICAL RECORD NO.:  [id number]           PATIENT TYPE:  I  LOCATION:  2030                         FACILITY:  [facility]  PHYSICIAN:  Hillis Range, MD       DATE OF BIRTH:  1935-09-25  DATE OF CONSULTATION:  01/17/2011 DATE OF DISCHARGE:                                CONSULTATION   REQUESTING PHYSICIAN:  Corky Crafts, MD  REASON FOR CONSULTATION:  Atrial flutter.  HISTORY OF PRESENT ILLNESS:  [name] is a pleasant 75 year old female with multiple comorbidities including COPD on chronic home oxygen, sleep apnea, and obesity who presents with symptomatic atrial flutter.  The patient previously presented in January of this year with symptoms of heart racing and was found to have supraventricular tachycardia, which was treated with adenosine.  Per the discharge summary, the adenosine had no effect on the arrhythmia, and the patient therefore was placed on Cardizem drip, which then converted her to sinus rhythm.  She apparently did well without any further arrhythmias until her presentation on January 16, 2011, when she developed recurrent symptomatic palpitations.  She reports associated shortness of breath.  She therefore presented to [facility] for further evaluation and was found to have atrial flutter with ventricular rates in the 150s.  She was placed on IV Cardizem and overnight converted from atrial flutter to sinus rhythm.  Presently, she is resting comfortably and is without complaint.  PAST MEDICAL HISTORY: 1. COPD, on chronic home oxygen. 2. Chronic steroid use. 3. Hypothyroidism. 4. GERD. 5. Sleep apnea. 6. Anxiety. 7. Depression. 8. Diverticulosis. 9. Lung cancer, status post XRT in 2011.  PAST SURGICAL HISTORY: 1. Hysterectomy. 2. Appendectomy.  MEDICATIONS:  Reviewed in the [facility].  ALLERGIES:  AVELOX.  FAMILY HISTORY:  Notable for coronary artery disease and diabetes.  SOCIAL HISTORY:  The  patient is married.  She is a former smoker.  She denies alcohol or drug use.  REVIEW OF SYSTEMS:  All systems are reviewed and negative except as outlined in the HPI above.  PHYSICAL EXAMINATION:  Telemetry reveals sinus rhythm. VITALS:  Blood pressure 133/58, heart rate 84, respirations 18, sats 91% on 2 L. GENERAL:  The patient is a chronically ill-appearing elderly female in no acute distress.  She is alert and oriented x3. HEENT:  Normocephalic, atraumatic.  Sclerae clear.  Conjunctivae pink. Oropharynx clear. NECK:  Supple.  JVP 9 cm. LUNGS:  Diffuse expiratory wheeze with a prolonged expiratory phase. HEART:  Regular rate and rhythm.  No murmurs, rubs, or gallops. GI:  Soft, nontender, and nondistended.  Positive bowel sounds. EXTREMITIES:  No clubbing, cyanosis, or edema. SKIN:  No ecchymoses or lacerations. MUSCULOSKELETAL:  Diffuse muscle atrophy.  EKG is reviewed and reveals typical-appearing atrial flutter.  I have also reviewed an EKG from October 12, 2010, which reveals a narrow complex tachycardia at 216 beats per minute.  This could represent 1:1 conducting atrial flutter or more likely supraventricular tachycardia.  Chest x-ray reveals emphysema.  Echo from November 01, 2010, reveals an ejection fraction of 65-70% with no significant valvular abnormalities.  Left  atrial size is said to be 2.7 cm.  LABORATORY DATA:  TSH 0.749.  Hematocrit 40.  IMPRESSION:  [name] is a pleasant 75 year old female who now presents with symptomatic typical-appearing atrial flutter.  We had a long discussion today regarding therapeutic strategies.  Given her comorbidities, I think that we should avoid antiarrhythmic medicines at this time.  I would therefore recommend that we continue with Cardizem. I have therefore placed her on Cardizem CD 240 mg twice daily.  We will stop IV Cardizem and observe the patient on telemetry.  We had a long discussion about risks, benefits, and  alternatives to do EP study and radiofrequency ablation today.  Presently, the patient and her spouse are quite clear that they wish to avoid catheter ablation if possible. If she develops recurrent atrial flutter, however, they would be more willing to consider catheter ablation down the road.  We will plan therefore to discontinue the patient on Coumadin and Cardizem; and I will have her follow up with me in the outpatient setting in 4 weeks.     Hillis Range, MD     JA/MEDQ  D:  01/17/2011  T:  01/18/2011  Job:  [id number]  cc:   Corky Crafts, MD  Electronically Signed by Hillis Range MD on 01/24/2011 08:18:16 PM

## 2011-01-29 ENCOUNTER — Encounter: Payer: Self-pay | Admitting: Internal Medicine

## 2011-01-29 NOTE — Assessment & Plan Note (Signed)
Good compliance and control with CPAP 

## 2011-01-30 ENCOUNTER — Ambulatory Visit (INDEPENDENT_AMBULATORY_CARE_PROVIDER_SITE_OTHER): Payer: Medicare Other

## 2011-01-30 DIAGNOSIS — J45909 Unspecified asthma, uncomplicated: Secondary | ICD-10-CM

## 2011-01-30 MED ORDER — OMALIZUMAB 150 MG ~~LOC~~ SOLR
225.0000 mg | Freq: Once | SUBCUTANEOUS | Status: AC
  Administered 2011-01-30: 225 mg via SUBCUTANEOUS

## 2011-01-31 NOTE — Discharge Summary (Signed)
NAME:  [name], [name]                ACCOUNT NO.:  [id number]   MEDICAL RECORD NO.:  [id number]          PATIENT TYPE:  INP   LOCATION:  6737                         FACILITY:  [facility]   PHYSICIAN:  Clinton D. Maple Hudson, MD, FCCP, FACPDATE OF BIRTH:  Sep 20[birth year] 1937   DATE OF ADMISSION:  11/01/2008  DATE OF DISCHARGE:  11/06/2008                               DISCHARGE SUMMARY   DISCHARGE DIAGNOSES:  1. Chronic obstructive pulmonary disease with acute exacerbation.  2. Pneumonia, organism, not otherwise specified.  3. Obstructive sleep apnea.   BRIEF HISTORY:  A 75 year old woman with severe COPD, FEV-1 of 46%  predicted 1 year prior to admission.  She had been seen in the office 4  days prior to admission, having recently started home oxygen.  She had  been receiving Xolair injection for IgE positive asthma component, which  had been steroid-dependent and she had been using the home nebulizers.  She presented to the emergency room wheezing and dyspneic with  respiratory pains across the mid back to right scapula, dry cough, and  chilling for 2 days prior to admission.  Although she denied fever,  bloody or purulent sputum.  She was treated initially in the emergency  room with Solu-Medrol nebulized treatments and Rocephin.  Chest x-ray  showed patchy infiltrates, especially in the right upper lobe, but no  effusion or cardiomegaly.  Initial EKG showed sinus tachycardia with  PVCs, but cardiac enzymes were not elevated.  Initial white blood count  was 17,400.  B-Natriuretic Peptide was 67.  Physical exam on admission  temperature 98.4, pulse 134, slowing to 128, and sinus tachycardia by  monitor with respirations of 22, mild kyphosis, poor air flow with slow  expiratory phase, rhonchi, regular heart sounds, no edema.  Negative  Homan sign.  Admission was required for medical stabilization.  Initial  antibiotic coverage was suspected early community-acquired pneumonia was  with Rocephin and  Zithromax.  COPD management was with Solu-Medrol  nebulized bronchodilators, supplemental oxygen, and continuation of the  outpatient meds.  CT of the chest was performed to rule out pulmonary  embolism because of the hypoxia.  She gradually improved and at  discharge she felt well after return to her home environment with  previously scheduled outpatient follow up.  She denies Diflucan as  temporary treatment for yeast.  Exam still showed poor air flow, but  much less wheeze that it showed community-acquired pneumonia, which had  resolved COPD exacerbation, which had improved bilateral lung nodules  for followup.  A hyperlucent renal lesion on CT for followup and  obstructive sleep apnea to continue on her home CPAP at 8 CWP.   LABORATORY:  EKG showed sinus tachycardia, PVCs, and then a Barrett  complex, borderline ischemic changes.  CT angiogram of the chest showed  no evidence of acute pulmonary thromboembolism.  There was pulmonary  scarring with chronic change.  A 1.5 x 1-cm nodule was found in the left  upper lobe and a smaller spiculated nodule in the right upper lobe.  There was also a partially imaged hypodensity. In the left kidney, and  a  question of a calcified right thyroid nodule.  Initial WBC 17,400 with  hemoglobin of 15.7 and 86% neutrophils.  B-Natriuretic Peptide was 67.  Theophylline level 4.0.  Cultures grew no growth.  Urine culture grew  20,000 colonies of multiple organisms.   DISCHARGE PLANS:  She was to return for pulmonary followup with Dr.  Maple Hudson as scheduled and Dr. Jerelyn Scott and was seen for primary care as  scheduled.   DIET:  As tolerated.   ACTIVITY:  As tolerated.  Continuous oxygen at 2 L per minute.   MEDICATIONS:  1. Xolair 225 mg IM every 2 weeks.  2. Oxygen 2-4 L per minute continuously.  3. Prednisone 10 mg tabs for an 8-day tapered from 40 mg daily until      she returned to baseline chronic management at 5 mg daily.  4. Theophylline CR 100 mg  b.i.d.  5. Advair 250/50 one puff and rinse b.i.d.  6. Nebulized DuoNeb/albuterol 2.5 mg with ipratropium 0.5 mg q.i.d.      p.r.n. dyspnea.  7. Singulair 10 mg daily.  8. Rescue inhaler, ProAir whichever one she has, 2 puffs q.i.d. p.r.n.  9. Fluticasone nasal spray 2 puffs each nostril daily.  10.EpiPen 0.3 mg for severe allergic reaction.  11.Mucinex 600 mg once or twice daily p.r.n.  12.Synthroid 75 mcg daily.  13.Premarin 0.625 mg daily.  14.Diflucan 150 mg daily x3 days.  15.Actonel 35 mg each week.   DISCHARGE CONDITION:  Improved.      Clinton D. Maple Hudson, MD, Tonny Bollman, FACP  Electronically Signed     CDY/MEDQ  D:  12/16/2008  T:  12/17/2008  Job:  [id number]   cc:   Georgann Housekeeper, MD

## 2011-01-31 NOTE — Assessment & Plan Note (Signed)
[facility]                             PULMONARY OFFICE NOTE   NAME:[name], [name]                       MRN:          [id number]  DATE:07/23/2007                            DOB:          Dec 07, 1935    PROBLEM:  1. Allergic rhinitis.  2. Chronic obstructive pulmonary disease/asthma.  3. Chronic nasal obstruction.  4. Obstructive sleep apnea.   HISTORY:  She saw the nurse practitioner in mid-September with an asthma  flare and was thought also to have some reflux.  She was having some  atypical chest and back pains.  Since then she has been feeling better.  She did experience another episode of bronchitis in mid-October, and was  called in a prednisone taper and Z-Pak.  The humidifier machine on her  CPAP has begun leaking and [employer] suggested that it was time  to replace the 73 year old machine.  The pressure is set at 8.  She has  had a flu vaccine and remote history of a single pneumococcal vaccine.  Dr. Georgann Housekeeper gave her Spiriva, but she waited to try it until she  could discuss with me.  I encouraged her to go ahead and to let us know  if she thought it provided benefit.   MEDICAL DECISION MAKING:  1. Theophylline 150 mg twice daily.  2. Budeprion SR 150 mg.  3. Synthroid 75 mcg.  4. Premarin.  5. Advair 250/50 mg.  6. Nasonex.  7. Actonel 35 mg nebulizer with DuoNeb, usually used twice daily.  8. Singulair 10 mg.  9. Aricept 10 mg.  10.She has a rescue albuterol inhaler.  11.Occasional Mucinex.   /INTOLERANCES/ALLERGIES:  Intolerance possibly to BIAXIN.   OBJECTIVE:  VITAL SIGNS:  Weight 173 pounds, blood pressure 142/68,  pulse 91, room air saturation 92%.  LUNGS:  Slight rattle with one single cough but quiet breathing the  lungs are very clear.  HEART:  Sounds regular without murmur.  HEENT:  The chronic nasal speech quality is again noted.  No postnasal  drip is seen.   IMPRESSION:  1. Obstructive sleep  apnea, well-controlled on CPAP at 8.  2. Rhinitis, possible recurrent rhinosinusitis.  3. Two episodes of bronchitis this fall.   PLAN:  1. A pneumococcal vaccine booster with discussion.  2. Go ahead and try the Spiriva.  3. Replacement of CPAP machine.  To continue at 8 CWP.  4. Schedule a return appointment in four months or earlier p.r.n.     Clinton D. Maple Hudson, MD, Tonny Bollman, FACP  Electronically Signed    CDY/MedQ  DD: 07/23/2007  DT: 07/24/2007  Job #: [id number]   cc:   Georgann Housekeeper, MD

## 2011-01-31 NOTE — H&P (Signed)
NAME:  [name], [name]                ACCOUNT NO.:  [id number]   MEDICAL RECORD NO.:  [id number]          PATIENT TYPE:  INP   LOCATION:  6737                         FACILITY:  [facility]   PHYSICIAN:  Clinton D. Maple Hudson, MD, FCCP, FACPDATE OF BIRTH:  18-Apr-1938   DATE OF ADMISSION:  11/01/2008  DATE OF DISCHARGE:                              HISTORY & PHYSICAL   ADMISSION DIAGNOSES:  1. Community-acquired pneumonia.  2. Chronic obstructive pulmonary disease with acute exacerbation.   HISTORY OF PRESENT ILLNESS:  This is a 75 year old former smoker with  severe COPD, FEV-1 46% last year.  She had seen me in the office 4 days  prior to admission having recently started on home oxygen continuously,  Xolair injection for IgE positive asthma component, steroid dependent on  low-dose maintenance prednisone.  She has been using the home nebulizer.  Three days prior to admission, about 4:45 p.m. on Friday, they called  the office for increased dyspnea and the nurse suggested they call EMT  which gave a nebulizer treatment with benefit overnight.  By today, she  was again wheezing and dyspneic with somewhat pleuritic pain across the  mid back to right scapula, dry cough and chilling over the past 2 days.  She denies fever, myalgias, purulent or bloody discharge.  She has been  treated in the emergency room with nebulizer, Solu-Medrol, and Rocephin  after blood cultures.  I have reviewed the chest x-ray which shows COPD  with patchy infiltrates, especially in the right upper lobe but no  effusion or cardiomegaly.  Other laboratory shows EKG with sinus  tachycardia, PVCs, P-pulmonale and some questionable repolarization  changes.  Cardiac enzymes are not elevated and troponin I is less than  0.05.  White blood count 17,400, hemoglobin 15.7, and B-natriuretic  peptide 67.   REVIEW OF SYSTEMS:  She denies rash or itching, fever or sweats,  adenopathy, headache, syncope, chest pain or palpitation,  heartburn,  indigestion, nausea or vomiting or diarrhea, joint pain, leg discomfort,  or ankle edema.  She does complain of pain across the mid back as  described which is new in the last day or so and may be related to  cough.   PAST MEDICAL HISTORY:  1. Recurrent rhinosinusitis.  2. Obstructive sleep apnea, on CPAP at 8 CWP.  3. COPD.  4. Asthma.  5. Allergic rhinitis.   PAST SURGICAL HISTORY:  1. Total abdominal hysterectomy.  2. Tonsillectomy.  3. Cyst from scalp.  4. Bladder tack.  5. Appendectomy.   SOCIAL HISTORY:  Former smoker.  Married.   FAMILY HISTORY:  Some thought to have asthma and sleep apnea.   OBJECTIVE:  GENERAL:  She is seen as an elderly woman sitting on the  edge of gurney.  She is alert and conversational, holding tripod  posture.  VITAL SIGNS:  Temperature 98.4, BP 145/100, pulse initially 134 is slow  to 128, sinus tachycardia by monitor, and respirations 22.  SKIN:  Warm and dry.  ADENOPATHY:  None found.  HEENT:  There is a partial plate and denture.  Oral mucosa clear.  No  stridor.  No neck vein distention.  Hearing and vision are grossly  intact.  Conjunctivae are clear and extraocular motion is normal.  CHEST:  Mild kyphosis.  She indicates some mild tenderness from pressure  over the mid back bilaterally, particularly around the right scapula.  Air flow was poor with slow expiratory phase and faint bilateral  rhonchi.  CARDIAC:  Heart sounds are regular without murmur or gallop.  ABDOMEN:  Soft.  BREASTS/PELVIC/RECTAL:  Not examined, noncontributory.  EXTREMITIES:  No edema, cords, cyanosis, or clubbing.  Homan sign is  negative bilaterally.   IMPRESSION:  1. Community-acquired pneumonia.  She is up-to-date on pneumococcal      vaccine and seasonal flu vaccine.  I doubt H1N1.  Note that, she is      intolerant to Biaxin and codeine.  The emergency room has already      drawn blood cultures and started Rocephin.  We will use Rocephin       and Zithromax and Mucinex.  2. Severe chronic obstructive pulmonary disease, oxygen and low-dose      prednisone dependent.  We will give Solu-Medrol nebulizers an      oxygen.  3. Allergic asthma.  She is started on outpatient Xolair injections      which will be managed through the office.   ADDENDUM:   HOME MEDICATIONS:  1. Xolair 225 mg IM every 2 weeks.  2. Prednisone 5 mg daily.  3. Spiriva once daily.  4. Albuterol rescue inhaler 2 puffs q.i.d. p.r.n.  5. Singulair 10 mg daily.  6. Home nebulizer with albuterol 2.5 mg/ipratropium 0.5 mg up to      q.i.d. p.r.n.  7. Advair Diskus 250/50 once b.i.d.  8. Theophylline 100 mg recorded as taking 1/2 tablet twice daily.  9. CPAP at 8 CWP.  10.Home oxygen 2 L/minute.      Clinton D. Maple Hudson, MD, Tonny Bollman, FACP  Electronically Signed     CDY/MEDQ  D:  11/01/2008  T:  11/02/2008  Job:  [id number]

## 2011-01-31 NOTE — Letter (Signed)
March 17, 2009   Clinton D. Maple Hudson, MD, FCCP, FACP  [facility]Pulmonary [facility]  [address]  [place], [place] [address]   Re:  [name], [name]                DOB:  [birth year]   Dear Joni Fears,   I saw the patient back today.  Unfortunately her left upper lobe nodule  has increased in size, it is 16 x 13 mm where it previously was 15 x 8  mm.  As you know the needle biopsy showed just atypical cells.  I think  this is cancer, and I have recommended that we get a repeat biopsy.  I  am going to refer her to Dr. Edmonia Lynch at [facility] for  electromagnetic navigational bronchoscopy and if this cannot be done,  then we will have to do a repeat needle biopsy.  She did have  subcutaneous air with the last biopsy.  Her blood pressure was 174/95,  pulse 100, respirations 20, and saturation were 92% on oxygen.  Lungs  were clear.  She is presently still on prednisone.  She is given a short  course of Augmentin.  I will see her back again after her bronchoscopy.   Ines Bloomer, M.D.  Electronically Signed   DPB/MEDQ  D:  03/17/2009  T:  03/18/2009  Job:  [id number]

## 2011-01-31 NOTE — Discharge Summary (Signed)
  [name], [name]                ACCOUNT NO.:  [id number]  MEDICAL RECORD NO.:  [id number]           PATIENT TYPE:  I  LOCATION:  2030                         FACILITY:  [facility]  PHYSICIAN:  Corky Crafts, MDDATE OF BIRTH:  [birth year]  DATE OF ADMISSION:  01/16/2011 DATE OF DISCHARGE:  01/18/2011                              DISCHARGE SUMMARY   DISCHARGE DIAGNOSES: 1. Atrial flutter. 2. Supraventricular tachycardia, different from the atrial flutter. 3. Chronic obstructive pulmonary disease. 4. Hypothyroidism.  PROCEDURES PERFORMED:  EP consultation with Dr. Hillis Range.  HOSPITAL COURSE:  The patient was admitted after coming to our office in atrial flutter.  She was given dose of IV Cardizem, but did not convert to normal rhythm.  She did slow down somewhat.  She was admitted and started on a Cardizem drip.  After a few hours, she did convert back to normal sinus rhythm.  Her TSH was checked and it was 0.749 which is within the normal range.  She felt well.  She was maintained on her usual home oxygen level which is about 3 liters.  She was started on Coumadin and tolerated this well.  After discussion with Dr. Johney Frame and the fact that she stayed in sinus rhythm on her higher dose of Cardizem, it was decided that she could go home with outpatient followup for Coumadin check.  DISCHARGE MEDICATIONS: 1. Tylenol 650 mg q.4 h. p.r.n. 2. Diltiazem 240 mg p.o. b.i.d.  This is a new dose for the patient. 3. Warfarin 5 mg daily.  Dose will be adjusted in our office. 4. Actonel. 5. Advair 1 puff b.i.d. 6. Albuterol 2 puffs q.4 h. p.r.n. 7. DuoNeb. 8. EpiPen. 9. Flonase 1-2 sprays daily. 10.Mucinex. 11.Multivitamin. 12.Prednisone 5 mg daily. 13.Premarin. 14.Singulair 10 mg daily. 15.Spiriva 80 mcg daily. 16.Synthroid 75 mcg daily. 17.Theophylline 150 mg twice a day. 18.Vitamin C. 19.Wellbutrin 150 mg twice a day. 20.Xolair 225 mg IM every 2 weeks.  FOLLOWUP  APPOINTMENTS:  At Dr. Hoyle Barr office for Coumadin check on Jan 19, 2009, at 10:45 a.m. and then with Dr. Eldridge Dace on Feb 07, 2011, at 2 p.m.  DIET:  Low sodium, heart healthy.  ACTIVITY:  Increase activity slowly, otherwise as tolerated.  Continue to use home oxygen.     Corky Crafts, MD     JSV/MEDQ  D:  01/18/2011  T:  01/18/2011  Job:  [id number]  Electronically Signed by Lance Muss MD on 01/31/2011 08:10:22 AM

## 2011-01-31 NOTE — Op Note (Signed)
[name], [name]                ACCOUNT NO.:  [id number]   MEDICAL RECORD NO.:  [id number]          PATIENT TYPE:  OIB   LOCATION:  3306                         FACILITY:  [facility]   PHYSICIAN:  Evelene Croon, M.D.     DATE OF BIRTH:  1935/09/22   DATE OF PROCEDURE:  12/25/2008  DATE OF DISCHARGE:                               OPERATIVE REPORT   PREOPERATIVE DIAGNOSIS:  Left pneumothorax with massive subcutaneous  emphysema.   POSTOPERATIVE DIAGNOSIS:  Left pneumothorax with massive subcutaneous  emphysema.   PROCEDURE:  Insertion of left chest tube.   SURGEON:  Evelene Croon, MD   ANESTHESIA:  A 1% lidocaine local anesthesia.   CLINICAL HISTORY:  This patient is a 75 year old woman who underwent a  left lung biopsy yesterday by Interventional Radiology.  She apparently  had a pneumothorax after the procedure and had a small chest drain  placed to treat this.  Her chest x-ray this morning showed a small left  pneumothorax, but there was no visible air leak and apparently the chest  tube was removed by Interventional Radiology, and the patient was  discharged.  When her family tried to take her up, she suddenly  developed a massive subcutaneous emphysema and some respiratory  distress.  I was called emergently to evaluate the patient.  She had a  chest x-ray performed emergently, which showed bilateral subcutaneous  emphysema.  There appeared to be some shift at the right of the  mediastinum.  The left hemidiaphragm was pushed downwards suggesting  large left pneumothorax.  I felt placement of left chest tube would be  the best initial treatment since she had her biopsy on the left side.  Verbal informed consent was obtained from the patient.   PROCEDURE:  The left chest prepped with Betadine solution and draped in  the usual sterile manner.  A 1% lidocaine local anesthesia was used to  anesthetize the skin and subcutaneous tissue and the left anterior  axillary line just below  the left breast.  A 1-cm incision was made and  blunt dissection was continued down to the intercostal space.  I tried  to anesthetize this area as well as possible, but it was quite deep  considering her obesity and the amount of subcutaneous emphysema.  Then,  the pleural space was entered bluntly with a Kelly clamp and there was a  large rush of air.  A 28-French chest tube was inserted through this  intercostal space and the trocar was slightly withdrawn.  The tube was  advanced, so that the tip was at the apex of the chest.  It was  connected to the Pleur-Evac suction.  There was continuous air leak  present.  The chest tube was fixed to the skin with a silk suture, and a  dry sterile dressing applied around the chest tube.  It was firmly taped  in place.  The patient tolerated the procedure well.  The followup chest  x-ray showed the tube in excellent position with its tip at the apex  with resolution of the pneumothorax.  Evelene Croon, M.D.  Electronically Signed    BB/MEDQ  D:  12/25/2008  T:  12/27/2008  Job:  [id number]

## 2011-01-31 NOTE — Discharge Summary (Signed)
NAMEGIULIANNA, [name]                ACCOUNT NO.:  [id number]   MEDICAL RECORD NO.:  [id number]          PATIENT TYPE:  INP   LOCATION:  3306                         FACILITY:  [facility]   PHYSICIAN:  Ines Bloomer, M.D. DATE OF BIRTH:  [birth year]   DATE OF ADMISSION:  12/24/2008  DATE OF DISCHARGE:                               DISCHARGE SUMMARY   ADMITTING DIAGNOSES:  1. Left upper lobe lung lesion.  2. Left pneumothorax (status post left lung biopsy December 24, 2008).  3. History of chronic obstructive pulmonary disease.  4. History of obstructive sleep apnea (on CPAP).  5. History of asthma.   DISCHARGE DIAGNOSES:  1. Left upper lobe lung lesion.  2. Left pneumothorax (status post left lung biopsy December 24, 2008).  3. History of chronic obstructive pulmonary disease.  4. History of obstructive sleep apnea (on CPAP).  5. History of asthma.   HISTORY OF PRESENTING ILLNESS:  This is a 75 year old female with a past  medical history of tobacco abuse, COPD, asthma, OSA who was found to  have a left upper lobe lesion.  A PET scan was positive in this area and  the SUV was 24.  On CT of the chest, it was also a couple bullae next to  this left upper lobe lesion.  The patient is oxygen dependent and was  referred to Dr. Edwyna [name] on December 07, 2008.  It was decided that the  patient needed to undergo a needle biopsy of this left upper lobe  lesion; however, she would be at risk for pneumothorax.  The patient was  admitted to [facility] December 24, 2008, where she underwent a CT-guided  aspiration of the left upper lobe lung biopsy.  Cytology results  revealed rare atypical cells present.  A chest x-ray that was done after  the procedure revealed a 5% left apical pneumothorax; however, the  patient then developed sudden acute onset of breath and other chest x-  ray that was done revealed a much larger left pneumothorax (encompassing  7% of the hemothoracic volume).  A chest tube was then placed  under  fluoroscopy.  Followup chest x-ray revealed no residual pneumothorax;  however, a subsequent chest x-ray did reveal a possible small left  apical pneumothorax (5% or less).  Eventually this chest tube was  removed.  The patient was afebrile, vital signs were stable, and she was  going to be discharged; however, rapid response was called because the  patient was in respiratory distress, decreased O2 sats requiring a non-  rebreather, facial, neck, and chest swelling.  A chest x-ray that was  done showed interval development of a large left pneumothorax with  extensive subcutaneous air.  Dr. Laneta [name] was consulted emergently, he  placed a left chest tube.  Followup chest x-ray revealed left chest tube  was in satisfactory position with re-expansion of the left lung.  Chest  tube remained in place for a couple of days.  There was no air leak  noted.  Gradually the subcutaneous emphysema began resolving.  Chest  tube was then placed to water seal  on December 27, 2008.  Again, there was  very minimal CT drainage and no air leak noted.  Chest tube was removed  on December 28, 2008.  Followup chest x-ray revealed no definite left  pneumothorax, subcutaneous emphysema, mild generalized left lung  airspace disease (especially at left lung base), right lung clear.  Provided the patient remains afebrile, hemodynamically stable, and chest  x-ray in the morning shows no change, the patient will be discharged on  December 29, 2008.   Discharge instructions include the following, the patient may remain on  a regular diet.  She is instructed she may shower.  She is not to lift  or drive for 1 week.  She has a followup appointment to see Dr. Edwyna [name]  on January 05, 2009, at 2:30 p.m.  Prior to this office appointment, a  chest x-ray will be obtained.  The patient may use soap and water on her  wounds.  First chest tube suture will be removed at the office  appointment with Dr. Edwyna [name].   Latest laboratory  study results reveal the following, last BMET done on  December 24, 2008, potassium 3.7, BUN/creatinine 16/0.8 respectively.  Last  CBC also done on this date, white count 13,000, H&H 14.7 and 42.8  respectively, platelet count 53,000.  Latest chest x-ray results have  already been discussed previously.  Another chest x-ray will be obtained  in the morning prior to discharge.   DISCHARGE MEDICATIONS:  Include the following,  1. Theophylline CR 100 mg one-half p.o. 2 times daily.  2. Advair Diskus 250/50 mcg 1 puff inhale 2 times daily.  3. DuoNeb 2.5 to 5 mg per 3 mL inhale twice daily.  4. Singulair 10 mg p.o. twice daily.  5. Albuterol 90 mcg/ACT 2 puffs q.4 h. p.r.n.  6. Spiriva 18 mcg inhale daily.  7. Fludrocortisone propionate 50 mcg per ACT 1-2 puffs each nostril      daily.  8. Prednisone 5 mg p.o. daily.  9. Xolair, dosage is taken preoperatively IM every 2 weeks.  10.B10 as directed.  11.Mucinex 600 mg p.o. 2 times daily as needed.  12.Wellbutrin 150 mg p.o. 2 times daily.  13.Synthroid 75 mcg p.o. daily.  14.Actonel 35 mg p.o. weekly.  15.Premarin 0.625 mg p.o. daily.  16.Lopressor 25 mg p.o. 2 times daily.  17.Vicodin 5/500, 1-2 tablets p.o. q.4-6 h. p.r.n. pain.      [name], [place]      Ines Bloomer, M.D.  Electronically Signed    DZ/MEDQ  D:  12/28/2008  T:  12/29/2008  Job:  [id number]   cc:   [name] D. Joan Hudson, MD, FCCP, FACP

## 2011-01-31 NOTE — Letter (Signed)
January 05, 2009   Clinton D. Maple Hudson, MD, FCCP, FACP  [facility]-Pulmonary Dept  [address]  [place], [place] [address]   Re:  TOI, STELLY                DOB:  06/26/1936   Dear Joni Fears,   I saw the patient back in the office today.  We attempted to do a needle  biopsy on her and she has had a pneumothorax requiring admission and the  needle biopsy just showed atypical granular cells.  She comes in today  and her subcutaneous emphysema is decreasing and her pneumothorax has  pretty much resolved.  I am not sure obviously if this is a finding that  does significantly answer our problems, but I think the best thing to do  is just to follow this and see if this gets any bigger, so I will plan  to see her again in 2 months with a chest x-ray.  I discussed this in  detail with she and her husband and they agree with this plan.   Ines Bloomer, M.D.  Electronically Signed   DPB/MEDQ  D:  01/05/2009  T:  01/06/2009  Job:  [id number]

## 2011-01-31 NOTE — H&P (Signed)
NAMEJOSALYNN, [name]                ACCOUNT NO.:  [id number]  MEDICAL RECORD NO.:  [id number]           PATIENT TYPE:  I  LOCATION:  2928                         FACILITY:  [facility]  PHYSICIAN:  Corky Crafts, MDDATE OF BIRTH:  [birth year]  DATE OF ADMISSION:  01/16/2011 DATE OF DISCHARGE:                             HISTORY & PHYSICAL   PRIMARY CARE PHYSICIAN:  Georgann Housekeeper, MD  REASON FOR ADMISSION:  Atrial flutter.  HISTORY OF PRESENT ILLNESS:  The patient is a 75 year old woman with severe lung disease.  She has had episodes of SVT in the past where she was treated with adenosine and subsequently IV diltiazem.  She is converted back to normal sinus rhythm on several occasions.  She had been placed on increasing doses of Cardizem as an outpatient.  The dose of Cardizem was 360 but because of increased swelling it was decreased to 300 mg daily.  In February 2012, she had an ejection fraction showing normal LV function.  Over the past 2 days, she has had more irregular heart rate that she has noticed at home.  She has also noticed an increasing shortness of breath.  She has tried nebulizer treatments without improvement.  She had to sleep on three pillows,  last night she has noticed that her ankles are more swollen.  She is not on any chest pain, syncope, nausea, vomiting, diarrhea or constipation.  She just feels weak and dizzy.  In our office ECG showed heart rate in the 150 range.  She received several doses of IV diltiazem which slowed down her heart rate and clearly showed atrial flutter waves.  She is admitted for further evaluation since she did not convert.  MEDICATIONS AT HOME:  Calcium, Nasonex, Singulair 10 mg, Xolair, tramadol 50 mg p.r.n. pain, Fortical, Flexeril p.r.n., Fosamax once a week, Prevacid 15 mg daily, Xopenex, Advair, theophylline, Premarin, Naprosyn, CPAP, Spiriva, Ventolin, aerosol, oxygen, prednisone 5 mg daily, Synthroid, diltiazem 300 mg  daily, Bupropion 150 mg.  PAST MEDICAL HISTORY: 1. COPD on chronic oxygen and prednisone. 2. Hypothyroidism. 3. Allergic rhinitis. 4. GERD. 5. Sleep apnea. 6. Anxiety. 7. Depression. 8. Diverticulosis. 9. Prior pneumonia. 10.Hyperlipidemia.  SURGICAL HISTORY:  Hysterectomy, oophorectomy, appendectomy.  FAMILY HISTORY:  Father died from suicide at age 7.  Mother had coronary artery disease and died at age 28.  SOCIAL HISTORY:  She quit smoking 15 years ago.  She is married.  ALLERGIES:  AVELOX she gets a rash.  REVIEW OF SYSTEMS:  No nausea or vomiting.  No bleeding problems.  She has had some cough few weeks ago.  No syncope as above.  All other systems negative.  PHYSICAL EXAMINATION:  VITAL SIGNS:  Blood pressure 133/71, heart rate ranging from 110-130. GENERAL:  She is awake, alert, no apparent distress. HEAD:  Normocephalic, atraumatic. EYES:  Extraocular movements intact. NECK:  No JVD. CARDIOVASCULAR:  Tachycardic, irregularly irregular rhythm.  S1-S2. LUNGS:  Clear to auscultation bilaterally. ABDOMEN:  Soft, nontender. EXTREMITIES:  No edema.  ASSESSMENT/PLAN: 1. Atrial flutter.  Plan intravenous Cardizem.  We will also start on     intravenous heparin.  For stroke prevention, start Coumadin as     well.  We will get Electrophysiology consult.  Since she has had     several recurrences of supraventricular tachycardia, I think     ablation would be reasonable to consider despite her multiple     medical problems.  Given her severe lung disease, I think this will     be a recurrent problem. 2. Hypothyroidism.  Continue Synthroid.  She will need her TSH checked     as well. 3. Shortness of breath likely related to diastolic dysfunction.  She     had an echo showing normal left ventricular systolic function 2     months ago.  We will give Lasix as needed. 4. Chronic obstructive pulmonary disease.  Continue oxygen and     prednisone as previously  prescribed.     Corky Crafts, MD     JSV/MEDQ  D:  01/16/2011  T:  01/17/2011  Job:  [id number]  Electronically Signed by Lance Muss MD on 01/31/2011 08:10:32 AM

## 2011-01-31 NOTE — Assessment & Plan Note (Signed)
[facility]                             PULMONARY OFFICE NOTE   NAME:[name], [name]                       MRN:          [id number]  DATE:06/06/2007                            DOB:          [birth year]    HISTORY OF PRESENT ILLNESS:  The patient is a 75 year old white female  patient of Dr. Roxy Cedar with a known history of allergic rhinitis,  chronic obstructive pulmonary disease with an asthmatic bronchitic  component, and obstructive sleep apnea on nocturnal CPAP who presents  today for an acute office visit.  The patient complains over the last  week she has had increased shortness of breath with activity and  wheezing.  The patient also complains that she has had some upper back  and shoulder pain that she describes as an aching sensation.  The  patient complains that she has also felt some discomfort in the  epigastric and mid chest region.  Has had some heartburn and indigestion  symptoms as well.  She denies any palpitations, presyncopal or syncopal  episodes, exertional chest pain, radiating chest pain, diaphoresis.  The  patient does have hot flashes, but reports this has been chronic.  She  denies any hemoptysis, cough, purulent sputum, leg swelling or calf  pain.  The patient does have chronic obstructive pulmonary disease with  an FEV1 of 0.75 L which is 36% of the predicted.  She denies any family  history of coronary artery disease.   PAST MEDICAL HISTORY:  Reviewed.   CURRENT MEDICATIONS:  Reviewed.   PHYSICAL EXAMINATION:  The patient is a pleasant female in no acute  distress.  She is afebrile with stable vital signs.  O2 saturation is 95% on room  air.  HEENT:  Nasal mucosa is pale.  Nontender sinuses, posterior pharynx is  clear.  Conjunctivae not injected.  TMs are normal.  NECK:  Supple without cervical adenopathy.  No JVD.  Carotids are equal  with positive upstrokes bilaterally without any bruits.  Lung sounds reveal  diminished breath sounds with a faint expiratory  wheeze.  CARDIAC:  Regular rate and rhythm without murmurs, rubs, or gallops.  ABDOMEN:  Soft, nontender, no palpable hepatosplenomegaly.  No guarding  or rebound noted.  No masses or bruits are appreciated.  EXTREMITIES:  Warm without any calf tenderness, cyanosis, clubbing, or  edema.  Negative Homan's sign.  The patient does have tenderness along the mid posterior thoracic area  and subscapular regions.  No ecchymosis or deformity is noted.  The  patient also has tenderness along the anterior chest wall without any  obvious deformity or ecchymosis noted as well.   DATA:  EKG reveals a normal sinus rhythm with some nonspecific ST  changes.   IMPRESSION AND PLAN:  1. Mild asthmatic bronchitic flare.  The patient is to begin Mucinex      twice daily.  Add in Zyrtec 10 mg at bedtime.  Depo-Medrol 80 mg      was given today in the office, and a Xopenex nebulizer treatment      was given.  The patient is  to return back with Dr. Maple Hudson in 2 weeks      or sooner if needed.  The patient is advised if symptoms do not      improve or worsen, she is to contact our office for sooner followup      or seek emergency room attention.  2. Gastroesophageal reflux symptoms.  The patient is to begin Protonix      40 mg daily.  Samples were given.  The patient is advised on reflux      preventative measures, and the patient will return back in 2 weeks      for a followup.  3. Back pain and atypical chest pain.  Questionable musculoskeletal in      nature.  The patient's EKG has nonspecific changes.  The patient      does need to follow up with her primary care physician, Dr. Donette Larry,      for further evaluation and have sent a copy of her EKG with patient      for review of her previous EKGs.  The patient will continue to      follow up with Dr. Donette Larry as recommended.  Chest x-ray is pending      at time of dictation, will follow up accordingly.       Rubye Oaks, NP  Electronically Signed      Clinton D. Maple Hudson, MD, Tonny Bollman, FACP  Electronically Signed   TP/MedQ  DD: 06/07/2007  DT: 06/07/2007  Job #: [id number]

## 2011-01-31 NOTE — Assessment & Plan Note (Signed)
[facility]                             PULMONARY OFFICE NOTE   NAME:[name], [name]                       MRN:          [id number]  DATE:05/02/2007                            DOB:          March 13, 1936    PROBLEMS:  1. Allergic rhinitis.  2. Chronic obstructive pulmonary disease/asthma.  3. Chronic nasal obstruction.  4. Obstructive sleep apnea.   HISTORY:  She had needed to work in again August 8 with an acute  tracheobronchitis episode treated with Augmentin and Mucinex.  She  returns now for scheduled followup 6 days later feeling less tight and  sore diffusely through the chest.  Still coughing with little phlegm.  This had begun with a sore-throat-like cold, and husband has had a cough  recently.  She continues CPAP at 8 CWP.   MEDICATIONS:  1. Theophylline 100 mg b.i.d.  2. Budeprion SR 150 mg.  3. Synthroid 75 mcg.  4. Premarin 0.625 mg.  5. Advair 250/50.  6. Nasonex.  7. Actonel 35 per week.  8. Home nebulizer with DuoNeb.  9. Singulair 10 mg.  10.Aricept 10 mg.  11.Albuterol inhaler.  12.She is still finishing her Augmentin.   GI intolerant to Biaxin.  Otherwise, no medication intolerance.   OBJECTIVE:  Weight 165 pounds, BP 148/72, pulse 94, room air saturation  90%.  No wheeze, but she generates a wheezy cough after a deep breath.  I do  not find focal changes or dullness.  Heart sounds are regular without murmur.  There is no adenopathy or edema.   IMPRESSION:  Tracheobronchitis resolving slowly.   PLAN:  Nebulizer treatment here.  Xopenex 1.25 mg.  Depo-Medrol 80 mg  IM.  Steroid talk.  Fluids.  Schedule return in 4 months, but earlier  p.r.n. failure to clear.     Clinton D. Maple Hudson, MD, Tonny Bollman, FACP  Electronically Signed    CDY/MedQ  DD: 05/02/2007  DT: 05/03/2007  Job #: [id number]   cc:   Georgann Housekeeper, MD

## 2011-01-31 NOTE — Letter (Signed)
December 07, 2008   Clinton D. Maple Hudson, MD, FCCP, FACP  [facility]-Pulmonary Dept  [address]  [place]  [place] [address]   Re:  [name], [name]                DOB:  [birth year]   Dear Joni Fears,   I appreciate the opportunity of seeing the patient.  This 75 year old  Asian female has a long history of smoking and chronic obstructive  pulmonary disease and is presently oxygen dependent.  She was found to  have a left upper lobe lesion and a PET scan was positive in this area  and she referred here for evaluation.  The SUV uptake in the left upper  lobe lesion was 24, which was quite high for a small lesion.  On CT  scan, there is a couple of bullae next to the lesion.  She is on 2-3 L  of oxygen.   MEDICATIONS:  Theophylline 100 mg daily, Advair Diskus 250-50 daily,  Theo-Dur nebs 2.5-5, Singulair, albuterol, Spiriva inhaler 18 mcg daily,  fluticasone propriate  1 puff daily, prednisone 5 mg daily, allergy  vaccine, CPAP, Xolair 150 mg every 2 weeks, Mucinex 600 mg twice a day,  bupropion 150 mg daily, Synthroid 75 mcg daily, Premarin 0.65 daily, and  Actonel 35 mg weekly.   ALLERGIES:  She is allergic to Biaxin.  Her pulmonary function test  showed and FVC of 0.92 with an FEV-1 of 3.21 diffusion capacity of 38%,  and she was able to do 6-minute walk test.   PAST MEDICAL HISTORY:  She has obstructive sleep apnea, rhinosinusitis,  chronic obstructive pulmonary disease, asthma, allergic rhinitis.  She  has had a previous hysterectomy, tonsillectomy, bladder pexy  and  appendectomy.   FAMILY HISTORY:  Positive for cardiac disease.   SOCIAL HISTORY:  She is a former smoker quit in 1988.  Retired, married  with 2 children.   REVIEW OF SYSTEMS:  Weight has been stable.  CARDIAC:  She gets no  angina or atrial fibrillation.  PULMONARY:  Needs oxygen, shortness of  breath with exertion, bronchitis, wheezing.  No hemoptysis.  GI:  No  GERD.  GU:  No kidney disease,  dysuria, or frequent urination.  VASCULAR:  No claudication, DVT, or TIA's.  NEUROLOGIC:  No dizziness  headaches, blackouts, or seizures.  MUSCULOSKELETAL:  Arthritis.  PSYCHIATRIC:  No depression or nervousness.  EYES/ENT:  No change in her  eyesight or hearing.  HEMATOLOGIC:  No problems with bleeding, clotting  disorders, or anemia.   PHYSICAL EXAMINATION:  GENERAL:  She is an ill-appearing Caucasian  female in no acute distress.  HEAD, EYES, EARS, NOSE, AND THROAT:  Unremarkable.  NECK:  Supple without thyromegaly.  There is no supraclavicular axillary  adenopathy.  CHEST:  Decreased breath with bilateral wheezes.  HEART:  Regular sinus rhythm.  No murmurs.  ABDOMEN:  Obese.  Bowel sounds are normal.  EXTREMITIES:  Pulses 2+.  There is no clubbing or edema.  NEUROLOGICAL:  She is oriented x3.  Sensory and motor intact.  Cranial  nerves are intact.    I discussed the situation with her, she is at high risk of having a  pneumothorax with a needle biopsy.  I think that is the best option.  I  do not think her pulmonary function tests particularly with her DLCO  being low would make her amenable to wedge resection and seed  implantation.  We have no other  option, but I still would not do this,  even consider surgery without a definitive diagnosis.  She could  possibly have electromagnetic navigation bronchoscopy, but again this is  so far and I am not sure that could be reached.  My best thought is this  patient to have the needle biopsy and I will discuss this with Radiology  and plan to admit her for 24 hours after the biopsy.   I appreciate the opportunity of seeing the patient.   Sincerely,   Ines Bloomer, M.D.  Electronically Signed   DPB/MEDQ  D:  12/07/2008  T:  12/08/2008  Job:  [id number]

## 2011-01-31 NOTE — Assessment & Plan Note (Signed)
[facility]                             PULMONARY OFFICE NOTE   NAME:[name], [name]                       MRN:          [id number]  DATE:04/26/2007                            DOB:          03-07-1936    HISTORY OF PRESENT ILLNESS:  Patient is a 75 year old white female  patient of Dr. Roxy Cedar who has a known history of COPD with an asthma  bronchitic component, allergic rhinitis, and obstructive sleep apnea  presents today with a 1 week history of nasal congestion, productive  cough with thick yellow sputum, and shortness of breath with activity.  Patient denies any hemoptysis, orthopnea, PND, or leg swelling.   PAST MEDICAL HISTORY:  Reviewed.   CURRENT MEDICATIONS:  Reviewed.   PHYSICAL EXAMINATION:  Patient is a pleasant female in no acute  distress. She is afebrile with stable vital signs.  HEENT: Nasal mucosa with some mild erythema. Nontender sinuses.  Posterior pharynx clear.  NECK: Clear without cervical adenopathy. No JVD.  LUNG SOUNDS: Reveal diminished breath sounds without any wheezing or  crackles.  CARDIAC: Regular rate.  ABDOMEN: Soft and nontender.  EXTREMITIES: Warm without any edema.   IMPRESSION AND PLAN:  Acute tracheobronchitis. Patient is to begin  Augmentin times 7 days. Mucinex DM twice daily. Saline nasal spray as  needed. Patient is to return back with Dr. Maple Hudson as scheduled or sooner  if needed.      Joan Oaks, NP  Electronically Signed      Clinton D. Maple Hudson, MD, Tonny Bollman, FACP  Electronically Signed   TP/MedQ  DD: 04/26/2007  DT: 04/26/2007  Job #: [id number]

## 2011-02-03 NOTE — Assessment & Plan Note (Signed)
[facility]                               PULMONARY OFFICE NOTE   NAME:STUTTSSymiah, [name]                       MRN:          [id number]  DATE:06/27/2006                            DOB:          05-17-1936    PULMONARY FOLLOWUP   PROBLEM:  1. Allergic rhinitis.  2. Asthmatic bronchitis.  3. Chronic nasal obstruction.  4. Obstructive sleep apnea.   HISTORY:  She was seen in late August by the nurse practitioner with an  acute exacerbation of asthmatic bronchitis treated with Omnicef and Mucinex  and with a prednisone taper.  She returns now saying that she has only  minimal residual from that but in the past week has caught a distinct cold  with increased rhinorrhea, cough, white sputum, but no sore throat.  No  muscle aches.  She tired Mucinex.  She needed to use her home nebulizer 3  times yesterday.  She had a good vacation in [place] without significant  breathing problems.  She continues allergy vaccine at 1 to 10 getting her  injections here without problems.  She continues CPAP at 8 CWP through  [employer] and is doing well with that.  She does not seek changes.   MEDICATION:  1. Theophylline 300 mg b.i.d.  2. Budeprion SR 150 mg.  3. Synthroid 75 mg.  4. Premarin 0.625.  5. Advair 250/50.  6. Nasonex.  7. Actonel 35 mg weekly.  8. Home nebulizer with DuoNeb.  9. Singulair 10 mg.  10.Aricept 10 mg.  11.CPAP at 8 CWP.  12.Rescue albuterol inhaler.   OBJECTIVE:  Weight 166 pounds.  Temperature 97.  BP 112/72.  Pulse regular  82.  Room air saturation 92%.  She has a wheezy congestion bilaterally, not really labored and with no  dullness.  Her chronic nasal speech quality is unchanged.  She does have a  nonproductive cough today.  Her heart sounds are regular without murmur.  I find no adenopathy.   Theophylline level was 18.5 on 300 mg b.i.d. on Feb 06, 2006.   IMPRESSION:  1. Obstructive sleep apnea, well controlled.  2.  Acute rhinitis and bronchitis/exacerbation of chronic obstructive      pulmonary disease.  3. Theophylline level is too high.   PLAN:  1. Depo-Medrol 80 mg IM.  2. Prednisone 8-day taper from 40 mg to hold with discussion.  3. Nebulizer treatment today with Xopenex 1.25 mg.  4. Augmentin 875 mg b.i.d. x7 days.  5. Reduce theophylline to 1/2 times 300 mg b.i.d.  6. Schedule a return in 1 month, earlier p.r.n.       Clinton D. Maple Hudson, MD, [facility], FACP      CDY/MedQ  DD:  07/01/2006  DT:  07/02/2006  Job #:  [id number]   cc:   Georgann Housekeeper, MD

## 2011-02-03 NOTE — Assessment & Plan Note (Signed)
[facility]                             PULMONARY OFFICE NOTE   NAME:STUTTSNatha, Guin                       MRN:          [id number]  DATE:11/05/2006                            DOB:          12/03/1935    HISTORY OF PRESENT ILLNESS:  The patient is a 75 year old white female  patient of Dr. Roxy Cedar who has a known history of COPD, asthmatic  bronchitis and allergic rhinitis, presents for a one week history of  productive cough with green sputum, nasal congestion and drainage.  The  patient denies any hemoptysis, orthopnea, PND or leg swelling.   PAST MEDICAL HISTORY:  Reviewed.   CURRENT MEDICATIONS:  Reviewed.   PHYSICAL EXAMINATION:  GENERAL:  The patient is a pleasant female in no  acute distress.  VITAL SIGNS:  She is afebrile with stable vital signs.  Saturation is  91% on room air.  HEENT:  Nasal mucosa is pale and swollen.  Posterior pharynx is clear.  NECK:  Supple without adenopathy.  LUNGS:  Coarse breath sounds without any wheezing.  CARDIAC:  Regular rate.  ABDOMEN:  Nontender.  EXTREMITIES:  Warm without any edema.   IMPRESSION/PLAN:  Asthmatic bronchitis.  Plan:  The patient is to begin  Omnicef x7 days, Mucinex DM b.i.d., Claritin daily x1 week.  The patient  is to return back with Dr. Maple Hudson as scheduled.      Rubye Oaks, NP  Electronically Signed      Clinton D. Maple Hudson, MD, [facility], FACP  Electronically Signed   TP/MedQ  DD: 11/05/2006  DT: 11/05/2006  Job #: [id number]

## 2011-02-03 NOTE — Assessment & Plan Note (Signed)
[name]                             PULMONARY OFFICE NOTE   NAME:[name], [name]                       MRN:          [id number]  DATE:08/24/2006                            DOB:          September 20, 1935    PROBLEMS:  1. Allergic rhinitis.  2. Chronic obstructive pulmonary disease/asthma.  3. Chronic nasal obstruction.  4. Obstructive sleep apnea.   HISTORY:  She was seen in November, and returns now to follow up PFTs.  She admits feeling somewhat better since we reduced her theophylline  dose from 300 to 150 mg b.i.d.  I have suggested she stop it completely  for comparison,and restart at the 150 mg dose if she feels she needs it.  There have been no acute events.  She did get flu shot.  She describes  shortness of breath and some vague tightness through the chest if she is  walking briskly.  I discussed symptoms that possibly could suggest  angina for her to watch.   MEDICATIONS:  1. Theophylline 150 mg b.i.d.  2. Budeprion SR 150 mg.  3. Synthroid 75 mcg.  4. Premarin 0.625.  5. Advair 250/50.  6. Nasonex.  7. Actonel 35 mg per week.  8. Home nebulizer with DuoNeb.  9. Singulair 10 mg.  10.Aricept 10 mg.  11.CPAP 8 CWP through [facility].  12.Mucinex.   ALLERGIES:  DRUG INTOLERANCE TO AVELOX, she thinks caused hives,  DOXYCYCLINE that may have also caused a rash, and BIAXIN that she thinks  caused stomach upset.   OBJECTIVE:  Weight 165 pounds, BP 136/78, pulse regular 99, room air  saturation 94%.  Her chronic nasal speech quality is unchanged, with no stridor and no  visible drainage.  Breath sounds are quiet and diminished, but without wheeze, rhonchi or  cough.  Work of breathing is not increased at rest.  Heart sounds are regular without murmur.  There is no edema or cyanosis.   PFT:  Severe obstructive airways disease with an FEV1 after dilator of  only 0.81 (39% predicted).  Overall no significant response to  dilator.  Lung volumes showed hyperinflation consistent with air trapping.  Diffusion was severely reduced at 39% of predicted for an overall  impression of severe COPD, mainly emphysema.  On a 6-minute walk test  she went 375 meters and began to look somewhat labored with her  breathing at 3 minutes.  She had some complaint of chest tightness which  was not very specific.  Her oxygen saturation during this exercise  dropped from 94% at baseline to 87%, and rebounded within 2 minutes to  94% on room air.   IMPRESSION:  1. Severe chronic obstructive pulmonary disease, emphysema pattern      with no advantage to pushing additional medication.  We would like      to let her try off of theophylline for comparison.  2. Allergic rhinitis, is controlled with no change or problems at this      time of year.  3. I talked with her about the chest tightness and asked her to let  Dr. Donette Larry know if this changed or seemed to worsen so that we can      make sure she is not describing exertional angina, although right      now I think it is respiratory.  4. I did give her contact information for the pulmonary rehabilitation      program.  5. Schedule return in 4 months, earlier p.r.n.     Clinton D. Maple Hudson, MD, [facility], FACP  Electronically Signed    CDY/MedQ  DD: 08/25/2006  DT: 08/25/2006  Job #: [id number]   cc:   Georgann Housekeeper, MD

## 2011-02-03 NOTE — Assessment & Plan Note (Signed)
[facility]                             PULMONARY OFFICE NOTE   NAME:[name], Seppala                       MRN:          [id number]  DATE:12/08/2006                            DOB:          April 03, 1936    PULMONARY OFFICE FOLLOWUP   PROBLEMS:  1. Allergic rhinitis.  2. Chronic obstructive pulmonary disease/asthma.  3. Chronic nasal obstruction.  4. Obstructive sleep apnea.   HISTORY:  She had seen the nurse practitioner February 18 and was  treated with [facility] and Mucinex.  Still feels tight through the chest  with a little substernal soreness when she coughs.  The cough was  productive until 2 days ago and had been yellow.  No fever.  Some head  congestion without much sneeze.  It began when she awoke with sore  throat that sounds more as if she was mouth-breathing with nasal  congestion.   MEDICATIONS:  1. Theophylline 100 mg b.i.d.  2. Budeprion SR 150 mg.  3. Synthroid 75 mcg.  4. Premarin 0.625 mg.  5. Advair 250/50.  6. Nasonex.  7. Actonel 35 mg.  8. DuoNeb by home nebulizer.  9. Singulair 10 mg.  10.Aricept 10 mg.  11.Home CPAP at 8 CWP through [facility].  12.Rescue use of albuterol inhaler.   DRUG INTOLERANCES:  BIAXIN.   OBJECTIVE:  Weight 167 pounds, BP 136/74, pulse regular 103, room air  saturation 93%.  Wet nose.  Mild nasal congestion.  Oropharynx is clear.  No adenopathy.  Wheezing cough.  Heart sounds are normal.   IMPRESSION:  Exacerbation of rhinitis, possible sinusitis and asthmatic  bronchitis.   PLAN:  Biaxin 500 mg b.i.d. for 7 days is re-tried, because previous  history of intolerance was not confirmed.  Prednisone 8-day taper 40 mg.  She will keep appointment in 4 months, earlier p.r.n.     Clinton D. Maple Hudson, MD, [facility], FACP  Electronically Signed    CDY/MedQ  DD: 12/08/2006  DT: 12/08/2006  Job #: [id number]   cc:   Georgann Housekeeper, MD

## 2011-02-03 NOTE — Assessment & Plan Note (Signed)
[facility]                               PULMONARY OFFICE NOTE   NAME:STUTTSCamesha, [name]                       MRN:          [id number]  DATE:07/27/2006                            DOB:          1935/11/21    PULMONARY/ALLERGY FOLLOWUP   PROBLEMS:  1. Allergic rhinitis.  2. Asthmatic bronchitis.  3. Chronic nasal obstruction.  4. Obstructive sleep apnea.   HISTORY:  She has had persistent chest congestion and some shortness of  breath, nasal stuffiness, and watery rhinorrhea going back probably at least  a month and maybe more.  There has been nothing purulent, no fever, no sore  throat.  When she was here in mid October we had given a prednisone taper  and she did take that.  Her Theophylline level was then elevated and she has  reduced her dose to 150 mg b.i.d.  Since then she has been wheezing a bit  more.  She notes wheezing mostly when she is up and active.  She sleeps and  breathes better while she is wearing her CPAP, which she never goes without.  She is using either her nebulizer or her albuterol more regularly, now about  twice a day, and we discussed this in the context of her pulmonary status.  She had pulmonary function tests supporting a diagnosis of COPD, but has not  been tested recently.   MEDICATIONS:  1. Theophylline now 150 mg b.i.d.  2. Buspirone SR 150 mg.  3. Synthroid 75 mcg.  4. Premarin 0.625 mg.  5. Advair 250/50.  6. Nasonex.  7. Acetenyl 35 mg per week.  8. DuoNeb by nebulizer.  9. Singulair 10 mg.  10.Aricept 10 mg.  11.CPAP at 8 CWP through [facility].  12.Albuterol inhaler or nebulizer.  13.Mucinex.   DRUG INTOLERANCES:  BIAXIN with GI upset.  Either DOXYCYCLINE or AVALOX  seemed to cause hives.  She continues allergy vaccine here at 1:10.   OBJECTIVE:  Weight 164 pounds, BP 130/72, pulse regular 90, room air  saturation 90%.  She has a chronic hyper-nasal speech sound, but her nose does not look  obstructed and there is no obvious drainage.  CHEST:  Quiet, but she has a somewhat wheezy-sounding cough.  HEART:  Sounds are regular without murmur or gallop.  There is no peripheral  edema, adenopathy, or clubbing.   IMPRESSION:  Dyspnea with uncertain status of her chronic obstructive  pulmonary disease.  She seems to have had a persistent asthmatic bronchitis  through the mid summer to present.  Note that her oxygen saturation is a  little lower than it had been running.   PLAN:  1. We are scheduling pulmonary function tests.  Chest x-ray was reviewed      from the spring when it had shown COPD with no active pulmonary      disease.  Blood is drawn for CBC with diff.  Basic chemistry,      Theophylline level on 150 mg b.i.d., and D-dimer.  2. Prednisone 10 mg to take 2 daily for 2 weeks, then stop.  3. Schedule return in 1 month, earlier p.r.n.     Clinton D. Maple Hudson, MD, [facility], FACP  Electronically Signed    CDY/MedQ  DD: 07/29/2006  DT: 07/29/2006  Job #: [id number]   cc:   Georgann Housekeeper, MD

## 2011-02-03 NOTE — Assessment & Plan Note (Signed)
[facility]                               PULMONARY OFFICE NOTE   NAME:[name], [name]                       MRN:          [id number]  DATE:05/14/2006                            DOB:          1936-04-09    HISTORY OF PRESENT ILLNESS:  The patient is a 75 year old white female  patient of Dr. Maple Hudson with know history of allergic rhinitis, asthmatic  bronchitis and obstructive sleep apnea.  Patient is maintained on vaccine at  1:10 weekly.  Patient presents for an acute office visit complaining of a  five day history of cough, congestion and wheezing.  She denies any  hemoptysis, chest pain, orthopnea, PND, recent travel or antibiotic use.  The patient reports she is leaving on vacation to go to [place] at the end of  the week.  The patient is maintained on theophylline 300 mg b.i.d., Advair  250/50 b.i.d., Nasonex daily, Singulair daily and a DuoNeb's nebulizer  q.i.d. along with nocturnal CPAP.   PHYSICAL EXAMINATION:  GENERAL APPEARANCE:  The patient is a pleasant female  in no acute distress.  VITAL SIGNS:  Afebrile with stable vital signs.  HEENT:  Unremarkable.  NECK:  Supple without cervical adenopathy.  No JVD.  LUNGS:  Coarse breath sounds bilaterally with few expiratory wheezes.  CARDIAC:  Regular rate and rhythm.  ABDOMEN:  Soft, benign.  EXTREMITIES:  Warm without any edema.   IMPRESSION/PLAN:  Acute exacerbation of asthmatic bronchitis.  The patient  is to being Omnicef x7 days, Mucinex DM b.i.d. and prednisone taper over the  next week.  She is to return as scheduled with Dr. Maple Hudson, or sooner, if  needed.                                   Rubye Oaks, NP                                Clinton D. Maple Hudson, MD, [facility], FACP   TP/MedQ  DD:  05/15/2006  DT:  05/16/2006  Job #:  [id number]

## 2011-02-03 NOTE — Procedures (Signed)
[place]  Patient:    [name], [name] Visit Number: [id number] MRN: [id number]          Service Type: END Location: ENDO Attending Physician:  Dennison Bulla Ii Dictated by:   Verlin Grills, M.D. Proc. Date: 11/12/01 Admit Date:  11/12/2001 Discharge Date: 11/12/2001   CC:         Tyson Dense, M.D.   Procedure Report  DATE OF BIRTH:  1935-10-20  REFERRING PHYSICIAN:  Tyson Dense, M.D.  PROCEDURE PERFORMED:  Flexible proctosigmoidoscopy.  ENDOSCOPIST:  Verlin Grills, M.D.  INDICATIONS FOR PROCEDURE:  The patient is a 75 year old female.  She is referred for her first screening colonoscopy with polypectomy to prevent colon cancer.  She does report intermittent painless hematochezia.  There is no personal or family history of colon cancer.  I discussed with the patient the complications associated with colonoscopy and polypectomy including a 15 per 1000 risk of bleeding and 4 per 1000 risk of colon perforation requiring surgical repair.  The patient has signed the operative permit.  MEDICAL ALLERGIES:  None.  CHRONIC MEDICATIONS: 1. Advair inhaler. 2. Premarin. 3. Theophyllin. 4. Synthroid. 5. Nasonex nasal spray. 6. Bipap machine. 7. Calcium and Vitamin D.  PAST MEDICAL HISTORY:  Chronic obstructive pulmonary disease, hypothyroidism, chronic rhinitis, sleep apnea syndrome, total abdominal hysterectomy with unilateral oophorectomy, appendectomy, tonsillectomy.  PREMEDICATION:  Versed 7.5 mg, Demerol 50 mg.  ENDOSCOPE:  Olympus pediatric video colonoscope.  DESCRIPTION OF PROCEDURE:  After obtaining informed consent, the patient was placed in the left lateral decubitus position.  I administered intravenous Demerol and intravenous Versed to achieve conscious sedation for the procedure.  The patients blood pressure, oxygen saturation and cardiac rhythm were monitored throughout the procedure  and documented in the medical record.  Anal inspection was normal.  Digital rectal exam was normal.  The Olympus pediatric video colonoscope was then introduced into the rectum and advanced to approximately 35 cm from the anal verge.  Despite external abdominal pressure and repositioning the patient from the left lateral decubitus position to the supine position and finally to the right lateral decubitus position, I was unable to advance the endoscope more proximally.  Colonic preparation for the exam today was excellent.  Endoscopic appearance of the rectum and sigmoid colon with the endoscope advanced to 35 cm from the anal verge revealed a few colonic diverticula but no other abnormality.  There was no endoscopic evidence for the presence of colorectal neoplasia.  PLAN:  I am referring Ms. Kratz to the radiology suite for an air contrast barium enema. Dictated by:   Verlin Grills, M.D. Attending Physician:  Dennison Bulla Ii DD:  11/12/01 TD:  11/12/01 Job: [id number]

## 2011-02-17 ENCOUNTER — Ambulatory Visit (INDEPENDENT_AMBULATORY_CARE_PROVIDER_SITE_OTHER): Payer: Medicare Other

## 2011-02-17 DIAGNOSIS — J45909 Unspecified asthma, uncomplicated: Secondary | ICD-10-CM

## 2011-02-19 MED ORDER — OMALIZUMAB 150 MG ~~LOC~~ SOLR
225.0000 mg | Freq: Once | SUBCUTANEOUS | Status: AC
  Administered 2011-02-19: 225 mg via SUBCUTANEOUS

## 2011-02-20 ENCOUNTER — Encounter: Payer: Self-pay | Admitting: Internal Medicine

## 2011-03-09 ENCOUNTER — Ambulatory Visit (INDEPENDENT_AMBULATORY_CARE_PROVIDER_SITE_OTHER): Payer: Medicare Other

## 2011-03-09 DIAGNOSIS — J45909 Unspecified asthma, uncomplicated: Secondary | ICD-10-CM

## 2011-03-09 DIAGNOSIS — J309 Allergic rhinitis, unspecified: Secondary | ICD-10-CM

## 2011-03-10 MED ORDER — OMALIZUMAB 150 MG ~~LOC~~ SOLR
225.0000 mg | Freq: Once | SUBCUTANEOUS | Status: AC
  Administered 2011-03-10: 225 mg via SUBCUTANEOUS

## 2011-03-15 ENCOUNTER — Emergency Department (HOSPITAL_COMMUNITY)
Admission: EM | Admit: 2011-03-15 | Discharge: 2011-03-15 | Disposition: A | Discharge status: Home / Self Care | Payer: Medicare Other | Attending: Emergency Medicine | Admitting: Emergency Medicine

## 2011-03-15 ENCOUNTER — Emergency Department (HOSPITAL_COMMUNITY): Payer: Medicare Other

## 2011-03-15 DIAGNOSIS — E876 Hypokalemia: Secondary | ICD-10-CM | POA: Insufficient documentation

## 2011-03-15 DIAGNOSIS — R000 Tachycardia, unspecified: Secondary | ICD-10-CM | POA: Insufficient documentation

## 2011-03-15 DIAGNOSIS — E785 Hyperlipidemia, unspecified: Secondary | ICD-10-CM | POA: Insufficient documentation

## 2011-03-15 DIAGNOSIS — J449 Chronic obstructive pulmonary disease, unspecified: Secondary | ICD-10-CM | POA: Insufficient documentation

## 2011-03-15 DIAGNOSIS — I4892 Unspecified atrial flutter: Secondary | ICD-10-CM | POA: Insufficient documentation

## 2011-03-15 DIAGNOSIS — R5383 Other fatigue: Secondary | ICD-10-CM | POA: Insufficient documentation

## 2011-03-15 DIAGNOSIS — J4489 Other specified chronic obstructive pulmonary disease: Secondary | ICD-10-CM | POA: Insufficient documentation

## 2011-03-15 DIAGNOSIS — R0602 Shortness of breath: Secondary | ICD-10-CM | POA: Insufficient documentation

## 2011-03-15 DIAGNOSIS — Z79899 Other long term (current) drug therapy: Secondary | ICD-10-CM | POA: Insufficient documentation

## 2011-03-15 DIAGNOSIS — Z7901 Long term (current) use of anticoagulants: Secondary | ICD-10-CM | POA: Insufficient documentation

## 2011-03-15 DIAGNOSIS — R5381 Other malaise: Secondary | ICD-10-CM | POA: Insufficient documentation

## 2011-03-15 DIAGNOSIS — Z9889 Other specified postprocedural states: Secondary | ICD-10-CM | POA: Insufficient documentation

## 2011-03-15 LAB — CBC
MCH: 30.2 pg (ref 26.0–34.0)
Platelets: 255 10*3/uL (ref 150–400)
RBC: 4.61 MIL/uL (ref 3.87–5.11)
RDW: 13.2 % (ref 11.5–15.5)

## 2011-03-15 LAB — POCT I-STAT, CHEM 8
BUN: 14 mg/dL (ref 6–23)
Chloride: 103 mEq/L (ref 96–112)
Creatinine, Ser: 0.9 mg/dL (ref 0.50–1.10)
Sodium: 140 mEq/L (ref 135–145)
TCO2: 26 mmol/L (ref 0–100)

## 2011-03-15 LAB — PROTIME-INR: Prothrombin Time: 28.6 seconds — ABNORMAL HIGH (ref 11.6–15.2)

## 2011-03-15 LAB — THEOPHYLLINE LEVEL: Theophylline Lvl: 4.6 ug/mL — ABNORMAL LOW (ref 10.0–20.0)

## 2011-03-15 LAB — CK TOTAL AND CKMB (NOT AT ARMC): Relative Index: 6.6 — ABNORMAL HIGH (ref 0.0–2.5)

## 2011-03-30 ENCOUNTER — Encounter: Payer: Self-pay | Admitting: Internal Medicine

## 2011-04-05 ENCOUNTER — Ambulatory Visit (INDEPENDENT_AMBULATORY_CARE_PROVIDER_SITE_OTHER): Payer: Medicare Other

## 2011-04-05 ENCOUNTER — Encounter: Payer: Medicare Other | Admitting: Internal Medicine

## 2011-04-05 DIAGNOSIS — J309 Allergic rhinitis, unspecified: Secondary | ICD-10-CM

## 2011-04-06 ENCOUNTER — Ambulatory Visit (INDEPENDENT_AMBULATORY_CARE_PROVIDER_SITE_OTHER): Payer: Medicare Other

## 2011-04-06 DIAGNOSIS — J45909 Unspecified asthma, uncomplicated: Secondary | ICD-10-CM

## 2011-04-07 MED ORDER — OMALIZUMAB 150 MG ~~LOC~~ SOLR
225.0000 mg | Freq: Once | SUBCUTANEOUS | Status: AC
  Administered 2011-04-07: 225 mg via SUBCUTANEOUS

## 2011-04-13 ENCOUNTER — Encounter: Payer: Self-pay | Admitting: Internal Medicine

## 2011-04-20 ENCOUNTER — Inpatient Hospital Stay (HOSPITAL_COMMUNITY)
Admission: RE | Admit: 2011-04-20 | Discharge: 2011-04-20 | Payer: Medicare Other | Source: Ambulatory Visit | Attending: Radiation Oncology | Admitting: Radiation Oncology

## 2011-04-27 ENCOUNTER — Ambulatory Visit
Admission: RE | Admit: 2011-04-27 | Discharge: 2011-04-27 | Disposition: A | Discharge status: Home / Self Care | Payer: Medicare Other | Source: Ambulatory Visit | Attending: Radiation Oncology | Admitting: Radiation Oncology

## 2011-04-28 ENCOUNTER — Ambulatory Visit (INDEPENDENT_AMBULATORY_CARE_PROVIDER_SITE_OTHER): Payer: Medicare Other

## 2011-04-28 ENCOUNTER — Telehealth: Payer: Self-pay | Admitting: Internal Medicine

## 2011-04-28 DIAGNOSIS — J45909 Unspecified asthma, uncomplicated: Secondary | ICD-10-CM

## 2011-04-28 NOTE — Telephone Encounter (Signed)
Katie have you seen any refill requests from [facility] for her neb meds.  [facility] told the pt that they sent over refill request and have not heard back from [place].  She stated that she will run out of her meds today.  They said that if we would fax these back today she can get her meds tomorrow.  Katie please advise. thanks

## 2011-05-01 MED ORDER — OMALIZUMAB 150 MG ~~LOC~~ SOLR
225.0000 mg | Freq: Once | SUBCUTANEOUS | Status: AC
  Administered 2011-05-01: 225 mg via SUBCUTANEOUS

## 2011-05-01 NOTE — Telephone Encounter (Signed)
[facility] faxed the order to my attn; signed and faxed back on Friday afternoon.

## 2011-05-02 ENCOUNTER — Encounter (HOSPITAL_COMMUNITY): Payer: Self-pay

## 2011-05-02 ENCOUNTER — Ambulatory Visit (HOSPITAL_COMMUNITY)
Admission: RE | Admit: 2011-05-02 | Discharge: 2011-05-02 | Disposition: A | Discharge status: Home / Self Care | Payer: Medicare Other | Source: Ambulatory Visit | Attending: Radiation Oncology | Admitting: Radiation Oncology

## 2011-05-02 DIAGNOSIS — C3410 Malignant neoplasm of upper lobe, unspecified bronchus or lung: Secondary | ICD-10-CM | POA: Insufficient documentation

## 2011-05-02 DIAGNOSIS — Z4789 Encounter for other orthopedic aftercare: Secondary | ICD-10-CM | POA: Insufficient documentation

## 2011-05-02 DIAGNOSIS — C3490 Malignant neoplasm of unspecified part of unspecified bronchus or lung: Secondary | ICD-10-CM

## 2011-05-02 DIAGNOSIS — I700 Atherosclerosis of aorta: Secondary | ICD-10-CM | POA: Insufficient documentation

## 2011-05-02 DIAGNOSIS — Z923 Personal history of irradiation: Secondary | ICD-10-CM | POA: Insufficient documentation

## 2011-05-02 DIAGNOSIS — R05 Cough: Secondary | ICD-10-CM | POA: Insufficient documentation

## 2011-05-02 DIAGNOSIS — R0602 Shortness of breath: Secondary | ICD-10-CM | POA: Insufficient documentation

## 2011-05-02 DIAGNOSIS — R059 Cough, unspecified: Secondary | ICD-10-CM | POA: Insufficient documentation

## 2011-05-02 HISTORY — DX: Malignant (primary) neoplasm, unspecified: C80.1

## 2011-05-03 ENCOUNTER — Other Ambulatory Visit (HOSPITAL_COMMUNITY): Payer: Medicare Other

## 2011-05-23 ENCOUNTER — Ambulatory Visit (INDEPENDENT_AMBULATORY_CARE_PROVIDER_SITE_OTHER): Payer: Medicare Other

## 2011-05-23 DIAGNOSIS — J309 Allergic rhinitis, unspecified: Secondary | ICD-10-CM

## 2011-05-23 DIAGNOSIS — J45909 Unspecified asthma, uncomplicated: Secondary | ICD-10-CM

## 2011-05-25 DIAGNOSIS — J45909 Unspecified asthma, uncomplicated: Secondary | ICD-10-CM

## 2011-05-25 MED ORDER — OMALIZUMAB 150 MG ~~LOC~~ SOLR
225.0000 mg | Freq: Once | SUBCUTANEOUS | Status: AC
  Administered 2011-05-25: 225 mg via SUBCUTANEOUS

## 2011-05-29 ENCOUNTER — Ambulatory Visit (INDEPENDENT_AMBULATORY_CARE_PROVIDER_SITE_OTHER): Payer: Medicare Other | Admitting: Internal Medicine

## 2011-05-29 ENCOUNTER — Encounter: Payer: Self-pay | Admitting: Internal Medicine

## 2011-05-29 DIAGNOSIS — I471 Supraventricular tachycardia: Secondary | ICD-10-CM

## 2011-05-29 DIAGNOSIS — I498 Other specified cardiac arrhythmias: Secondary | ICD-10-CM

## 2011-05-29 DIAGNOSIS — I4892 Unspecified atrial flutter: Secondary | ICD-10-CM

## 2011-05-29 NOTE — Progress Notes (Signed)
[name] is a pleasant 75 y.o. female with multiple comorbidities including COPD on chronic home oxygen, sleep apnea, and obesity who presents for follow-up of symptomatic atrial flutter and SVT.  The patient previously presented to [facility] in January of this year with symptoms of heart racing and was found to have supraventricular tachycardia, which was treated with adenosine.  Per the discharge summary, the adenosine had no effect on the arrhythmia, and the patient therefore was placed on Cardizem drip, which then converted her to sinus rhythm.  She apparently did well without any further arrhythmias until her presentation on January 16, 2011, when she developed recurrent symptomatic palpitations.  She reports associated shortness of breath.  She therefore presented to [facility] for further evaluation and was found to have atrial flutter with ventricular rates in the 150s.  She was placed on IV Cardizem and overnight converted from atrial flutter to sinus rhythm.  She was discharged, but returned 03/15/11 to the ER with recurrent tachypalpitations.  The ER reports suggest 2:1 atrial flutter which terminated with cardizem, though the only ekg available in [place] reveals sinus rhythm. She has done very well since that time.  Her SOB is stable on home O2.  She is tolerating cardizem without any further symptoms of arrhythmia.  She is otherwise without complaint today.  Today, she denies symptoms of chest pain, shortness of breath (above baseline),  lower extremity edema, dizziness, presyncope, syncope, or neurologic sequela. The patient is tolerating medications without difficulties and is otherwise without complaint today.   Past Medical History  Diagnosis Date  . Unspecified sinusitis (chronic)   . Obstructive sleep apnea   . COPD (chronic obstructive pulmonary disease)     on home O2, chronic steroid use  . Asthma   . Lung nodule   . SVT (supraventricular tachycardia)   . Cancer     lung ca s/p  XRT 2011  . Atrial flutter     typical appearing  . Hypothyroidism   . GERD (gastroesophageal reflux disease)   . Anxiety   . Depression    Past Surgical History  Procedure Date  . Hysterectomy     abdominal  . Tonsillectomy   . Cystectomy     removal from scalp  . Bladder repair     bladder tack  . Appendectomy   . Lung biopsy     Needle bx lung nodule - atypical cells- complicated by PTX/ chest tube    Current Outpatient Prescriptions  Medication Sig Dispense Refill  . ADVAIR DISKUS 250-50 MCG/DOSE AEPB inhale 1 dose by mouth twice a day  60 each  5  . Albuterol Sulfate (PROAIR HFA IN) Inhale into the lungs. AS directed       . buPROPion (WELLBUTRIN SR) 150 MG 12 hr tablet Take 150 mg by mouth 2 (two) times daily.        Marland Kitchen diltiazem (CARDIZEM CD) 240 MG 24 hr capsule Take 240 mg by mouth 2 (two) times daily.        Marland Kitchen EPINEPHrine (EPIPEN JR) 0.15 MG/0.3ML injection Inject 0.15 mg into the muscle as needed.        Marland Kitchen estrogens, conjugated, (PREMARIN) 0.625 MG tablet Take 0.625 mg by mouth daily. Take daily for 21 days then do not take for 7 days.       . fluticasone (FLONASE) 50 MCG/ACT nasal spray 2 sprays by Nasal route daily.        Marland Kitchen guaiFENesin (MUCINEX) 600 MG  12 hr tablet Take 1,200 mg by mouth as needed.        . Ipratropium-Albuterol (DUONEB IN) As directed       . levofloxacin (LEVAQUIN) 750 MG tablet One tablet a day x 5 days  5 tablet  0  . levothyroxine (SYNTHROID, LEVOTHROID) 75 MCG tablet Take 75 mcg by mouth daily.        . NON FORMULARY Allergy Vaccine 1:10 GH       . NON FORMULARY CPAP       . NON FORMULARY Oxygen 2-3 LPM       . omalizumab (XOLAIR) 150 MG injection Inject into the skin. 225mg  IM every 2 weeks       . predniSONE (DELTASONE) 5 MG tablet TAKE 1 TABLET BY MOUTH DAILY OR AS DIRECTED  100 tablet  1  . risedronate (ACTONEL) 35 MG tablet Take 35 mg by mouth every 7 (seven) days. with water on empty stomach, nothing by mouth or lie down for next 30  minutes.       Marland Kitchen SINGULAIR 10 MG tablet TAKE 1 TABLET BY MOUTH ONCE DAILY  30 tablet  6  . THEOPHYLLINE 300 MG 12 hr tablet take 1/2 tablet by mouth twice a day  30 tablet  5  . Tiotropium Bromide Monohydrate (SPIRIVA HANDIHALER IN) Inhale into the lungs. daily       . warfarin (COUMADIN) 5 MG tablet Take 5 mg by mouth daily. As directed       Current Facility-Administered Medications  Medication Dose Route Frequency Provider Last Rate Last Dose  . omalizumab Geoffry Paradise) injection 225 mg  225 mg Subcutaneous Once Waymon Budge, MD        Allergies  Allergen Reactions  . Clarithromycin     REACTION: nausea    History   Social History  . Marital Status: Married    Spouse Name: N/A    Number of Children: 2  . Years of Education: N/A   Occupational History  . Retired    Social History Main Topics  . Smoking status: Former Smoker    Quit date: 09/18/1986  . Smokeless tobacco: Never Used  . Alcohol Use: No  . Drug Use: No  . Sexually Active: Not on file   Other Topics Concern  . Not on file   Social History Narrative   Patient states former smoker.  Quit 1988.RetiredMarried with 2 children    Family History  Problem Relation Age of Onset  . Heart disease Mother 97    ROS- All systems are reviewed and negative except as per the HPI above  Physical Exam: Filed Vitals:   05/29/11 1233  BP: 148/64  Pulse: 96  Height: 5\' 5"  (1.651 m)  Weight: 164 lb 12.8 oz (74.753 kg)    GEN- The patient is chronically ill appearing, alert and oriented x 3 today.   Head- normocephalic, atraumatic Eyes-  Sclera clear, conjunctiva pink Ears- hearing intact Oropharynx- clear Neck- supple, no JVP Lymph- no cervical lymphadenopathy Lungs- prolonged expiratory phase, normal work of breathing Heart- Regular rate and rhythm, no murmurs, rubs or gallops, PMI not laterally displaced GI- soft, NT, ND, + BS Extremities- no clubbing, cyanosis,trace  edema MS- no significant deformity or  atrophy Skin- no rash or lesion Psych- euthymic mood, full affect Neuro- strength and sensation are intact  EKG today reveals sinus rhythm 96 bpm, nonspecific ST/T changes  Assessment and Plan:

## 2011-05-29 NOTE — Patient Instructions (Signed)
Call us if you decide to proceed with ablation

## 2011-05-29 NOTE — Assessment & Plan Note (Addendum)
[name] is a pleasant 75 year old female who now presents for follow-up of symptomatic typical-appearing atrial flutter.  We had a long discussion again today regarding therapeutic strategies.  Given her comorbidities, I think that we should avoid antiarrhythmic medicines at this time. She is presently doing rather well on Cardizem CD 240 mg twice daily.  We had a long discussion about risks, benefits, and alternatives to do EP study and radiofrequency ablation today.  Presently, the patient and her spouse   continue to wish to avoid catheter ablation if possible.  If she develops recurrent atrial flutter, however, they would be more willing to consider catheter ablation down the road.  We will plan therefore to discontinue the patient on Coumadin and Cardizem.  As her underlying lung disease is likely the trigger for her atrial flutter, there is a high likelihood that she may develop atrial fibrillation down the road even if we ablate her atrial flutter.  For this reason, I think a strategy of watchful waiting is reasonable.  She will follow closely with Dr Eldridge Dace and I will see her as needed.  She will contact my office if she wishes to proceed with ablation.

## 2011-05-29 NOTE — Assessment & Plan Note (Signed)
EKGs are reviewed and reveal typical-appearing atrial flutter (as above).  I have also reviewed an EKG from October 12, 2010, which reveals a narrow   complex tachycardia at 216 beats per minute.  This could represent 1:1 conducting atrial flutter or more likely supraventricular tachycardia.  If she decides to have EPS in the future, we will also evaluate for other arrhythmias including reentrant arrhythmias at that time.  Continue cardizem as above.

## 2011-06-02 ENCOUNTER — Ambulatory Visit (INDEPENDENT_AMBULATORY_CARE_PROVIDER_SITE_OTHER): Payer: Medicare Other | Admitting: Internal Medicine

## 2011-06-02 ENCOUNTER — Encounter: Payer: Self-pay | Admitting: Internal Medicine

## 2011-06-02 VITALS — BP 138/62 | HR 99 | Ht 65.0 in | Wt 169.0 lb

## 2011-06-02 DIAGNOSIS — Z23 Encounter for immunization: Secondary | ICD-10-CM

## 2011-06-02 DIAGNOSIS — J4489 Other specified chronic obstructive pulmonary disease: Secondary | ICD-10-CM

## 2011-06-02 DIAGNOSIS — J449 Chronic obstructive pulmonary disease, unspecified: Secondary | ICD-10-CM

## 2011-06-02 DIAGNOSIS — J984 Other disorders of lung: Secondary | ICD-10-CM

## 2011-06-02 DIAGNOSIS — J309 Allergic rhinitis, unspecified: Secondary | ICD-10-CM

## 2011-06-02 MED ORDER — AMOXICILLIN 500 MG PO TABS: 500.0000 mg | ORAL_TABLET | Freq: Three times a day (TID) | ORAL | Status: AC

## 2011-06-02 MED ORDER — HYDROCODONE-HOMATROPINE 5-1.5 MG/5ML PO SYRP: 2.5000 mL | ORAL_SOLUTION | Freq: Four times a day (QID) | ORAL | Status: AC | PRN

## 2011-06-02 NOTE — Assessment & Plan Note (Signed)
Recent visit w/ Dr Kathrynn Running- told her it was even smaller than expected.

## 2011-06-02 NOTE — Assessment & Plan Note (Signed)
Indolent rhinitis. Nonspecific viral vs fall allergy. We will give antibiotic to hold and refill cough syrup. Ok to get flu vax.

## 2011-06-02 NOTE — Progress Notes (Signed)
Subjective:    Patient ID: [name], female    DOB: 04-May-1936, 75 y.o.   MRN: [id number]  HPI    Review of Systems     Objective:   Physical Exam        Assessment & Plan:   Subjective:    Patient ID: [name], female    DOB: Jan 31, 1936, 75 y.o.   MRN: [id number]  HPI 53/12- 82 yo former smoker with severe COPD/ chronic hypoxic respiratory failure, hx lung nodule treated with XRT as presumptive Ca w/o bx. OSA  Husband here. Last here November 28, 2010. Since then was hosp for tachyarrythmia- considered for ablation. She was concerned about risk of being put to sleep and wanted to discuss it here.  Now feels sore at strap level around chest, very congested and wheezy. Nebulizer helps but needing frequently. Malaise. Sputum white, no fever. Feet feel still a little swollen. Continues prednisone 5 mg every other day.   06/02/11-74 yo former smoker with severe COPD/ chronic hypoxic respiratory failure, hx lung nodule treated with XRT as presumptive Ca w/o bx. OSA, SVT/ coumadin. Fighting a ?cold? for a couple of weeks with dry cough, some head congestion. Denies fever, green or sore throat. No blood, pain. Asks cough syrup to hold. Discussed antibiotics on coumadin.   Review of Systems Constitutional:   No weight loss, night sweats,  Fevers, chills, fatigue, lassitude. HEENT:   No headaches,  Difficulty swallowing,  Tooth/dental problems,  Sore throat,                No sneezing, itching, ear ache, nasal congestion, post nasal drip,  CV:  No chest pain,  Orthopnea, PND, swelling in lower extremities, anasarca, dizziness, palpitations GI  No heartburn, indigestion, abdominal pain, nausea, vomiting, diarrhea, change in bowel habits, loss of appetite Resp:No excess mucus, No coughing up of blood.  No change in color of mucus.  Skin: no rash or lesions. GU: no dysuria, change in color of urine, no urgency or frequency.  No flank pain. MS:  No joint pain or swelling.  No  decreased range of motion.  No back pain. Psych:  No change in mood or affect. No depression or anxiety.  No memory loss.      Objective:   Physical Exam General- Alert, Oriented, Affect-appropriate, Distress- none acute        O2 -sitting here trying room air.  Skin- rash-none, lesions- none, excoriation- none Lymphadenopathy- none Head- atraumatic            Eyes- Gross vision intact, PERRLA, conjunctivae clear secretions            Ears- Hearing, canals normal            Nose- Clear, no-Septal dev, mucus, polyps, erosion, perforation    Chronic nasal speech            Throat- Mallampati IV , mucosa clear , drainage- none, tonsils- atrophic Neck- flexible , trachea midline, no stridor , thyroid nl, carotid no bruit Chest - symmetrical excursion , unlabored           Heart/CV- RRR , no murmur , no gallop  , no rub, nl s1 s2                           - JVD- none , edema- none, stasis changes- none, varices- none           Lung-  distant, wheeze- none, cough- raspy  , dullness-none, rub- none           Chest wall-  Abd- tender-no, distended-no, bowel sounds-present, HSM- no Br/ Gen/ Rectal- Not done, not indicated Extrem- cyanosis- none, clubbing, none, atrophy- none, strength- nl Neuro- grossly intact to observation      Assessment & Plan:

## 2011-06-02 NOTE — Assessment & Plan Note (Addendum)
Severe COPD with chronic hypoxic respiratory failure. Continued use of home oxygen is useful and necessary.

## 2011-06-02 NOTE — Patient Instructions (Signed)
Flu vax  Scripts for cough syrup and for amoxacillin  Saline nasal rinse and mucinex might help stuffiness in nose

## 2011-06-02 NOTE — Progress Notes (Signed)
Subjective:    Patient ID: [name], female    DOB: 01-Feb-1936, 75 y.o.   MRN: [id number]  HPI Subjective:    Patient ID: [name], female    DOB: 1936/07/30, 75 y.o.   MRN: [id number]  HPI 53/12- 45 yo former smoker with severe COPD/ chronic hypoxic respiratory failure, hx lung nodule treated with XRT as presumptive Ca w/o bx. OSA  Husband here. Last here November 28, 2010. Since then was hosp for tachyarrythmia- considered for ablation. She was concerned about risk of being put to sleep and wanted to discuss it here.  Now feels sore at strap level around chest, very congested and wheezy. Nebulizer helps but needing frequently. Malaise. Sputum white, no fever. Feet feel still a little swollen. Continues prednisone 5 mg every other day.   06/02/11- 46 yo former smoker with severe COPD/ chronic hypoxic respiratory failure, hx lung nodule treated with XRT as presumptive Ca w/o bx. OSA  Doing better since last visit. Less drainage and less cough. Breathing comfortable at rest on room air but not with any exertion. Theophylline level low 03/15/11- not likely to contribute to irregular heart beat.  Continues O2, low dose prednisone and Xolair.      Review of Systems Constitutional:   No weight loss, night sweats,  Fevers, chills, fatigue, lassitude. HEENT:   No headaches,  Difficulty swallowing,  Tooth/dental problems,  Sore throat,                No sneezing, itching, ear ache, nasal congestion, post nasal drip,  CV:  No chest pain,  Orthopnea, PND, swelling in lower extremities, anasarca, dizziness, palpitations GI  No heartburn, indigestion, abdominal pain, nausea, vomiting, diarrhea, change in bowel habits, loss of appetite Resp:No excess mucus, No coughing up of blood.  No change in color of mucus.  Skin: no rash or lesions. GU: no dysuria, change in color of urine, no urgency or frequency.  No flank pain. MS:  No joint pain or swelling.  No decreased range of motion.  No back  pain. Psych:  No change in mood or affect. No depression or anxiety.  No memory loss.      Objective:   Physical Exam General- Alert, Oriented, Affect-appropriate, Distress- none acute   O2 on 3.5 L/M Skin- rash-none, lesions- none, excoriation- none Lymphadenopathy- none Head- atraumatic            Eyes- Gross vision intact, PERRLA, conjunctivae clear secretions            Ears- Hearing, canals            Nose- Clear, Septal dev, mucus, polyps, erosion, perforation. Chronic nasal quality to speech.              Throat- Mallampati II , mucosa clear , drainage- none, tonsils- atrophic Neck- flexible , trachea midline, no stridor , thyroid nl, carotid no bruit Chest - symmetrical excursion , unlabored           Heart/CV- RRR , no murmur , no gallop  , no rub, nl s1 s2                           - JVD- none , edema- none, stasis changes- none, varices- none           Lung- Raspy cough, right chest wheeze , dullness-none, rub- none           Chest wall-  Abd- tender-no, distended-no, bowel sounds-present, HSM- no Br/ Gen/ Rectal- Not done, not indicated Extrem- cyanosis- none, clubbing, none, atrophy- none, strength- nl Neuro- grossly intact to observation          Assessment & Plan:      Review of Systems     Objective:   Physical Exam        Assessment & Plan:

## 2011-06-22 ENCOUNTER — Ambulatory Visit (INDEPENDENT_AMBULATORY_CARE_PROVIDER_SITE_OTHER): Payer: Medicare Other

## 2011-06-22 DIAGNOSIS — J45909 Unspecified asthma, uncomplicated: Secondary | ICD-10-CM

## 2011-06-22 MED ORDER — OMALIZUMAB 150 MG ~~LOC~~ SOLR
225.0000 mg | Freq: Once | SUBCUTANEOUS | Status: AC
  Administered 2011-06-22: 225 mg via SUBCUTANEOUS

## 2011-07-11 ENCOUNTER — Other Ambulatory Visit: Payer: Self-pay | Admitting: Internal Medicine

## 2011-07-14 ENCOUNTER — Ambulatory Visit (INDEPENDENT_AMBULATORY_CARE_PROVIDER_SITE_OTHER): Payer: Medicare Other

## 2011-07-14 DIAGNOSIS — J309 Allergic rhinitis, unspecified: Secondary | ICD-10-CM

## 2011-07-14 DIAGNOSIS — J45909 Unspecified asthma, uncomplicated: Secondary | ICD-10-CM

## 2011-07-17 DIAGNOSIS — J45909 Unspecified asthma, uncomplicated: Secondary | ICD-10-CM

## 2011-07-17 MED ORDER — OMALIZUMAB 150 MG ~~LOC~~ SOLR
225.0000 mg | Freq: Once | SUBCUTANEOUS | Status: AC
  Administered 2011-07-17: 225 mg via SUBCUTANEOUS

## 2011-08-02 ENCOUNTER — Ambulatory Visit (INDEPENDENT_AMBULATORY_CARE_PROVIDER_SITE_OTHER): Payer: Medicare Other

## 2011-08-02 DIAGNOSIS — J45909 Unspecified asthma, uncomplicated: Secondary | ICD-10-CM

## 2011-08-02 DIAGNOSIS — J309 Allergic rhinitis, unspecified: Secondary | ICD-10-CM

## 2011-08-06 DIAGNOSIS — J45909 Unspecified asthma, uncomplicated: Secondary | ICD-10-CM

## 2011-08-06 MED ORDER — OMALIZUMAB 150 MG ~~LOC~~ SOLR
225.0000 mg | Freq: Once | SUBCUTANEOUS | Status: AC
  Administered 2011-08-06: 225 mg via SUBCUTANEOUS

## 2011-08-14 ENCOUNTER — Encounter: Payer: Self-pay | Admitting: Internal Medicine

## 2011-08-14 ENCOUNTER — Other Ambulatory Visit: Payer: Self-pay | Admitting: Internal Medicine

## 2011-08-15 MED ORDER — FLUTICASONE-SALMETEROL 250-50 MCG/DOSE IN AEPB: 1.0000 | INHALATION_SPRAY | Freq: Two times a day (BID) | RESPIRATORY_TRACT | Status: DC

## 2011-08-15 NOTE — Telephone Encounter (Signed)
Addended by: Reynaldo Minium C on: 08/15/2011 11:55 AM   Modules accepted: Orders

## 2011-08-17 ENCOUNTER — Ambulatory Visit (INDEPENDENT_AMBULATORY_CARE_PROVIDER_SITE_OTHER): Payer: Medicare Other

## 2011-08-17 DIAGNOSIS — J309 Allergic rhinitis, unspecified: Secondary | ICD-10-CM

## 2011-08-17 DIAGNOSIS — J45909 Unspecified asthma, uncomplicated: Secondary | ICD-10-CM

## 2011-08-18 DIAGNOSIS — J45909 Unspecified asthma, uncomplicated: Secondary | ICD-10-CM

## 2011-08-18 MED ORDER — OMALIZUMAB 150 MG ~~LOC~~ SOLR
225.0000 mg | Freq: Once | SUBCUTANEOUS | Status: AC
  Administered 2011-08-18: 225 mg via SUBCUTANEOUS

## 2011-09-15 ENCOUNTER — Ambulatory Visit (INDEPENDENT_AMBULATORY_CARE_PROVIDER_SITE_OTHER): Payer: Medicare Other

## 2011-09-15 DIAGNOSIS — J45909 Unspecified asthma, uncomplicated: Secondary | ICD-10-CM

## 2011-09-18 ENCOUNTER — Telehealth: Payer: Self-pay | Admitting: Internal Medicine

## 2011-09-18 DIAGNOSIS — J45909 Unspecified asthma, uncomplicated: Secondary | ICD-10-CM

## 2011-09-18 MED ORDER — OMALIZUMAB 150 MG ~~LOC~~ SOLR
225.0000 mg | Freq: Once | SUBCUTANEOUS | Status: AC
  Administered 2011-09-18: 225 mg via SUBCUTANEOUS

## 2011-09-18 MED ORDER — DOXYCYCLINE HYCLATE 100 MG PO TABS: ORAL_TABLET | ORAL | Status: DC

## 2011-09-18 NOTE — Telephone Encounter (Signed)
Per CY-okay to give Doxycycline 100 mg #8 take 2 today then 1 daily no refills.  

## 2011-09-18 NOTE — Telephone Encounter (Signed)
Pt is aware of cdy recs and rx was called into [facility]

## 2011-09-18 NOTE — Telephone Encounter (Signed)
I spoke with pt and she c/o cough w/ thick green phlem, runny nose, PND, stuffy nose, body aches, low grade temp 99.9 x 4  Days. Pt states she has been taking mucinex and using her netti pot. Pt is requesting to have something called in for her. Please advise Dr. Maple Hudson, thanks  Allergies  Allergen Reactions  . Clarithromycin     REACTION: nausea

## 2011-09-28 ENCOUNTER — Ambulatory Visit: Payer: Medicare Other | Admitting: Radiation Oncology

## 2011-10-02 ENCOUNTER — Ambulatory Visit: Payer: Medicare Other | Admitting: Internal Medicine

## 2011-10-03 ENCOUNTER — Ambulatory Visit (INDEPENDENT_AMBULATORY_CARE_PROVIDER_SITE_OTHER): Payer: Medicare Other

## 2011-10-03 ENCOUNTER — Encounter: Payer: Self-pay | Admitting: Internal Medicine

## 2011-10-03 ENCOUNTER — Ambulatory Visit (INDEPENDENT_AMBULATORY_CARE_PROVIDER_SITE_OTHER): Payer: Medicare Other | Admitting: Internal Medicine

## 2011-10-03 VITALS — BP 118/58 | HR 90 | Ht 65.0 in | Wt 157.0 lb

## 2011-10-03 DIAGNOSIS — I498 Other specified cardiac arrhythmias: Secondary | ICD-10-CM

## 2011-10-03 DIAGNOSIS — J45909 Unspecified asthma, uncomplicated: Secondary | ICD-10-CM

## 2011-10-03 DIAGNOSIS — I471 Supraventricular tachycardia: Secondary | ICD-10-CM

## 2011-10-03 DIAGNOSIS — J449 Chronic obstructive pulmonary disease, unspecified: Secondary | ICD-10-CM

## 2011-10-03 DIAGNOSIS — G4733 Obstructive sleep apnea (adult) (pediatric): Secondary | ICD-10-CM

## 2011-10-03 NOTE — Progress Notes (Signed)
Patient ID: [name], female    DOB: Jul 11, 1936, 76 y.o.   MRN: [id number]  HPI 53/12- 72 yo former smoker with severe COPD/ chronic hypoxic respiratory failure, hx lung nodule treated with XRT as presumptive Ca w/o bx. OSA  Husband here. Last here November 28, 2010. Since then was hosp for tachyarrythmia- considered for ablation. She was concerned about risk of being put to sleep and wanted to discuss it here.  Now feels sore at strap level around chest, very congested and wheezy. Nebulizer helps but needing frequently. Malaise. Sputum white, no fever. Feet feel still a little swollen. Continues prednisone 5 mg every other day.   06/02/11- 34 yo former smoker with severe COPD/ chronic hypoxic respiratory failure, hx lung nodule treated with XRT as presumptive Ca w/o bx. OSA  Doing better since last visit. Less drainage and less cough. Breathing comfortable at rest on room air but not with any exertion. Theophylline level low 03/15/11- not likely to contribute to irregular heart beat.  Continues O2, low dose prednisone and Xolair.   10/03/11-  8 yo former smoker with severe COPD/ chronic hypoxic respiratory failure, hx lung nodule treated with XRT as presumptive Ca w/o bx, OSA Husband here. More increased SOB than usual and pain in back and chest area over past 2-3 days(recently had flu) Heart rate went up to 150s an hour or two after pain started. Had to double up on ditiazem due to increased heart palpitation-CP stopped once med taken. She reported this to her cardiology office, who referred her here. Today she feels back to normal, just tired.   Sleep Apnea -Wears CPAP for approx 6-8 hours at night.  Review of Systems Constitutional:   No weight loss, night sweats, fevers, chills, fatigue, lassitude. HEENT:   No headaches,  Difficulty swallowing,  Tooth/dental problems,  Sore throat,                No sneezing, itching, ear ache, nasal congestion, post nasal drip,  CV:  + chest pain,   No-orthopnea, PND, swelling in lower extremities, anasarca, dizziness, palpitations GI  No heartburn, + indigestion,  No-abdominal pain, nausea, vomiting, diarrhea, change in bowel habits, loss of appetite Resp:No excess mucus, No coughing up of blood.  No change in color of mucus.  Skin: no rash or lesions. GU:  MS:  No joint pain or swelling.  No decreased range of motion.  No back pain. Psych:  No change in mood or affect. No depression or anxiety.  No memory loss.      Objective:   Physical Exam General- Alert, Oriented, Affect-appropriate, Distress- none acute   O2 on 3.5 L/M Skin- rash-none, lesions- none, excoriation- none Lymphadenopathy- none Head- atraumatic            Eyes- Gross vision intact, PERRLA, conjunctivae clear secretions            Ears- Hearing, canals            Nose- Clear, Septal dev, mucus, polyps, erosion, perforation. Chronic nasal quality to speech.              Throat- Mallampati II , mucosa clear , drainage- none, tonsils- atrophic Neck- flexible , trachea midline, no stridor , thyroid nl, carotid no bruit Chest - symmetrical excursion , unlabored           Heart/CV- RRR , no murmur , no gallop  , no rub, nl s1 s2                           -  JVD- none , edema- none, stasis changes- none, varices- none           Lung- barking cough, no wheeze , dullness-none, rub- none, unlabored           Chest wall-  Abd-  Br/ Gen/ Rectal- Not done, not indicated Extrem- cyanosis- none, clubbing, none, atrophy- none, strength- nl Neuro- grossly intact to observation

## 2011-10-03 NOTE — Patient Instructions (Signed)
Continue present treatment  Please call as needed 

## 2011-10-04 DIAGNOSIS — J45909 Unspecified asthma, uncomplicated: Secondary | ICD-10-CM

## 2011-10-04 MED ORDER — OMALIZUMAB 150 MG ~~LOC~~ SOLR
225.0000 mg | Freq: Once | SUBCUTANEOUS | Status: AC
  Administered 2011-10-04: 225 mg via SUBCUTANEOUS

## 2011-10-05 ENCOUNTER — Encounter: Payer: Self-pay | Admitting: Internal Medicine

## 2011-10-05 NOTE — Assessment & Plan Note (Signed)
Sounds as if she had a recent episode of SVT, resolved by an extra dose of diltiazem. I assume the chest pain and heart rate were related, but possibly the chest pain and indigestion or GI and not cardiac. The episode is now resolved. She will be following up with cardiology.

## 2011-10-05 NOTE — Assessment & Plan Note (Signed)
Very severe COPD with chronic hypoxic respiratory failure. She seems near her baseline now.

## 2011-10-05 NOTE — Assessment & Plan Note (Signed)
She continues good compliance and control with CPAP.

## 2011-10-10 ENCOUNTER — Ambulatory Visit (INDEPENDENT_AMBULATORY_CARE_PROVIDER_SITE_OTHER): Payer: Medicare Other

## 2011-10-10 DIAGNOSIS — J309 Allergic rhinitis, unspecified: Secondary | ICD-10-CM

## 2011-10-16 ENCOUNTER — Telehealth: Payer: Self-pay | Admitting: Internal Medicine

## 2011-10-16 MED ORDER — CEFDINIR 300 MG PO CAPS: 300.0000 mg | ORAL_CAPSULE | Freq: Two times a day (BID) | ORAL | Status: AC

## 2011-10-16 NOTE — Telephone Encounter (Signed)
Per CY-okay to give Cefdinir 300 mg #20 take 1 po bid no refills.  

## 2011-10-16 NOTE — Telephone Encounter (Signed)
Spoke with pt. She is c/o congestion in head and chest, prod cough with minimal yellow to white sputum, wheezing, and soreness in chest and back (pt related to cough)- onset 1 wk ago.  She states took mucinex for a few days but stopped this 1 day ago b/c she felt it was making her symptoms worse. Would like recs from CDY. Please advise, thanks! Allergies  Allergen Reactions  . Clarithromycin     REACTION: nausea

## 2011-10-16 NOTE — Telephone Encounter (Signed)
Spoke with pt and notified of recs per CD[facility] She verbalized understanding and rx was sent to pharm  

## 2011-10-23 ENCOUNTER — Ambulatory Visit: Payer: Medicare Other

## 2011-10-25 ENCOUNTER — Ambulatory Visit (INDEPENDENT_AMBULATORY_CARE_PROVIDER_SITE_OTHER): Payer: Medicare Other

## 2011-10-25 ENCOUNTER — Other Ambulatory Visit: Payer: Self-pay | Admitting: Internal Medicine

## 2011-10-25 DIAGNOSIS — J309 Allergic rhinitis, unspecified: Secondary | ICD-10-CM

## 2011-10-25 DIAGNOSIS — Z1231 Encounter for screening mammogram for malignant neoplasm of breast: Secondary | ICD-10-CM

## 2011-10-25 DIAGNOSIS — J45909 Unspecified asthma, uncomplicated: Secondary | ICD-10-CM

## 2011-10-25 MED ORDER — OMALIZUMAB 150 MG ~~LOC~~ SOLR
225.0000 mg | Freq: Once | SUBCUTANEOUS | Status: AC
  Administered 2011-10-25: 225 mg via SUBCUTANEOUS

## 2011-10-31 ENCOUNTER — Encounter: Payer: Self-pay | Admitting: *Deleted

## 2011-10-31 DIAGNOSIS — C3490 Malignant neoplasm of unspecified part of unspecified bronchus or lung: Secondary | ICD-10-CM | POA: Insufficient documentation

## 2011-11-02 ENCOUNTER — Ambulatory Visit: Payer: Medicare Other

## 2011-11-02 ENCOUNTER — Ambulatory Visit: Payer: Medicare Other | Attending: Radiation Oncology | Admitting: Radiation Oncology

## 2011-11-09 ENCOUNTER — Telehealth: Payer: Self-pay | Admitting: Internal Medicine

## 2011-11-09 ENCOUNTER — Other Ambulatory Visit: Payer: Self-pay | Admitting: Internal Medicine

## 2011-11-09 NOTE — Telephone Encounter (Signed)
Per Cy-okay to add patient in on schedule tomorrow morning. Pt will be here at 845am to see CY; understands if weather is bad and cant get here then we will figure something else out for her. Pt is aware that we MIGHT have a delay in morning due to weather.

## 2011-11-10 ENCOUNTER — Encounter: Payer: Self-pay | Admitting: Internal Medicine

## 2011-11-10 ENCOUNTER — Ambulatory Visit (INDEPENDENT_AMBULATORY_CARE_PROVIDER_SITE_OTHER)
Admission: RE | Admit: 2011-11-10 | Discharge: 2011-11-10 | Disposition: A | Discharge status: Home / Self Care | Payer: Medicare Other | Source: Ambulatory Visit | Attending: Adult Health | Admitting: Adult Health

## 2011-11-10 ENCOUNTER — Ambulatory Visit (INDEPENDENT_AMBULATORY_CARE_PROVIDER_SITE_OTHER): Payer: Medicare Other

## 2011-11-10 ENCOUNTER — Telehealth: Payer: Self-pay | Admitting: Internal Medicine

## 2011-11-10 ENCOUNTER — Ambulatory Visit (INDEPENDENT_AMBULATORY_CARE_PROVIDER_SITE_OTHER): Payer: Medicare Other | Admitting: Internal Medicine

## 2011-11-10 VITALS — BP 136/72 | HR 104 | Ht 65.0 in | Wt 155.4 lb

## 2011-11-10 DIAGNOSIS — M7918 Myalgia, other site: Secondary | ICD-10-CM

## 2011-11-10 DIAGNOSIS — J45909 Unspecified asthma, uncomplicated: Secondary | ICD-10-CM

## 2011-11-10 DIAGNOSIS — J309 Allergic rhinitis, unspecified: Secondary | ICD-10-CM

## 2011-11-10 DIAGNOSIS — IMO0001 Reserved for inherently not codable concepts without codable children: Secondary | ICD-10-CM

## 2011-11-10 DIAGNOSIS — J449 Chronic obstructive pulmonary disease, unspecified: Secondary | ICD-10-CM

## 2011-11-10 MED ORDER — OMALIZUMAB 150 MG ~~LOC~~ SOLR
225.0000 mg | Freq: Once | SUBCUTANEOUS | Status: AC
  Administered 2011-11-10: 225 mg via SUBCUTANEOUS

## 2011-11-10 MED ORDER — FLUTICASONE PROPIONATE 50 MCG/ACT NA SUSP: 2.0000 | Freq: Every day | NASAL | Status: DC

## 2011-11-10 NOTE — Telephone Encounter (Signed)
Patients Rx for prednisone 5mg  #100 take 1 po qd or as directed will not go through for insurance-will need more specific directions please; other wise patient will only be able to get Rx at 1 po qd. Please advise. Thanks.

## 2011-11-10 NOTE — Patient Instructions (Signed)
I think this is a compression fracture of one of the vertebrae in your back, causing musculoskeletal pain. This commonly eases gradually over time.  Ok to try up to 2 of your tramadol every 6 hours if needed, with Advil or tylenol in between if you need a little more.  The heating pad and liniment are fine.  ------------ Order- CXR  Dx COPD,  Back pain/ thoracic spine

## 2011-11-10 NOTE — Telephone Encounter (Signed)
Per CY-okay to give Prednisone instructions as #100 take 4 x 2 days, 3 x 2 days, 2 x 2 days, 1 x 2 days, then 1 daily or as directed. I called and gave instructions to [facility]; but per [facility] call patient and tell her to keep taking as 1 daily or as directed. Pt aware.

## 2011-11-10 NOTE — Progress Notes (Signed)
Patient ID: [name], female    DOB: 09-23-1935, 76 y.o.   MRN: [id number]  HPI 53/12- 42 yo former smoker with severe COPD/ chronic hypoxic respiratory failure, hx lung nodule treated with XRT as presumptive Ca w/o bx. OSA  Husband here. Last here November 28, 2010. Since then was hosp for tachyarrythmia- considered for ablation. She was concerned about risk of being put to sleep and wanted to discuss it here.  Now feels sore at strap level around chest, very congested and wheezy. Nebulizer helps but needing frequently. Malaise. Sputum white, no fever. Feet feel still a little swollen. Continues prednisone 5 mg every other day.   06/02/11- 78 yo former smoker with severe COPD/ chronic hypoxic respiratory failure, hx lung nodule treated with XRT as presumptive Ca w/o bx. OSA  Doing better since last visit. Less drainage and less cough. Breathing comfortable at rest on room air but not with any exertion. Theophylline level low 03/15/11- not likely to contribute to irregular heart beat.  Continues O2, low dose prednisone and Xolair.   10/03/11-  78 yo former smoker with severe COPD/ chronic hypoxic respiratory failure, hx lung nodule treated with XRT as presumptive Ca w/o bx, OSA Husband here. More increased SOB than usual and pain in back and chest area over past 2-3 days(recently had flu) Heart rate went up to 150s an hour or two after pain started. Had to double up on ditiazem due to increased heart palpitation-CP stopped once med taken. She reported this to her cardiology office, who referred her here. Today she feels back to normal, just tired.   Sleep Apnea -Wears CPAP for approx 6-8 hours at night.  11/10/11- 27 yo former smoker with severe COPD/ chronic hypoxic respiratory failure, hx lung nodule treated with XRT as presumptive Ca w/o bx, OSA Husband here. Acute visit- C/O: pain in upper back and around sides(pain moves); consistent pain but more intense with inhaling.     Review of  Systems- see HPI Constitutional:   No weight loss, night sweats, fevers, chills, fatigue, lassitude. HEENT:   No headaches,  Difficulty swallowing,  Tooth/dental problems,  Sore throat,                No sneezing, itching, ear ache, nasal congestion, post nasal drip,  CV:  + chest pain,  No-orthopnea, PND, swelling in lower extremities, anasarca, dizziness, palpitations GI  No heartburn, + indigestion,  No-abdominal pain, nausea, vomiting, diarrhea, change in bowel habits, loss of appetite Resp:No excess mucus, No coughing up of blood.  No change in color of mucus.  Skin: no rash or lesions. GU:  MS:  No joint pain or swelling.  No decreased range of motion.  + back pain. Psych:  No change in mood or affect. No depression or anxiety.  No memory loss.      Objective:   Physical Exam General- Alert, Oriented, Affect-appropriate, Distress- none acute   O2 on 3.5 L/M Skin- rash-none, lesions- none, excoriation- none Lymphadenopathy- none Head- atraumatic            Eyes- Gross vision intact, PERRLA, conjunctivae clear secretions            Ears- Hearing, canals            Nose- Clear, Septal dev, mucus, polyps, erosion, perforation. Chronic nasal quality to speech.              Throat- Mallampati II , mucosa clear , drainage- none, tonsils- atrophic  Neck- flexible , trachea midline, no stridor , thyroid nl, carotid no bruit Chest - symmetrical excursion , unlabored           Heart/CV- RRR , no murmur , no gallop  , no rub, nl s1 s2                           - JVD- none , edema- none, stasis changes- none, varices- none           Lung- dry cough, I&E wheeze , dullness-none, rub- none, unlabored           Chest wall-  Abd-  Br/ Gen/ Rectal- Not done, not indicated Extrem- cyanosis- none, clubbing, none, atrophy- none, strength- nl Neuro- grossly intact to observation

## 2011-11-14 DIAGNOSIS — M7918 Myalgia, other site: Secondary | ICD-10-CM | POA: Insufficient documentation

## 2011-11-14 NOTE — Assessment & Plan Note (Signed)
This pain seems characteristic of a vertebral compression fracture in mid thoracic spine with radiculitis bilaterally around the rib cage. Plan-heat, increase tramadol to 2 tablets every 6 hours if needed, chest x-ray.

## 2011-11-30 ENCOUNTER — Telehealth: Payer: Self-pay | Admitting: Radiation Oncology

## 2011-11-30 ENCOUNTER — Encounter: Payer: Self-pay | Admitting: Radiation Oncology

## 2011-11-30 ENCOUNTER — Ambulatory Visit
Admission: RE | Admit: 2011-11-30 | Discharge: 2011-11-30 | Disposition: A | Discharge status: Home / Self Care | Payer: Medicare Other | Source: Ambulatory Visit | Attending: Radiation Oncology | Admitting: Radiation Oncology

## 2011-11-30 VITALS — Wt 148.7 lb

## 2011-11-30 DIAGNOSIS — C3490 Malignant neoplasm of unspecified part of unspecified bronchus or lung: Secondary | ICD-10-CM

## 2011-11-30 MED ORDER — ZOLPIDEM TARTRATE 5 MG PO TABS: 5.0000 mg | ORAL_TABLET | Freq: Every evening | ORAL | Status: DC | PRN

## 2011-11-30 NOTE — Progress Notes (Signed)
Follow up lung cancer Radiation treatments done= 11/08/09-11/18/09 left upper lung Pt arrived via w/c and oxygen on 3 liters n/c=92%, pt cancelled her mammogram, in Feb. 2013,hasn't rescheduled as yet, on prednisone, patient just got over over the flu, in February, ,no c/o pain at present,stated, patient is short of breath just from getting in car to here 2:59 PM

## 2011-11-30 NOTE — Progress Notes (Signed)
Radiation Oncology         [phone] ________________________________  Name: [name] MRN: [id number]  Date: 11/30/2011  DOB: [birth year]  Follow-Up Visit Note  CC: Clinton D. Maple Hudson, MD, FCCP, FACP  Ines Bloomer, M.D.   DIAGNOSIS:  This is a 76 year old woman status post stereotactic body radiotherapy for clinical stage I non-small-cell carcinoma of the lung.  INTERVAL SINCE LAST RADIATION:  24 months.  NARRATIVE:  Ms. Ryback returns today for routine followup. She underwent followup chest x-ray on 2/22.  This x-ray showed no significant change within the chest.  The patient continues to suffer with active COPD and follows closely with Dr. Jetty Duhamel regarding this.  She is due for chest CT.                              ALLERGIES:  is allergic to clarithromycin.  Meds: Current Outpatient Prescriptions  Medication Sig Dispense Refill  . Albuterol Sulfate (PROAIR HFA IN) Inhale into the lungs. AS directed       . buPROPion (WELLBUTRIN SR) 150 MG 12 hr tablet Take 150 mg by mouth 2 (two) times daily.        Marland Kitchen diltiazem (CARDIZEM CD) 240 MG 24 hr capsule Take 240 mg by mouth 2 (two) times daily.        Marland Kitchen EPINEPHrine (EPIPEN JR) 0.15 MG/0.3ML injection Inject 0.15 mg into the muscle as needed.        Marland Kitchen estrogens, conjugated, (PREMARIN) 0.625 MG tablet Take 0.625 mg by mouth daily. Take daily for 21 days then do not take for 7 days.       . fluticasone (FLONASE) 50 MCG/ACT nasal spray Place 2 sprays into the nose daily.  1 g  11  . Fluticasone-Salmeterol (ADVAIR DISKUS) 250-50 MCG/DOSE AEPB Inhale 1 puff into the lungs 2 (two) times daily.  60 each  2  . guaiFENesin (MUCINEX) 600 MG 12 hr tablet Take 1,200 mg by mouth as needed.        . Ipratropium-Albuterol (DUONEB IN) As directed       . levothyroxine (SYNTHROID, LEVOTHROID) 75 MCG tablet Take 75 mcg by mouth daily.        . NON FORMULARY Allergy Vaccine 1:10 GH       . NON FORMULARY CPAP       . NON FORMULARY Oxygen  2-3 LPM       . omalizumab (XOLAIR) 150 MG injection Inject into the skin. 225mg  IM every 2 weeks       . predniSONE (DELTASONE) 5 MG tablet TAKE 1 TABLET BY MOUTH DAILY OR AS DIRECTED  100 tablet  1  . risedronate (ACTONEL) 35 MG tablet Take 35 mg by mouth every 7 (seven) days. with water on empty stomach, nothing by mouth or lie down for next 30 minutes.       Marland Kitchen SINGULAIR 10 MG tablet take 1 tablet by mouth once daily  30 tablet  6  . theophylline (THEODUR) 300 MG 12 hr tablet take 1/2 tablet by mouth twice a day  30 tablet  5  . Tiotropium Bromide Monohydrate (SPIRIVA HANDIHALER IN) Inhale into the lungs. daily       . traMADol (ULTRAM) 50 MG tablet Take 1 tablet by mouth 4 (four) times daily as needed.      . warfarin (COUMADIN) 5 MG tablet Take 5 mg by mouth daily. As directed  Physical Findings: The patient is in no acute distress. Patient is alert and oriented.  Lung sounds are distant, but lungs are clear throughout.  Heart is regular.  Lab Findings: Lab Results  Component Value Date   WBC 10.2 03/15/2011   HGB 13.9 03/15/2011   HCT 41.0 03/15/2011   MCV 87.0 03/15/2011   PLT 255 03/15/2011   Radiographic Findings: Dg Chest 2 View  11/10/2011  *RADIOLOGY REPORT*  Clinical Data: Shortness of breath, back pain, former smoking history  CHEST - 2 VIEW  Comparison: Chest x-ray of 03/20/2011 and CT chest of 05/02/2011  Findings: The lungs are hyperaerated consistent with COPD.  No definite active process is seen.  There is vague opacity in the periphery of the right upper lung field.  This may be due to scarring and bullous change on prior CT in this patient with marked emphysematous change.  However a developing lesion cannot be excluded and follow-up CT of the chest is recommended.  Mediastinal contours are stable.  The heart is within normal limits in size. No bony abnormality is noted.  IMPRESSION:  1.  COPD.  No definite active process. 2.  Vague opacity in the periphery of the right  upper lobe. Possible scarring but consider follow-up chest x-ray or CT of the chest to assess more sensitively.  Original Report Authenticated By: Juline Patch, M.D.    Impression:  The patient has no clinical evidence of recurrence.  Plan:  We will obtain chest CT now and follow-up in 6 months if no sign of recurrence.  _____________________________________  Artist Pais. Kathrynn Running, M.D.

## 2011-11-30 NOTE — Telephone Encounter (Signed)
Called in Ambien 5 mg. Take one tablet at bedtime as needed. Qty 30 with five refills. As directed by Dr. Kathrynn Running. Call this script to Raynelle Fanning at [facility] [phone].

## 2011-12-01 ENCOUNTER — Telehealth: Payer: Self-pay | Admitting: *Deleted

## 2011-12-01 NOTE — Telephone Encounter (Signed)
XXXX 

## 2011-12-04 ENCOUNTER — Other Ambulatory Visit (HOSPITAL_COMMUNITY): Payer: Medicare Other

## 2011-12-06 ENCOUNTER — Other Ambulatory Visit: Payer: Self-pay | Admitting: Internal Medicine

## 2011-12-06 ENCOUNTER — Ambulatory Visit (INDEPENDENT_AMBULATORY_CARE_PROVIDER_SITE_OTHER): Payer: Medicare Other

## 2011-12-06 DIAGNOSIS — J45909 Unspecified asthma, uncomplicated: Secondary | ICD-10-CM

## 2011-12-07 DIAGNOSIS — J45909 Unspecified asthma, uncomplicated: Secondary | ICD-10-CM

## 2011-12-07 MED ORDER — OMALIZUMAB 150 MG ~~LOC~~ SOLR
225.0000 mg | Freq: Once | SUBCUTANEOUS | Status: AC
  Administered 2011-12-07: 225 mg via SUBCUTANEOUS

## 2011-12-11 ENCOUNTER — Inpatient Hospital Stay (HOSPITAL_COMMUNITY): Admission: RE | Admit: 2011-12-11 | Payer: Medicare Other | Source: Ambulatory Visit

## 2011-12-13 ENCOUNTER — Ambulatory Visit (HOSPITAL_COMMUNITY)
Admission: RE | Admit: 2011-12-13 | Discharge: 2011-12-13 | Disposition: A | Discharge status: Home / Self Care | Payer: Medicare Other | Source: Ambulatory Visit | Attending: Radiation Oncology | Admitting: Radiation Oncology

## 2011-12-13 ENCOUNTER — Encounter (HOSPITAL_COMMUNITY): Payer: Self-pay

## 2011-12-13 DIAGNOSIS — C3490 Malignant neoplasm of unspecified part of unspecified bronchus or lung: Secondary | ICD-10-CM | POA: Insufficient documentation

## 2011-12-13 DIAGNOSIS — R911 Solitary pulmonary nodule: Secondary | ICD-10-CM | POA: Insufficient documentation

## 2011-12-15 ENCOUNTER — Telehealth: Payer: Self-pay | Admitting: *Deleted

## 2011-12-15 NOTE — Telephone Encounter (Signed)
Called patient per Md, results ct chest showed no definite evidence of any recurrent cancer whish is good news,Md to discuss details of the scan results when she returns for her follow up appt.patient thanked staff and Dr.Manning for the good news 10:01 AM

## 2011-12-21 NOTE — Progress Notes (Signed)
Quick Note:  Pt aware of results and next OV. ______ 

## 2012-01-05 ENCOUNTER — Ambulatory Visit: Payer: Medicare Other

## 2012-01-05 ENCOUNTER — Ambulatory Visit (INDEPENDENT_AMBULATORY_CARE_PROVIDER_SITE_OTHER): Payer: Medicare Other

## 2012-01-05 DIAGNOSIS — J309 Allergic rhinitis, unspecified: Secondary | ICD-10-CM

## 2012-01-05 DIAGNOSIS — J45909 Unspecified asthma, uncomplicated: Secondary | ICD-10-CM

## 2012-01-08 DIAGNOSIS — J45909 Unspecified asthma, uncomplicated: Secondary | ICD-10-CM

## 2012-01-08 MED ORDER — OMALIZUMAB 150 MG ~~LOC~~ SOLR
225.0000 mg | Freq: Once | SUBCUTANEOUS | Status: AC
  Administered 2012-01-08: 225 mg via SUBCUTANEOUS

## 2012-01-10 ENCOUNTER — Other Ambulatory Visit: Payer: Self-pay | Admitting: Internal Medicine

## 2012-01-16 ENCOUNTER — Encounter: Payer: Self-pay | Admitting: Internal Medicine

## 2012-01-24 ENCOUNTER — Ambulatory Visit: Payer: Medicare Other

## 2012-01-25 ENCOUNTER — Ambulatory Visit (INDEPENDENT_AMBULATORY_CARE_PROVIDER_SITE_OTHER): Payer: Medicare Other

## 2012-01-25 DIAGNOSIS — J45909 Unspecified asthma, uncomplicated: Secondary | ICD-10-CM

## 2012-01-25 MED ORDER — OMALIZUMAB 150 MG ~~LOC~~ SOLR
225.0000 mg | Freq: Once | SUBCUTANEOUS | Status: AC
  Administered 2012-01-25: 225 mg via SUBCUTANEOUS

## 2012-02-01 ENCOUNTER — Encounter: Payer: Self-pay | Admitting: Internal Medicine

## 2012-02-01 ENCOUNTER — Ambulatory Visit (INDEPENDENT_AMBULATORY_CARE_PROVIDER_SITE_OTHER): Payer: Medicare Other | Admitting: Internal Medicine

## 2012-02-01 VITALS — BP 120/76 | HR 99 | Ht 65.0 in | Wt 149.8 lb

## 2012-02-01 DIAGNOSIS — R911 Solitary pulmonary nodule: Secondary | ICD-10-CM

## 2012-02-01 DIAGNOSIS — J449 Chronic obstructive pulmonary disease, unspecified: Secondary | ICD-10-CM

## 2012-02-01 DIAGNOSIS — J984 Other disorders of lung: Secondary | ICD-10-CM

## 2012-02-01 DIAGNOSIS — J309 Allergic rhinitis, unspecified: Secondary | ICD-10-CM

## 2012-02-01 NOTE — Progress Notes (Signed)
Patient ID: [name], female    DOB: 05-03-1936, 76 y.o.   MRN: [id number]  HPI 53/12- 8 yo former smoker with severe COPD/ chronic hypoxic respiratory failure, hx lung nodule treated with XRT as presumptive Ca w/o bx. OSA  Husband here. Last here November 28, 2010. Since then was hosp for tachyarrythmia- considered for ablation. She was concerned about risk of being put to sleep and wanted to discuss it here.  Now feels sore at strap level around chest, very congested and wheezy. Nebulizer helps but needing frequently. Malaise. Sputum white, no fever. Feet feel still a little swollen. Continues prednisone 5 mg every other day.   06/02/11- 78 yo former smoker with severe COPD/ chronic hypoxic respiratory failure, hx lung nodule treated with XRT as presumptive Ca w/o bx. OSA  Doing better since last visit. Less drainage and less cough. Breathing comfortable at rest on room air but not with any exertion. Theophylline level low 03/15/11- not likely to contribute to irregular heart beat.  Continues O2, low dose prednisone and Xolair.   10/03/11-  67 yo former smoker with severe COPD/ chronic hypoxic respiratory failure, hx lung nodule treated with XRT as presumptive Ca w/o bx, OSA Husband here. More increased SOB than usual and pain in back and chest area over past 2-3 days(recently had flu) Heart rate went up to 150s an hour or two after pain started. Had to double up on ditiazem due to increased heart palpitation-CP stopped once med taken. She reported this to her cardiology office, who referred her here. Today she feels back to normal, just tired.   Sleep Apnea -Wears CPAP for approx 6-8 hours at night.  11/10/11- 30 yo former smoker with severe COPD/ chronic hypoxic respiratory failure, hx lung nodule treated with XRT as presumptive Ca w/o bx, OSA Husband here. Acute visit- C/O: pain in upper back and around sides(pain moves); consistent pain but more intense with inhaling.    02/01/12-  22 yo  former smoker with severe COPD/ chronic hypoxic respiratory failure, hx lung nodule treated with XRT as presumptive Ca w/o bx, OSA    Husband here  Has to stop and rest more than usual; has turned O2 up to 3.5 and feels better In the past 2 or 3 weeks has had increased dyspnea on exertion with no abrupt change. Continues maintenance prednisone 5 mg daily. Uses nebulizer less than once per day. Some tachycardia palpitation and has a history of SVT. There has been some discussion of an ablation procedure. She continues allergy vaccine and feels that has helped her through the spring. CT 12/14/11 reviewed images with them. IMPRESSION:  Increasing paramediastinal nodularity along the medial left upper  lobe. While this may reflect fibrosis related to prior radiation,  given the history of XRT completion in 2011, the interval change is  worrisome. PET-CT is suggested for further evaluation.  Underlying moderate centrilobular and paraseptal emphysema with  stable bullous changes in the right lung apex.  These results will be called to the ordering clinician or  representative by the Radiologist Assistant, and communication  documented in the PACS Dashboard.  Original Report Authenticated By: Charline Bills, M.D.   Review of Systems- see HPI Constitutional:   No weight loss, night sweats, fevers, chills, fatigue, lassitude. HEENT:   No headaches,  Difficulty swallowing,  Tooth/dental problems,  Sore throat,                No sneezing, itching, ear ache, nasal congestion,  post nasal drip,  CV:  + chest pain,  No-orthopnea, PND, swelling in lower extremities, anasarca, dizziness, palpitations GI  No heartburn, + indigestion,  No-abdominal pain, nausea, vomiting, diarrhea, change in bowel habits, loss of appetite Resp:No excess mucus, No coughing up of blood.  No change in color of mucus.  Skin: no rash or lesions. GU:  MS:  No joint pain or swelling. Marland Kitchen Psych:  No change in mood or affect. No  depression or anxiety.  No memory loss.    Objective:   Physical Exam General- Alert, Oriented, Affect-appropriate, Distress- none acute   O2 on 3.5 L/M Skin- rash-none, lesions- none, excoriation- none Lymphadenopathy- none Head- atraumatic            Eyes- Gross vision intact, PERRLA, conjunctivae clear secretions            Ears- Hearing, canals            Nose- Clear, Septal dev, mucus, polyps, erosion, perforation. Chronic nasal quality to speech.              Throat- Mallampati II , mucosa clear , drainage- none, tonsils- atrophic Neck- flexible , trachea midline, no stridor , thyroid nl, carotid no bruit Chest - symmetrical excursion , unlabored           Heart/CV- RRR , no murmur , no gallop  , no rub, nl s1 s2                           - JVD- none , edema- none, stasis changes- none, varices- none           Lung- dry cough, decreased sounds are quiet , dullness-none, rub- none, unlabored           Chest wall-  Abd-  Br/ Gen/ Rectal- Not done, not indicated Extrem- cyanosis- none, clubbing, none, atrophy- none, strength- nl Neuro- grossly intact to observation

## 2012-02-01 NOTE — Patient Instructions (Signed)
Order- PET scan neck to thigh   Left upper lobe mass  Ok to use oxygen 3-5 L/M as needed while awake. Try to sleep on 3L if that is comfortable.

## 2012-02-06 ENCOUNTER — Encounter: Payer: Self-pay | Admitting: Internal Medicine

## 2012-02-06 NOTE — Assessment & Plan Note (Signed)
Severe COPD with emphysema component on CT scan. She can adjust oxygen for comfort between 3 and 4 L per minute

## 2012-02-06 NOTE — Assessment & Plan Note (Signed)
This may be radiation fibrosis but recurrence of her cancer cannot be excluded. I discussed this as we reviewed the images. Plan-PET scan

## 2012-02-06 NOTE — Assessment & Plan Note (Signed)
She continues allergy vaccine and recognizes benefit. There will be some nose irritation from her oxygen.

## 2012-02-12 ENCOUNTER — Other Ambulatory Visit: Payer: Self-pay | Admitting: Radiation Oncology

## 2012-02-13 ENCOUNTER — Encounter (HOSPITAL_COMMUNITY)
Admission: RE | Admit: 2012-02-13 | Discharge: 2012-02-13 | Disposition: A | Discharge status: Home / Self Care | Payer: Medicare Other | Source: Ambulatory Visit | Attending: Internal Medicine | Admitting: Internal Medicine

## 2012-02-13 DIAGNOSIS — C3490 Malignant neoplasm of unspecified part of unspecified bronchus or lung: Secondary | ICD-10-CM | POA: Insufficient documentation

## 2012-02-13 DIAGNOSIS — I2510 Atherosclerotic heart disease of native coronary artery without angina pectoris: Secondary | ICD-10-CM | POA: Insufficient documentation

## 2012-02-13 DIAGNOSIS — K5730 Diverticulosis of large intestine without perforation or abscess without bleeding: Secondary | ICD-10-CM | POA: Insufficient documentation

## 2012-02-13 DIAGNOSIS — R918 Other nonspecific abnormal finding of lung field: Secondary | ICD-10-CM | POA: Insufficient documentation

## 2012-02-13 DIAGNOSIS — I700 Atherosclerosis of aorta: Secondary | ICD-10-CM | POA: Insufficient documentation

## 2012-02-13 DIAGNOSIS — Z90710 Acquired absence of both cervix and uterus: Secondary | ICD-10-CM | POA: Insufficient documentation

## 2012-02-13 DIAGNOSIS — R911 Solitary pulmonary nodule: Secondary | ICD-10-CM

## 2012-02-13 LAB — GLUCOSE, CAPILLARY: Glucose-Capillary: 111 mg/dL — ABNORMAL HIGH (ref 70–99)

## 2012-02-13 MED ORDER — FLUDEOXYGLUCOSE F - 18 (FDG) INJECTION
17.1000 | Freq: Once | INTRAVENOUS | Status: AC | PRN
  Administered 2012-02-13: 17.1 via INTRAVENOUS

## 2012-02-16 ENCOUNTER — Ambulatory Visit (INDEPENDENT_AMBULATORY_CARE_PROVIDER_SITE_OTHER): Payer: Medicare Other

## 2012-02-16 DIAGNOSIS — J45909 Unspecified asthma, uncomplicated: Secondary | ICD-10-CM

## 2012-02-16 DIAGNOSIS — J309 Allergic rhinitis, unspecified: Secondary | ICD-10-CM

## 2012-02-19 MED ORDER — OMALIZUMAB 150 MG ~~LOC~~ SOLR
225.0000 mg | Freq: Once | SUBCUTANEOUS | Status: AC
  Administered 2012-02-19: 225 mg via SUBCUTANEOUS

## 2012-02-22 ENCOUNTER — Other Ambulatory Visit: Payer: Self-pay | Admitting: Radiation Oncology

## 2012-02-22 ENCOUNTER — Encounter: Payer: Self-pay | Admitting: Radiation Oncology

## 2012-02-22 DIAGNOSIS — C3490 Malignant neoplasm of unspecified part of unspecified bronchus or lung: Secondary | ICD-10-CM

## 2012-02-22 NOTE — Progress Notes (Signed)
  Radiation Oncology         [phone] ________________________________  Name: [name]             MRN: [id number]  Date: 02/22/2012  DOB: [birth year]  Telephone contact:  I received a telephone call from the patient's husband asking about her recent PET/CT.  I was not aware of the test but the patient did have a PET CT performed on May 28 as a result of some and decreasing nodularity at the site of a treated left upper lung nodule. Her CTs have shown increasing nodularity density and fibrotic change in this area suspicious for possible tumor recurrence. The PET CT confirmed this increasing density also showed mildly avid tracer uptake with an SUV of 4.4. Fortunately, PET/CT did not demonstrate any mediastinal lymphadenopathy or extracranial metastatic disease.  The PET/CT report did not comment on her history of stereotactic body radiotherapy and the potential for mass like fibrosis forming within the SB RT treatment volume which could be mildly metabolically avid.  The SUV of 4.4 certainly could fall into the range of post radiation effects like MLF.  I talked to the patient's husband about this potential. We talked about the role of CT guided biopsy to distinguish between the 2 processes. However, it is clear that the patient is at very high risk for CT-guided biopsy or any potential interventions whatsoever. Based on that, I would favor conservative approach of repeating her chest CT in approximately 3 months.  ________________________________  Artist Pais. Kathrynn Running, M.D.

## 2012-02-23 ENCOUNTER — Telehealth: Payer: Self-pay | Admitting: *Deleted

## 2012-02-23 NOTE — Telephone Encounter (Signed)
CALLED PATIENT TO INFORM OF TEST, LINE BUSY WILL CALL LATER

## 2012-02-23 NOTE — Telephone Encounter (Signed)
CALLED PATIENT TO INFORM OF TEST FOR 05-30-12 AT 1:00 PM AT [facility], SPOKE WITH PATIENT AND SHE IS AWARE OF THIS TEST AND HER FU VISIT WITH DR. MANNING

## 2012-03-07 ENCOUNTER — Ambulatory Visit (INDEPENDENT_AMBULATORY_CARE_PROVIDER_SITE_OTHER): Payer: Medicare Other

## 2012-03-07 DIAGNOSIS — J45909 Unspecified asthma, uncomplicated: Secondary | ICD-10-CM

## 2012-03-08 MED ORDER — OMALIZUMAB 150 MG ~~LOC~~ SOLR
225.0000 mg | Freq: Once | SUBCUTANEOUS | Status: AC
  Administered 2012-03-08: 225 mg via SUBCUTANEOUS

## 2012-03-11 ENCOUNTER — Other Ambulatory Visit: Payer: Self-pay | Admitting: Internal Medicine

## 2012-03-20 ENCOUNTER — Ambulatory Visit (INDEPENDENT_AMBULATORY_CARE_PROVIDER_SITE_OTHER): Payer: Medicare Other

## 2012-03-20 DIAGNOSIS — J309 Allergic rhinitis, unspecified: Secondary | ICD-10-CM

## 2012-03-20 DIAGNOSIS — J45909 Unspecified asthma, uncomplicated: Secondary | ICD-10-CM

## 2012-03-22 MED ORDER — OMALIZUMAB 150 MG ~~LOC~~ SOLR
225.0000 mg | Freq: Once | SUBCUTANEOUS | Status: AC
  Administered 2012-03-22: 225 mg via SUBCUTANEOUS

## 2012-04-03 ENCOUNTER — Ambulatory Visit (INDEPENDENT_AMBULATORY_CARE_PROVIDER_SITE_OTHER): Payer: Medicare Other

## 2012-04-03 DIAGNOSIS — J45909 Unspecified asthma, uncomplicated: Secondary | ICD-10-CM

## 2012-04-03 DIAGNOSIS — J309 Allergic rhinitis, unspecified: Secondary | ICD-10-CM

## 2012-04-04 MED ORDER — OMALIZUMAB 150 MG ~~LOC~~ SOLR
225.0000 mg | Freq: Once | SUBCUTANEOUS | Status: AC
  Administered 2012-04-04: 225 mg via SUBCUTANEOUS

## 2012-05-09 ENCOUNTER — Encounter: Payer: Self-pay | Admitting: Internal Medicine

## 2012-05-09 ENCOUNTER — Ambulatory Visit (INDEPENDENT_AMBULATORY_CARE_PROVIDER_SITE_OTHER): Payer: Medicare Other | Admitting: Internal Medicine

## 2012-05-09 ENCOUNTER — Ambulatory Visit (INDEPENDENT_AMBULATORY_CARE_PROVIDER_SITE_OTHER): Payer: Medicare Other

## 2012-05-09 ENCOUNTER — Telehealth: Payer: Self-pay | Admitting: Internal Medicine

## 2012-05-09 ENCOUNTER — Other Ambulatory Visit: Payer: Medicare Other

## 2012-05-09 VITALS — BP 122/64 | HR 96 | Ht 65.0 in | Wt 150.0 lb

## 2012-05-09 DIAGNOSIS — J449 Chronic obstructive pulmonary disease, unspecified: Secondary | ICD-10-CM

## 2012-05-09 DIAGNOSIS — J984 Other disorders of lung: Secondary | ICD-10-CM

## 2012-05-09 DIAGNOSIS — J309 Allergic rhinitis, unspecified: Secondary | ICD-10-CM

## 2012-05-09 DIAGNOSIS — I498 Other specified cardiac arrhythmias: Secondary | ICD-10-CM

## 2012-05-09 DIAGNOSIS — I471 Supraventricular tachycardia: Secondary | ICD-10-CM

## 2012-05-09 DIAGNOSIS — J069 Acute upper respiratory infection, unspecified: Secondary | ICD-10-CM

## 2012-05-09 DIAGNOSIS — J45909 Unspecified asthma, uncomplicated: Secondary | ICD-10-CM

## 2012-05-09 MED ORDER — DOXYCYCLINE HYCLATE 100 MG PO TABS: ORAL_TABLET | ORAL | Status: DC

## 2012-05-09 MED ORDER — METHYLPREDNISOLONE ACETATE 80 MG/ML IJ SUSP
80.0000 mg | Freq: Once | INTRAMUSCULAR | Status: AC
  Administered 2012-05-09: 80 mg via INTRAMUSCULAR

## 2012-05-09 MED ORDER — OMALIZUMAB 150 MG ~~LOC~~ SOLR
225.0000 mg | Freq: Once | SUBCUTANEOUS | Status: AC
  Administered 2012-05-09: 225 mg via SUBCUTANEOUS

## 2012-05-09 MED ORDER — PHENYLEPHRINE HCL 1 % NA SOLN
3.0000 [drp] | Freq: Once | NASAL | Status: AC
  Administered 2012-05-09: 3 [drp] via NASAL

## 2012-05-09 NOTE — Progress Notes (Signed)
Patient ID: [name], female    DOB: 1935/12/10, 76 y.o.   MRN: [id number]  HPI 53/12- 89 yo former smoker with severe COPD/ chronic hypoxic respiratory failure, hx lung nodule treated with XRT as presumptive Ca w/o bx. OSA  Husband here. Last here November 28, 2010. Since then was hosp for tachyarrythmia- considered for ablation. She was concerned about risk of being put to sleep and wanted to discuss it here.  Now feels sore at strap level around chest, very congested and wheezy. Nebulizer helps but needing frequently. Malaise. Sputum white, no fever. Feet feel still a little swollen. Continues prednisone 5 mg every other day.   06/02/11- 11 yo former smoker with severe COPD/ chronic hypoxic respiratory failure, hx lung nodule treated with XRT as presumptive Ca w/o bx. OSA  Doing better since last visit. Less drainage and less cough. Breathing comfortable at rest on room air but not with any exertion. Theophylline level low 03/15/11- not likely to contribute to irregular heart beat.  Continues O2, low dose prednisone and Xolair.   10/03/11-  57 yo former smoker with severe COPD/ chronic hypoxic respiratory failure, hx lung nodule treated with XRT as presumptive Ca w/o bx, OSA Husband here. More increased SOB than usual and pain in back and chest area over past 2-3 days(recently had flu) Heart rate went up to 150s an hour or two after pain started. Had to double up on ditiazem due to increased heart palpitation-CP stopped once med taken. She reported this to her cardiology office, who referred her here. Today she feels back to normal, just tired.   Sleep Apnea -Wears CPAP for approx 6-8 hours at night.  11/10/11- 51 yo former smoker with severe COPD/ chronic hypoxic respiratory failure, hx lung nodule treated with XRT as presumptive Ca w/o bx, OSA Husband here. Acute visit- C/O: pain in upper back and around sides(pain moves); consistent pain but more intense with inhaling.    02/01/12-  10 yo  former smoker with severe COPD/ chronic hypoxic respiratory failure, hx lung nodule treated with XRT as presumptive Ca w/o bx, OSA    Husband here  Has to stop and rest more than usual; has turned O2 up to 3.5 and feels better In the past 2 or 3 weeks has had increased dyspnea on exertion with no abrupt change. Continues maintenance prednisone 5 mg daily. Uses nebulizer less than once per day. Some tachycardia palpitation and has a history of SVT. There has been some discussion of an ablation procedure. She continues allergy vaccine and feels that has helped her through the spring. CT 12/14/11 reviewed images with them. IMPRESSION:  Increasing paramediastinal nodularity along the medial left upper  lobe. While this may reflect fibrosis related to prior radiation,  given the history of XRT completion in 2011, the interval change is  worrisome. PET-CT is suggested for further evaluation.  Underlying moderate centrilobular and paraseptal emphysema with  stable bullous changes in the right lung apex.  These results will be called to the ordering clinician or  representative by the Radiologist Assistant, and communication  documented in the PACS Dashboard.  Original Report Authenticated By: Charline Bills, M.D.    05/09/12- 9 yo former smoker with severe COPD/ chronic hypoxic respiratory failure, hx lung nodule treated with XRT as presumptive Ca w/o bx, OSA ., Hx SVT    Recent acute illness: Slight fever, SOB, and cough. Has sinus flare up , with nausea, myalgias, nasal congestion and watery rhinorrhea. Sister  had similar illness. Still on prednisone 5 mg daily and oxygen 3.5 L/Advanced.  History of SVT and notices occasional tachycardia palpitation. Nodular prominence left upper lobe suspicious for tumor recurrence. She has followup chest CT and appointment pending with Dr. Kathrynn Running Rad.Onc. OSA/ CPAP doing well with no changes or concerns. PET 02/13/12- images reviewed with her IMPRESSION:    Continued progression of abnormal soft tissue/nodularity in the  medial left upper lung, now measuring approximately 2.6 x 1.5 cm,  mildly FDG avid with max SUV 4.4. This appearance is suspicious  for tumor recurrence.  No evidence of metastatic disease.  Original Report Authenticated By: Charline Bills, M.D.   Review of Systems- see HPI Constitutional:   No weight loss, night sweats, fevers, chills, fatigue, lassitude. HEENT:   No headaches,  Difficulty swallowing,  Tooth/dental problems,  Sore throat,                No sneezing, itching, ear ache, +nasal congestion, +post nasal drip,  CV:  No acute chest pain,  No-orthopnea, PND, swelling in lower extremities, anasarca, dizziness, palpitations GI  No heartburn, + indigestion,  No-abdominal pain, nausea, vomiting,  Resp:No excess mucus, No coughing up of blood.  No change in color of mucus.  Skin: no rash or lesions. GU:  MS:  No joint pain or swelling. Marland Kitchen Psych:  No change in mood or affect. No depression or anxiety.  No memory loss.    Objective:   Physical Exam BP 122/64  Pulse 96  Ht 5\' 5"  (1.651 m)  Wt 150 lb (68.04 kg)  BMI 24.96 kg/m2  SpO2 96%  General- Alert, Oriented, Affect-appropriate, Distress- none acute   O2 on 3.5 L/M Skin- rash-none, lesions- none, excoriation- none Lymphadenopathy- none Head- atraumatic            Eyes- Gross vision intact, PERRLA, conjunctivae clear secretions            Ears- Hearing, canals            Nose- + mucoid bridging, Septal dev,  polyps, erosion, perforation. Chronic nasal quality to speech.              Throat- Mallampati II , mucosa clear , drainage- none, tonsils- atrophic Neck- flexible , trachea midline, no stridor , thyroid nl, carotid no bruit Chest - symmetrical excursion , unlabored           Heart/CV- RRR , no murmur , no gallop  , no rub, nl s1 s2                           - JVD- none , edema- none, stasis changes- none, varices- none           Lung- dry cough,  decreased sounds are quiet , dullness-none, rub- none, unlabored           Chest wall-  Abd-  Br/ Gen/ Rectal- Not done, not indicated Extrem- cyanosis- none, clubbing, none, atrophy- none, strength- nl Neuro- grossly intact to observation

## 2012-05-09 NOTE — Patient Instructions (Addendum)
Script sent for doxycycline antibiotic  Next week, when you get your INR blood test checked, tell them that you have been on doxycycline.  Order- lab- theophylline level   Dx COPD  Neb neo nasal    Allergic rhinitis, URI  Depo 80

## 2012-05-09 NOTE — Telephone Encounter (Signed)
[email or web address]  

## 2012-05-10 LAB — THEOPHYLLINE LEVEL: Theophylline Lvl: 5.1 ug/mL — ABNORMAL LOW (ref 10.0–20.0)

## 2012-05-10 NOTE — Telephone Encounter (Signed)
Pt was seen yesterday.  [name]

## 2012-05-10 NOTE — Progress Notes (Signed)
Quick Note:  Pt aware of results. ______ 

## 2012-05-10 NOTE — Progress Notes (Signed)
Quick Note:  LMTCB ______ 

## 2012-05-15 ENCOUNTER — Ambulatory Visit: Payer: Medicare Other

## 2012-05-16 NOTE — Assessment & Plan Note (Signed)
Severe COPD with chronic hypoxic respiratory failure. She will continue present treatment.

## 2012-05-16 NOTE — Assessment & Plan Note (Signed)
PET scan supports impression this is likely relapse of her primary cancer. She will followup with radiation oncology/Dr. Kathrynn Running

## 2012-05-16 NOTE — Assessment & Plan Note (Signed)
Occasional tachycardia palpitation. -Plan theophylline level

## 2012-05-16 NOTE — Assessment & Plan Note (Signed)
She continues allergy vaccine and feels it helps her. Recent acute illness was more likely a viral upper respiratory infection with bronchitis Plan-nasal nebulizer decongestant, Depo-Medrol, doxycycline(with warfarin discussion).

## 2012-05-23 ENCOUNTER — Ambulatory Visit: Payer: Medicare Other

## 2012-05-30 ENCOUNTER — Ambulatory Visit (HOSPITAL_COMMUNITY): Admission: RE | Admit: 2012-05-30 | Payer: Medicare Other | Source: Ambulatory Visit

## 2012-05-30 ENCOUNTER — Ambulatory Visit (HOSPITAL_COMMUNITY)
Admission: RE | Admit: 2012-05-30 | Discharge: 2012-05-30 | Disposition: A | Discharge status: Home / Self Care | Payer: Medicare Other | Source: Ambulatory Visit | Attending: Radiation Oncology | Admitting: Radiation Oncology

## 2012-05-30 DIAGNOSIS — M948X9 Other specified disorders of cartilage, unspecified sites: Secondary | ICD-10-CM | POA: Insufficient documentation

## 2012-05-30 DIAGNOSIS — C3490 Malignant neoplasm of unspecified part of unspecified bronchus or lung: Secondary | ICD-10-CM | POA: Insufficient documentation

## 2012-05-30 DIAGNOSIS — I700 Atherosclerosis of aorta: Secondary | ICD-10-CM | POA: Insufficient documentation

## 2012-05-30 DIAGNOSIS — I2510 Atherosclerotic heart disease of native coronary artery without angina pectoris: Secondary | ICD-10-CM | POA: Insufficient documentation

## 2012-05-30 DIAGNOSIS — N289 Disorder of kidney and ureter, unspecified: Secondary | ICD-10-CM | POA: Insufficient documentation

## 2012-06-03 ENCOUNTER — Other Ambulatory Visit: Payer: Self-pay | Admitting: Internal Medicine

## 2012-06-03 ENCOUNTER — Ambulatory Visit (INDEPENDENT_AMBULATORY_CARE_PROVIDER_SITE_OTHER): Payer: Medicare Other

## 2012-06-03 DIAGNOSIS — J45909 Unspecified asthma, uncomplicated: Secondary | ICD-10-CM

## 2012-06-03 DIAGNOSIS — J309 Allergic rhinitis, unspecified: Secondary | ICD-10-CM

## 2012-06-03 MED ORDER — OMALIZUMAB 150 MG ~~LOC~~ SOLR
225.0000 mg | Freq: Once | SUBCUTANEOUS | Status: AC
  Administered 2012-06-03: 225 mg via SUBCUTANEOUS

## 2012-06-06 ENCOUNTER — Ambulatory Visit
Admission: RE | Admit: 2012-06-06 | Discharge: 2012-06-06 | Disposition: A | Discharge status: Home / Self Care | Payer: Medicare Other | Source: Ambulatory Visit | Attending: Radiation Oncology | Admitting: Radiation Oncology

## 2012-06-06 ENCOUNTER — Encounter: Payer: Self-pay | Admitting: Radiation Oncology

## 2012-06-06 VITALS — Wt 154.0 lb

## 2012-06-06 DIAGNOSIS — J984 Other disorders of lung: Secondary | ICD-10-CM

## 2012-06-06 DIAGNOSIS — C3490 Malignant neoplasm of unspecified part of unspecified bronchus or lung: Secondary | ICD-10-CM

## 2012-06-06 DIAGNOSIS — R0602 Shortness of breath: Secondary | ICD-10-CM

## 2012-06-06 NOTE — Progress Notes (Signed)
Pt c/o pain 6/10 in left shoulder w/movement due to falling this morning. She has bruise on inner aspect of middle finger on right hand and left knee. Pt states she "tripped over her oxygen cord, landed on my shoulder with chin tucked to chest". She states she plans to see her PCP, Dr Maple Hudson after her appt in Rad Onc today. She took Tylenol ES 30 min ago, and she states pain "is easing off some". Pt denies other pain, has occasional prod cough w/white phlegm. Continuous O2 @ 3L/min, occasionally uses O2 @ 3.5L/min.

## 2012-06-06 NOTE — Progress Notes (Signed)
Radiation Oncology         [phone] ________________________________  Name: [name] MRN: [id number]  Date: 06/06/2012  DOB: 02-21-1936  Follow-Up Visit Note  CC: Georgann Housekeeper, MD  Waymon Budge, MD  Diagnosis:   This is a 76 year old woman status post stereotactic body radiotherapy for clinical stage I non-small-cell carcinoma of the lung.  Interval Since Last Radiation:  28 months  Narrative:  The patient returns today for routine follow-up.  She continues to several with baseline dyspnea which is essentially unchanged. She a followup CT scan described below.                              ALLERGIES:  is allergic to clarithromycin.  Meds: Current Outpatient Prescriptions  Medication Sig Dispense Refill  . ADVAIR DISKUS 250-50 MCG/DOSE AEPB inhale 1 dose by mouth twice a day  60 each  2  . Albuterol Sulfate (PROAIR HFA IN) Inhale into the lungs. AS directed       . buPROPion (WELLBUTRIN SR) 150 MG 12 hr tablet Take 150 mg by mouth 2 (two) times daily.        Marland Kitchen diltiazem (CARDIZEM CD) 240 MG 24 hr capsule Take 240 mg by mouth 2 (two) times daily.        Marland Kitchen doxycycline (VIBRA-TABS) 100 MG tablet 2 today then one daily  8 tablet  0  . EPINEPHrine (EPIPEN JR) 0.15 MG/0.3ML injection Inject 0.15 mg into the muscle as needed.        Marland Kitchen estrogens, conjugated, (PREMARIN) 0.625 MG tablet Take 0.625 mg by mouth daily. Take daily for 21 days then do not take for 7 days.       . fluticasone (FLONASE) 50 MCG/ACT nasal spray Place 2 sprays into the nose daily.  1 g  11  . guaiFENesin (MUCINEX) 600 MG 12 hr tablet Take 1,200 mg by mouth as needed.        . Ipratropium-Albuterol (DUONEB IN) As directed       . levothyroxine (SYNTHROID, LEVOTHROID) 75 MCG tablet Take 75 mcg by mouth daily.        . montelukast (SINGULAIR) 10 MG tablet take 1 tablet by mouth once daily  30 tablet  6  . NON FORMULARY Allergy Vaccine 1:10 GH       . NON FORMULARY CPAP       . NON FORMULARY Oxygen 2-3 LPM        . omalizumab (XOLAIR) 150 MG injection Inject into the skin. 225mg  IM every 2 weeks       . predniSONE (DELTASONE) 5 MG tablet TAKE 1 TABLET BY MOUTH DAILY OR AS DIRECTED  100 tablet  0  . risedronate (ACTONEL) 35 MG tablet Take 35 mg by mouth every 7 (seven) days. with water on empty stomach, nothing by mouth or lie down for next 30 minutes.       . theophylline (THEODUR) 300 MG 12 hr tablet take 1/2 tablet by mouth twice a day  30 tablet  5  . Tiotropium Bromide Monohydrate (SPIRIVA HANDIHALER IN) Inhale into the lungs. daily       . traMADol (ULTRAM) 50 MG tablet Take 1 tablet by mouth 4 (four) times daily as needed.      . warfarin (COUMADIN) 5 MG tablet Take 1 mg by mouth daily. As directed, now takes 2 tabs 1mg  at night except wednesdays, then takes 3 tabs of  1mg  coumadin at night      . zolpidem (AMBIEN) 5 MG tablet Take 1 tablet (5 mg total) by mouth at bedtime as needed for sleep.  30 tablet  5    Physical Findings: The patient is in no acute distress. Patient is alert and oriented.  weight is 154 lb (69.854 kg). .  No significant changes.  Lab Findings: Lab Results  Component Value Date   WBC 10.2 03/15/2011   HGB 13.9 03/15/2011   HCT 41.0 03/15/2011   MCV 87.0 03/15/2011   PLT 255 03/15/2011    @LASTCHEM @  Radiographic Findings: Ct Chest Wo Contrast  05/30/2012  *RADIOLOGY REPORT*  Clinical Data: Lung cancer status post SBRT.  Restaging scan.  CT CHEST WITHOUT CONTRAST  Technique:  Multidetector CT imaging of the chest was performed following the standard protocol without IV contrast.  Comparison: PET CT 02/13/2012.  Chest CT 12/13/2011.  Findings:  Mediastinum: Heart size is normal. There is no significant pericardial fluid, thickening or pericardial calcification. There is atherosclerosis of the thoracic aorta, the great vessels of the mediastinum and the coronary arteries, including calcified atherosclerotic plaque in the left main and left circumflex coronary arteries. No  pathologically enlarged mediastinal or hilar lymph nodes. Please note that accurate exclusion of hilar adenopathy is limited on noncontrast CT scans.  The esophagus is unremarkable in appearance.  Lungs/Pleura:  Over the prior several examinations there has been progressive architectural distortion in the medial aspect of the left apex where there is again some focal pleural based soft tissue thickening.  At this time, there appears to be some developing calcifications within this region of soft tissue thickening.  The appearance of this region is nonspecific, and is similar in size to prior examinations measuring up to 2.3 x 1.5 cm; it is uncertain whether not this represents local recurrence of disease, or simply progressive nodular pleural-based fibrosis and scarring.  Scarring in the inferior segment of the lingula is unchanged.  No other definite new or enlarging suspicious appearing pulmonary nodules or masses identified.  Linear opacity in the periphery of the right lower lobe is most compatible with scarring.  Background of moderate - severe centrilobular and paraseptal emphysema with right apical bullous changes and right apical architectural distortion and pleural parenchymal scarring is similar to prior examinations. No acute consolidative airspace disease.  No pleural effusions.  Upper Abdomen: 1.8 cm ow-attenuation lesion in the lateral aspect of the interpolar region of the left kidney is not characterized on this noncontrast CT examination, but is similar to prior examinations and likely to represent a cyst.  Atherosclerosis.  Musculoskeletal: Old compression fractures at T2, T3 and T12 are unchanged.  Sclerosis of T2 and T3 may suggest some post radiation osteonecrosis (unchanged).There are no aggressive appearing lytic or blastic lesions noted in the visualized portions of the skeleton.  IMPRESSION: 1.  The irregular shaped nodular pleural-based opacity in the apex of the left upper lobe is very  similar to recent prior studies, measuring approximately 2.3 x 1.5 cm on today's examination.  Given the relative stability in size over the prior 28-month time interval, this is favored to represent evolving nodular appearing postradiation fibrosis, rather than a true local recurrence. However, continued attention on follow-up studies is recommended. No new pulmonary nodules, and no evidence of mediastinal or hilar lymphadenopathy is identified on today's examination. 2.  Other incidental findings, as above.   Original Report Authenticated By: Florencia Reasons, M.D.     Impression:  The patient is recovering from the effects of radiation.  She nodular soft tissue changes in the left upper lung medially which now show some central calcifications. While the scar present persistent/recurrent tumor, it also represent evolving radiation fibrosis. It warrants continued observation period  Plan:  The patient will undergo chest CT in 3 months and see me a few days later to review the results.  _____________________________________  Artist Pais Kathrynn Running, M.D.

## 2012-06-10 ENCOUNTER — Encounter (HOSPITAL_COMMUNITY): Payer: Self-pay | Admitting: Family Medicine

## 2012-06-10 ENCOUNTER — Telehealth: Payer: Self-pay | Admitting: *Deleted

## 2012-06-10 ENCOUNTER — Inpatient Hospital Stay (HOSPITAL_COMMUNITY)
Admission: EM | Admit: 2012-06-10 | Discharge: 2012-06-14 | DRG: 251 | Disposition: A | Discharge status: Home / Self Care | Payer: Medicare Other | Attending: Interventional Cardiology | Admitting: Interventional Cardiology

## 2012-06-10 ENCOUNTER — Emergency Department (HOSPITAL_COMMUNITY): Payer: Medicare Other

## 2012-06-10 DIAGNOSIS — I4892 Unspecified atrial flutter: Principal | ICD-10-CM | POA: Diagnosis present

## 2012-06-10 DIAGNOSIS — J439 Emphysema, unspecified: Secondary | ICD-10-CM | POA: Diagnosis present

## 2012-06-10 DIAGNOSIS — I498 Other specified cardiac arrhythmias: Secondary | ICD-10-CM | POA: Diagnosis present

## 2012-06-10 DIAGNOSIS — J9610 Chronic respiratory failure, unspecified whether with hypoxia or hypercapnia: Secondary | ICD-10-CM | POA: Diagnosis present

## 2012-06-10 DIAGNOSIS — G4733 Obstructive sleep apnea (adult) (pediatric): Secondary | ICD-10-CM | POA: Diagnosis present

## 2012-06-10 DIAGNOSIS — J4489 Other specified chronic obstructive pulmonary disease: Secondary | ICD-10-CM | POA: Diagnosis present

## 2012-06-10 DIAGNOSIS — E039 Hypothyroidism, unspecified: Secondary | ICD-10-CM | POA: Diagnosis present

## 2012-06-10 DIAGNOSIS — I471 Supraventricular tachycardia, unspecified: Secondary | ICD-10-CM | POA: Diagnosis present

## 2012-06-10 DIAGNOSIS — Z85118 Personal history of other malignant neoplasm of bronchus and lung: Secondary | ICD-10-CM

## 2012-06-10 DIAGNOSIS — Z7901 Long term (current) use of anticoagulants: Secondary | ICD-10-CM

## 2012-06-10 DIAGNOSIS — J449 Chronic obstructive pulmonary disease, unspecified: Secondary | ICD-10-CM | POA: Diagnosis present

## 2012-06-10 DIAGNOSIS — Z87891 Personal history of nicotine dependence: Secondary | ICD-10-CM

## 2012-06-10 DIAGNOSIS — I4891 Unspecified atrial fibrillation: Secondary | ICD-10-CM | POA: Diagnosis present

## 2012-06-10 DIAGNOSIS — Z9981 Dependence on supplemental oxygen: Secondary | ICD-10-CM

## 2012-06-10 DIAGNOSIS — K219 Gastro-esophageal reflux disease without esophagitis: Secondary | ICD-10-CM | POA: Diagnosis present

## 2012-06-10 LAB — CBC WITH DIFFERENTIAL/PLATELET
Basophils Absolute: 0 10*3/uL (ref 0.0–0.1)
Lymphocytes Relative: 10 % — ABNORMAL LOW (ref 12–46)
Lymphs Abs: 1 10*3/uL (ref 0.7–4.0)
Neutrophils Relative %: 82 % — ABNORMAL HIGH (ref 43–77)
Platelets: 284 10*3/uL (ref 150–400)
RBC: 4.8 MIL/uL (ref 3.87–5.11)
RDW: 13.3 % (ref 11.5–15.5)
WBC: 10.9 10*3/uL — ABNORMAL HIGH (ref 4.0–10.5)

## 2012-06-10 LAB — BASIC METABOLIC PANEL
BUN: 20 mg/dL (ref 6–23)
CO2: 27 mEq/L (ref 19–32)
Calcium: 10.1 mg/dL (ref 8.4–10.5)
Chloride: 99 mEq/L (ref 96–112)
GFR calc Af Amer: 61 mL/min — ABNORMAL LOW (ref >90)
GFR calc non Af Amer: 53 mL/min — ABNORMAL LOW (ref >90)
Glucose, Bld: 125 mg/dL — ABNORMAL HIGH (ref 70–99)
Potassium: 3.9 mEq/L (ref 3.5–5.1)
Sodium: 140 mEq/L (ref 135–145)

## 2012-06-10 LAB — COMPREHENSIVE METABOLIC PANEL
ALT: 17 U/L (ref 0–35)
Alkaline Phosphatase: 93 U/L (ref 39–117)
CO2: 24 mEq/L (ref 19–32)
Calcium: 9.2 mg/dL (ref 8.4–10.5)
GFR calc Af Amer: 74 mL/min — ABNORMAL LOW (ref >90)
GFR calc non Af Amer: 64 mL/min — ABNORMAL LOW (ref >90)
Glucose, Bld: 133 mg/dL — ABNORMAL HIGH (ref 70–99)
Sodium: 137 mEq/L (ref 135–145)
Total Bilirubin: 0.3 mg/dL (ref 0.3–1.2)

## 2012-06-10 LAB — PROTIME-INR
INR: 1.89 — ABNORMAL HIGH (ref 0.00–1.49)
Prothrombin Time: 21 seconds — ABNORMAL HIGH (ref 11.6–15.2)

## 2012-06-10 MED ORDER — ONDANSETRON HCL 4 MG/2ML IJ SOLN: 4.0000 mg | Freq: Four times a day (QID) | INTRAMUSCULAR | Status: DC | PRN

## 2012-06-10 MED ORDER — TRAMADOL HCL 50 MG PO TABS
50.0000 mg | ORAL_TABLET | Freq: Four times a day (QID) | ORAL | Status: DC | PRN
  Administered 2012-06-10 – 2012-06-14 (×8): 50 mg via ORAL
  Filled 2012-06-10 (×7): qty 1

## 2012-06-10 MED ORDER — SODIUM CHLORIDE 0.9 % IJ SOLN
3.0000 mL | Freq: Two times a day (BID) | INTRAMUSCULAR | Status: DC
  Administered 2012-06-10 – 2012-06-12 (×4): 3 mL via INTRAVENOUS

## 2012-06-10 MED ORDER — NITROGLYCERIN 0.4 MG SL SUBL: 0.4000 mg | SUBLINGUAL_TABLET | SUBLINGUAL | Status: DC | PRN

## 2012-06-10 MED ORDER — SODIUM CHLORIDE 0.9 % IV SOLN
250.0000 mL | INTRAVENOUS | Status: DC | PRN
  Administered 2012-06-10: 10 mL/h via INTRAVENOUS

## 2012-06-10 MED ORDER — FLUTICASONE-SALMETEROL 250-50 MCG/DOSE IN AEPB
1.0000 | INHALATION_SPRAY | Freq: Two times a day (BID) | RESPIRATORY_TRACT | Status: DC
  Administered 2012-06-10 – 2012-06-14 (×6): 1 via RESPIRATORY_TRACT
  Filled 2012-06-10: qty 14

## 2012-06-10 MED ORDER — WARFARIN - PHARMACIST DOSING INPATIENT: Freq: Every day | Status: DC

## 2012-06-10 MED ORDER — DILTIAZEM HCL 100 MG IV SOLR
5.0000 mg/h | INTRAVENOUS | Status: DC
  Administered 2012-06-10: 10 mg/h via INTRAVENOUS
  Administered 2012-06-10: 5 mg/h via INTRAVENOUS
  Administered 2012-06-11: 15 mg/h via INTRAVENOUS
  Filled 2012-06-10: qty 100

## 2012-06-10 MED ORDER — SODIUM CHLORIDE 0.9 % IJ SOLN: 3.0000 mL | INTRAMUSCULAR | Status: DC | PRN

## 2012-06-10 MED ORDER — WARFARIN SODIUM 3 MG PO TABS
3.0000 mg | ORAL_TABLET | Freq: Once | ORAL | Status: AC
  Administered 2012-06-10: 3 mg via ORAL
  Filled 2012-06-10: qty 1

## 2012-06-10 MED ORDER — DILTIAZEM HCL 50 MG/10ML IV SOLN
20.0000 mg | Freq: Once | INTRAVENOUS | Status: AC
  Administered 2012-06-10: 20 mg via INTRAVENOUS

## 2012-06-10 MED ORDER — DILTIAZEM HCL 50 MG/10ML IV SOLN
20.0000 mg | Freq: Once | INTRAVENOUS | Status: AC
  Administered 2012-06-10: 20 mg via INTRAVENOUS
  Filled 2012-06-10: qty 4

## 2012-06-10 MED ORDER — ESTROGENS CONJUGATED 0.625 MG PO TABS
0.6250 mg | ORAL_TABLET | Freq: Every day | ORAL | Status: DC
  Administered 2012-06-11 – 2012-06-14 (×4): 0.625 mg via ORAL
  Filled 2012-06-10 (×5): qty 1

## 2012-06-10 MED ORDER — BUPROPION HCL ER (SR) 150 MG PO TB12
150.0000 mg | ORAL_TABLET | Freq: Two times a day (BID) | ORAL | Status: DC
  Administered 2012-06-10 – 2012-06-14 (×8): 150 mg via ORAL
  Filled 2012-06-10 (×10): qty 1

## 2012-06-10 MED ORDER — FLUTICASONE PROPIONATE 50 MCG/ACT NA SUSP
2.0000 | Freq: Every day | NASAL | Status: DC
  Administered 2012-06-11 – 2012-06-14 (×2): 2 via NASAL
  Filled 2012-06-10: qty 16

## 2012-06-10 MED ORDER — HEPARIN (PORCINE) IN NACL 100-0.45 UNIT/ML-% IJ SOLN
1200.0000 [IU]/h | INTRAMUSCULAR | Status: DC
  Administered 2012-06-10: 1000 [IU]/h via INTRAVENOUS
  Filled 2012-06-10 (×2): qty 250

## 2012-06-10 MED ORDER — TIOTROPIUM BROMIDE MONOHYDRATE 18 MCG IN CAPS
18.0000 ug | ORAL_CAPSULE | Freq: Every day | RESPIRATORY_TRACT | Status: DC
  Administered 2012-06-12 – 2012-06-14 (×2): 18 ug via RESPIRATORY_TRACT
  Filled 2012-06-10 (×2): qty 5

## 2012-06-10 MED ORDER — ACETAMINOPHEN 325 MG PO TABS
650.0000 mg | ORAL_TABLET | ORAL | Status: DC | PRN
  Administered 2012-06-12: 650 mg via ORAL
  Filled 2012-06-10: qty 2

## 2012-06-10 MED ORDER — THEOPHYLLINE ER 300 MG PO TB12
150.0000 mg | ORAL_TABLET | Freq: Two times a day (BID) | ORAL | Status: DC
  Administered 2012-06-11 – 2012-06-14 (×8): 150 mg via ORAL
  Filled 2012-06-10 (×10): qty 1

## 2012-06-10 MED ORDER — SODIUM CHLORIDE 0.9 % IV SOLN
INTRAVENOUS | Status: DC
  Administered 2012-06-10: 10 mL/h via INTRAVENOUS

## 2012-06-10 MED ORDER — PREDNISONE 5 MG PO TABS
5.0000 mg | ORAL_TABLET | Freq: Every day | ORAL | Status: DC
  Administered 2012-06-11 – 2012-06-14 (×4): 5 mg via ORAL
  Filled 2012-06-10 (×6): qty 1

## 2012-06-10 NOTE — Progress Notes (Signed)
ANTICOAGULATION CONSULT NOTE - Initial Consult  Pharmacy Consult for Heparin and Coumadin Indication: chest pain/ACS and atrial fibrillation  Allergies  Allergen Reactions  . Clarithromycin     REACTION: nausea    Patient Measurements: Height: 5\' 5"  (165.1 cm) Weight: 148 lb 2.4 oz (67.2 kg) IBW/kg (Calculated) : 57  Heparin Dosing Weight: 67.2 kg  Vital Signs: Temp: 98.2 F (36.8 C) (09/23 2126) Temp src: Oral (09/23 2126) BP: 154/71 mmHg (09/23 2126) Pulse Rate: 95  (09/23 2126)  Labs:  Basename 06/10/12 2002 06/10/12 1651  HGB -- 14.0  HCT -- 43.0  PLT -- 284  APTT -- --  LABPROT -- 21.0*  INR -- 1.89*  HEPARINUNFRC -- --  CREATININE 0.88 1.03  CKTOTAL -- --  CKMB -- --  TROPONINI <0.30 <0.30    Estimated Creatinine Clearance: 51.2 ml/min (by C-G formula based on Cr of 0.88).   Medical History: Past Medical History  Diagnosis Date  . Unspecified sinusitis (chronic)   . Obstructive sleep apnea   . COPD (chronic obstructive pulmonary disease)     on home O2, chronic steroid use  . Asthma   . Lung nodule   . SVT (supraventricular tachycardia)   . Atrial flutter     typical appearing  . Hypothyroidism   . GERD (gastroesophageal reflux disease)   . Anxiety   . Depression   . History of radiation therapy 11/08/2009-11/18/2009    left upper lung  . Allergy   . Cancer     lung ca s/p XRT 2011  . Lung cancer     non-small cell ca let upper lung stage IA    Medications:  Scheduled:    . buPROPion  150 mg Oral BID  . diltiazem  20 mg Intravenous Once  . diltiazem  20 mg Intravenous Once  . estrogens (conjugated)  0.625 mg Oral Daily  . fluticasone  2 spray Each Nare Daily  . Fluticasone-Salmeterol  1 puff Inhalation BID  . predniSONE  5 mg Oral Q breakfast  . sodium chloride  3 mL Intravenous Q12H  . tiotropium  18 mcg Inhalation Daily  . warfarin  3 mg Oral Once  . Warfarin - Pharmacist Dosing Inpatient   Does not apply q1800     Assessment: 76 yr old female with severe COPD on home oxygen, lung cancer treated with radiation, and a.flutter. She has been treated with diltiazem and warfarin. She presented to ED with rapid atrial flutter and moderate dyspnea. Home dose of coumadin is 2 mg daily except 3 mg on Wed. Her INR is 1.89. Goal of Therapy:  Heparin level 0.3-0.7 units/ml Monitor platelets by anticoagulation protocol: Yes   Plan:  1) Heparin drip at 1000 units/hr, no bolus. 2) Coumadin 3 mg tonight. 3) Daily heparin level, CBC and PT/INR.  Eugene Garnet 06/10/2012,9:34 PM

## 2012-06-10 NOTE — ED Provider Notes (Signed)
History     CSN: [id number]  Arrival date & time 06/10/12  1607   First MD Initiated Contact with Patient 06/10/12 1630      Chief Complaint  Patient presents with  . Fall    In addition to what is below. Patient presents with two-day history of heart palpitations. Patient has been taking her heart rate at home and has been ranging from 100-160 beats per minute. Patient reports going up on her diltiazem over the last 2 days which has not helped to slow down her heart rate. Patient also reports a mechanical fall on Thursday, she explained she was walking and tripped over oxygen pain. Patient denies any head trauma, amnesia to event, or loss of consciousness. However she does state that she did strike her chin on chest and has had chest pain since fall.  She states her SOB is at baseline and she denies the presence of other symptoms.       (Consider location/radiation/quality/duration/timing/severity/associated sxs/prior treatment) Patient is a 76 y.o. female presenting with palpitations. The history is provided by the patient. No language interpreter was used.  Palpitations  This is a recurrent problem. The current episode started 2 days ago. The problem occurs constantly. The problem has not changed since onset.The problem is associated with an unknown factor. Associated symptoms include shortness of breath (at baseline). Pertinent negatives include no diaphoresis, no numbness, no syncope, no abdominal pain and no nausea. The treatment provided no relief.    Past Medical History  Diagnosis Date  . Unspecified sinusitis (chronic)   . Obstructive sleep apnea   . COPD (chronic obstructive pulmonary disease)     on home O2, chronic steroid use  . Asthma   . Lung nodule   . SVT (supraventricular tachycardia)   . Atrial flutter     typical appearing  . Hypothyroidism   . GERD (gastroesophageal reflux disease)   . Anxiety   . Depression   . History of radiation therapy  11/08/2009-11/18/2009    left upper lung  . Allergy   . Cancer     lung ca s/p XRT 2011  . Lung cancer     non-small cell ca let upper lung stage IA    Past Surgical History  Procedure Date  . Hysterectomy     abdominal  . Tonsillectomy   . Cystectomy     removal from scalp  . Bladder repair     bladder tack  . Appendectomy   . Lung biopsy     Needle bx lung nodule - atypical cells- complicated by PTX/ chest tube  . Abdominal hysterectomy     Family History  Problem Relation Age of Onset  . Heart disease Mother 53    History  Substance Use Topics  . Smoking status: Former Smoker -- 2.0 packs/day for 40 years    Types: Cigarettes    Quit date: 09/18/1986  . Smokeless tobacco: Never Used  . Alcohol Use: No    OB History    Grav Para Term Preterm Abortions TAB SAB Ect Mult Living                  Review of Systems  Constitutional: Negative for diaphoresis.  Respiratory: Positive for shortness of breath (at baseline).   Cardiovascular: Positive for palpitations. Negative for syncope.  Gastrointestinal: Negative for nausea and abdominal pain.  Neurological: Negative for numbness.  All other systems reviewed and are negative.    Allergies  Clarithromycin  Home  Medications   Current Outpatient Rx  Name Route Sig Dispense Refill  . ADVAIR DISKUS 250-50 MCG/DOSE IN AEPB  inhale 1 dose by mouth twice a day 60 each 2  . PROAIR HFA IN Inhalation Inhale into the lungs. AS directed     . BUPROPION HCL ER (SR) 150 MG PO TB12 Oral Take 150 mg by mouth 2 (two) times daily.      Marland Kitchen DILTIAZEM HCL ER COATED BEADS 240 MG PO CP24 Oral Take 240 mg by mouth 2 (two) times daily.      Marland Kitchen DOXYCYCLINE HYCLATE 100 MG PO TABS  2 today then one daily 8 tablet 0  . EPINEPHRINE 0.15 MG/0.3ML IJ DEVI Intramuscular Inject 0.15 mg into the muscle as needed.      Marland Kitchen ESTROGENS CONJUGATED 0.625 MG PO TABS Oral Take 0.625 mg by mouth daily. Take daily for 21 days then do not take for 7 days.       Marland Kitchen FLUTICASONE PROPIONATE 50 MCG/ACT NA SUSP Nasal Place 2 sprays into the nose daily. 1 g 11  . GUAIFENESIN ER 600 MG PO TB12 Oral Take 1,200 mg by mouth as needed.      . DUONEB IN  As directed     . LEVOTHYROXINE SODIUM 75 MCG PO TABS Oral Take 75 mcg by mouth daily.      Marland Kitchen MONTELUKAST SODIUM 10 MG PO TABS  take 1 tablet by mouth once daily 30 tablet 6  . NON FORMULARY  Allergy Vaccine 1:10 GH     . NON FORMULARY  CPAP     . NON FORMULARY  Oxygen 2-3 LPM     . OMALIZUMAB 150 MG [place] SOLR Subcutaneous Inject into the skin. 225mg  IM every 2 weeks     . PREDNISONE 5 MG PO TABS  TAKE 1 TABLET BY MOUTH DAILY OR AS DIRECTED 100 tablet 0  . RISEDRONATE SODIUM 35 MG PO TABS Oral Take 35 mg by mouth every 7 (seven) days. with water on empty stomach, nothing by mouth or lie down for next 30 minutes.     . THEOPHYLLINE ER 300 MG PO TB12  take 1/2 tablet by mouth twice a day 30 tablet 5  . SPIRIVA HANDIHALER IN Inhalation Inhale into the lungs. daily     . TRAMADOL HCL 50 MG PO TABS Oral Take 1 tablet by mouth 4 (four) times daily as needed.    . WARFARIN SODIUM 5 MG PO TABS Oral Take 1 mg by mouth daily. As directed, now takes 2 tabs 1mg  at night except wednesdays, then takes 3 tabs of 1mg  coumadin at night    . ZOLPIDEM TARTRATE 5 MG PO TABS Oral Take 1 tablet (5 mg total) by mouth at bedtime as needed for sleep. 30 tablet 5    BP 143/69  Temp 98.4 F (36.9 C)  Resp 30  SpO2 90%  Physical Exam  Constitutional: She is oriented to person, place, and time. She appears well-developed and well-nourished.  HENT:  Head: Normocephalic and atraumatic.  Right Ear: External ear normal.  Left Ear: External ear normal.  Nose: Nose normal.  Mouth/Throat: Oropharynx is clear and moist.  Eyes: Conjunctivae normal and EOM are normal. Pupils are equal, round, and reactive to light.  Neck: Normal range of motion. Neck supple. No tracheal deviation present. No thyromegaly present.  Cardiovascular: Intact  distal pulses.  An irregularly irregular rhythm present. Tachycardia present.   Pulmonary/Chest: Effort normal. No respiratory distress. She has wheezes (  mild end expiratory wheezes). She has no rales. She exhibits tenderness (moderate TTP over anteirior chest with overlying ecchymosis.  ).  Abdominal: Soft. Bowel sounds are normal. She exhibits no distension and no mass. There is no tenderness. There is no rebound and no guarding.  Musculoskeletal: Normal range of motion. She exhibits tenderness (moderate TTP over anterior and lateral left shoulder - mild pain with A/PROM of left shoulder - NVI and no skin defects noted.  ). She exhibits no edema.  Neurological: She is alert and oriented to person, place, and time. She has normal reflexes. No cranial nerve deficit.  Skin: Skin is warm and dry.  Psychiatric: She has a normal mood and affect.    ED Course  Procedures (including critical care time)  Labs Reviewed  CBC WITH DIFFERENTIAL - Abnormal; Notable for the following:    WBC 10.9 (*)     Neutrophils Relative 82 (*)     Neutro Abs 8.9 (*)     Lymphocytes Relative 10 (*)     All other components within normal limits  PROTIME-INR - Abnormal; Notable for the following:    Prothrombin Time 21.0 (*)     INR 1.89 (*)     All other components within normal limits  BASIC METABOLIC PANEL - Abnormal; Notable for the following:    Glucose, Bld 125 (*)     GFR calc non Af Amer 53 (*)     GFR calc Af Amer 61 (*)     All other components within normal limits  COMPREHENSIVE METABOLIC PANEL - Abnormal; Notable for the following:    Glucose, Bld 133 (*)     Albumin 3.0 (*)     GFR calc non Af Amer 64 (*)     GFR calc Af Amer 74 (*)     All other components within normal limits  TROPONIN I  TROPONIN I  TSH  TROPONIN I  TROPONIN I  HEPARIN LEVEL (UNFRACTIONATED)  CBC  PROTIME-INR  MRSA PCR SCREENING   Dg Chest 2 View  06/10/2012  *RADIOLOGY REPORT*  Clinical Data:  Fall   CHEST - 2 VIEW   Comparison: CT thorax 05/30/2012  Findings: Stable cardiac silhouette.  No evidence of pulmonary contusion, pleural fluid, no pneumothorax.  The patient's chin does obscure upper thorax.  No evidence of fracture.  Lungs are hyperinflated.  There is a compression fracture of the upper thoracic spine as well as the lower thoracic spine which is similar to comparison CT.  IMPRESSION:  1.  No radiographic evidence of acute thoracic trauma. 2.  Emphysematous change. 3.  Chronic compression fracture of the thoracic spine.   Original Report Authenticated By: Genevive Bi, M.D.    Ct Head Wo Contrast  06/10/2012  *RADIOLOGY REPORT*  Clinical Data:  76 year old female status post fall.  History of lung cancer.  CT HEAD WITHOUT CONTRAST CT CERVICAL SPINE WITHOUT CONTRAST  Technique:  Multidetector CT imaging of the head and cervical spine was performed following the standard protocol without intravenous contrast.  Multiplanar CT image reconstructions of the cervical spine were also generated.  Comparison:  Chest CT 05/30/2012.  Head CT 07/10/2005 paranasal sinus CT 05/03/2005.  CT HEAD  Findings: No focal scalp hematoma identified.  No acute orbit soft tissue findings seen.  Minimal left ethmoid air cell mucosal thickening.  Calvarium intact with no suspicious osseous lesion.  Mild generalized cerebral volume loss since 2006.  No ventriculomegaly.  Patchy confluent bilateral white matter hypodensity is new.  No midline shift, mass effect, or evidence of mass lesion.  No acute intracranial hemorrhage identified.  No evidence of cortically based acute infarction identified.  IMPRESSION: 1.  No acute traumatic injury identified to the head. 2.  Mild generalized cerebral volume loss and moderate nonspecific white matter changes since 2006. 3.  Cervical spine findings are below.  CT CERVICAL SPINE  Findings: Straightening of cervical lordosis. Visualized skull base is intact.  No atlanto-occipital dissociation.  Trace  anterolisthesis of C6 on C7. Widespread severe cervical facet hypertrophy and degeneration. Bilateral posterior element alignment is within normal limits.  Trace anterolisthesis of C7 on T1. No acute cervical fracture identified.  Chronic heterogeneous sclerosis and collapse of the T2 vertebra is stable from the recent comparison.  T3 heterogeneity is partially visible.  Severe bullous emphysema in the right apex is stable.  Chronic scarring consolidation and medial left apex appears stable.  Stable calcified right thyroid nodule.  Other Visualized paraspinal soft tissues are within normal limits.  IMPRESSION: 1. No acute fracture identified in the cervical spine.  Ligamentous injury is not excluded.  2.  Advance cervical facet degeneration. 3.  Stable pathologic appearing compression fracture of T2.   Original Report Authenticated By: Harley Hallmark, M.D.    Ct Cervical Spine Wo Contrast  06/10/2012  *RADIOLOGY REPORT*  Clinical Data:  76 year old female status post fall.  History of lung cancer.  CT HEAD WITHOUT CONTRAST CT CERVICAL SPINE WITHOUT CONTRAST  Technique:  Multidetector CT imaging of the head and cervical spine was performed following the standard protocol without intravenous contrast.  Multiplanar CT image reconstructions of the cervical spine were also generated.  Comparison:  Chest CT 05/30/2012.  Head CT 07/10/2005 paranasal sinus CT 05/03/2005.  CT HEAD  Findings: No focal scalp hematoma identified.  No acute orbit soft tissue findings seen.  Minimal left ethmoid air cell mucosal thickening.  Calvarium intact with no suspicious osseous lesion.  Mild generalized cerebral volume loss since 2006.  No ventriculomegaly.  Patchy confluent bilateral white matter hypodensity is new. No midline shift, mass effect, or evidence of mass lesion.  No acute intracranial hemorrhage identified.  No evidence of cortically based acute infarction identified.  IMPRESSION: 1.  No acute traumatic injury identified to  the head. 2.  Mild generalized cerebral volume loss and moderate nonspecific white matter changes since 2006. 3.  Cervical spine findings are below.  CT CERVICAL SPINE  Findings: Straightening of cervical lordosis. Visualized skull base is intact.  No atlanto-occipital dissociation.  Trace anterolisthesis of C6 on C7. Widespread severe cervical facet hypertrophy and degeneration. Bilateral posterior element alignment is within normal limits.  Trace anterolisthesis of C7 on T1. No acute cervical fracture identified.  Chronic heterogeneous sclerosis and collapse of the T2 vertebra is stable from the recent comparison.  T3 heterogeneity is partially visible.  Severe bullous emphysema in the right apex is stable.  Chronic scarring consolidation and medial left apex appears stable.  Stable calcified right thyroid nodule.  Other Visualized paraspinal soft tissues are within normal limits.  IMPRESSION: 1. No acute fracture identified in the cervical spine.  Ligamentous injury is not excluded.  2.  Advance cervical facet degeneration. 3.  Stable pathologic appearing compression fracture of T2.   Original Report Authenticated By: Harley Hallmark, M.D.    Dg Shoulder Left  06/10/2012  *RADIOLOGY REPORT*  Clinical Data: Fall and left shoulder pain.  LEFT SHOULDER - 2+ VIEW  Comparison: Chest radiograph 06/10/2012  Findings: Three views  of the left shoulder are negative for fracture or dislocation.  Degenerative facet changes in the cervical spine.  The left AC joint is intact.  IMPRESSION: No acute bony abnormality.   Original Report Authenticated By: Richarda Overlie, M.D.     Date: 06/10/2012  Rate: 148  Rhythm: atrial flutter  QRS Axis: normal  Intervals: QT prolonged  ST/T Wave abnormalities: nonspecific ST changes  Conduction Disutrbances:none  Narrative Interpretation:   Old EKG Reviewed: atrial fibrillation and rate have changed from prior    1. Atrial flutter   2.  Sub therapeutic INR 3.   Leukocytosis     MDM    Patient is a 76 year old female who presents to emergency department with complaints of two-day history of palpitations as well as mechanical fall. Upon arrival in the emergency department patient was noted to be afebrile and vital signs remarkable for initial heart rate of 158.  On exam patient with large area of ecchymosis over anterior chest, irregularly irregular heart rhythm with tachycardia, but otherwise noncontributory. EKG completed and showed A. fib/A. Flutter.  To evaluate for possible traumatic injuries status post fall, head CT and CT of C-spine were completed. Results were unremarkable. Chest x-ray and left shoulder x-ray were also completed due to physical exam findings and results showed no evidence of acute traumatic injury.  For rate control, patient given 20 mg IV bolus of diltiazem and started on rate of 5 mg/hr.  Rate increased to 10 mg/hr due to HR persistently in the 140s.  Additional 20 mg bolus given and rate increased to 15 mg/hr.  Patient felt to warrant cardiology admission for further rate control management.  BP remained stable while in the ED, patient without new complaints, and admitted without events.          Johnney Ou, MD 06/11/12 0005

## 2012-06-10 NOTE — Telephone Encounter (Signed)
CALLED PATIENT TO INFORM OF TEST AND FU FOR DEC. 2013, SPOKE WITH PATIENT'S HUSBAND WAYNE AND THEY ARE AWARE OF THESE APPTS.

## 2012-06-10 NOTE — H&P (Signed)
History and Physical   Admit date: 06/10/2012 Name:  [name] Medical record number: [id number] DOB/Age:  April 19, 1936  76 y.o. female  Referring Physician:  [facility]  Primary Cardiologist:Dr. Everette Rank  Primary Physician:Dr. Tyson Dense  Chief complaint/reason for admission: Palpitations and shortness of breath   HPI:  This 76 year old female has a prior history of severe COPD as well as sleep apnea and is on home oxygen. She has a history of lung cancer stage I and has been treated with radiation therapy. In January of 2012 she developed symptomatic atrial flutter that was unresponsive to adenosine and placed on diltiazem. She had recurrent symptoms in April and was found to be in atrial flutter. At the time she did not wish to consider an ablation and was treated with diltiazem and warfarin. She developed recurrent palpitations yesterday and came to the emergency room today with rapid atrial flutter and this has not been responsive to intravenous diltiazem. She does not have any chest discomfort. She has moderate dyspnea. She denies PND or orthopnea.   Past Medical History  Diagnosis Date  . Unspecified sinusitis (chronic)   . Obstructive sleep apnea   . COPD (chronic obstructive pulmonary disease)     on home O2, chronic steroid use  . Asthma   . Lung nodule   . SVT (supraventricular tachycardia)   . Atrial flutter     typical appearing  . Hypothyroidism   . GERD (gastroesophageal reflux disease)   . Anxiety   . Depression   . History of radiation therapy 11/08/2009-11/18/2009    left upper lung  . Allergy   . Cancer     lung ca s/p XRT 2011  . Lung cancer     non-small cell ca let upper lung stage IA      Past Surgical History  Procedure Date  . Hysterectomy     abdominal  . Tonsillectomy   . Cystectomy     removal from scalp  . Bladder repair     bladder tack  . Appendectomy   . Lung biopsy     Needle bx lung nodule - atypical cells-  complicated by PTX/ chest tube  . Abdominal hysterectomy    Allergies: is allergic to clarithromycin.   Medications: Prior to Admission medications   Medication Sig Start Date End Date Taking? Authorizing Provider  ADVAIR DISKUS 250-50 MCG/DOSE AEPB inhale 1 dose by mouth twice a day 03/11/12  Yes Clinton D Young, MD  Albuterol Sulfate (PROAIR HFA IN) Inhale 2 puffs into the lungs daily as needed. AS directed   Yes Historical Provider, MD  buPROPion (WELLBUTRIN SR) 150 MG 12 hr tablet Take 150 mg by mouth 2 (two) times daily.     Yes Historical Provider, MD  diltiazem (CARDIZEM CD) 240 MG 24 hr capsule Take 240 mg by mouth 2 (two) times daily.     Yes Historical Provider, MD  doxycycline (VIBRA-TABS) 100 MG tablet 2 today then one daily 05/09/12  Yes Waymon Budge, MD  EPINEPHrine (EPIPEN JR) 0.15 MG/0.3ML injection Inject 0.15 mg into the muscle as needed.     Yes Historical Provider, MD  estrogens, conjugated, (PREMARIN) 0.625 MG tablet Take 0.625 mg by mouth daily. Take daily for 21 days then do not take for 7 days.    Yes Historical Provider, MD  fluticasone (FLONASE) 50 MCG/ACT nasal spray Place 2 sprays into the nose daily. 11/10/11  Yes Waymon Budge, MD  guaiFENesin [facility])  600 MG 12 hr tablet Take 1,200 mg by mouth as needed.     Yes Historical Provider, MD  Ipratropium-Albuterol (DUONEB IN) As directed    Yes Historical Provider, MD  NON FORMULARY Allergy Vaccine 1:10 GH    Yes Historical Provider, MD  NON FORMULARY CPAP    Yes Historical Provider, MD  NON FORMULARY Oxygen 2-3 LPM    Yes Historical Provider, MD  omalizumab [place]) 150 MG injection Inject into the skin. 225mg  IM every 2 weeks    Yes Historical Provider, MD  predniSONE (DELTASONE) 5 MG tablet TAKE 1 TABLET BY MOUTH DAILY OR AS DIRECTED 06/03/12  Yes Waymon Budge, MD  risedronate (ACTONEL) 35 MG tablet Take 35 mg by mouth every 7 (seven) days. with water on empty stomach, nothing by mouth or lie down for next 30  minutes.    Yes Historical Provider, MD  Tiotropium Bromide Monohydrate (SPIRIVA HANDIHALER IN) Inhale into the lungs. daily    Yes Historical Provider, MD  traMADol (ULTRAM) 50 MG tablet Take 1 tablet by mouth 4 (four) times daily as needed. 10/25/11  Yes Historical Provider, MD  warfarin (COUMADIN) 5 MG tablet Take 1 mg by mouth daily. As directed, now takes 2 tabs 1mg  at night except wednesdays, then takes 3 tabs of 1mg  coumadin at night   Yes Historical Provider, MD    Family History:  Family Status  Relation Status Death Age  . Mother Deceased 66  . Father Deceased 68    died of suicide    Social History:   reports that she quit smoking about 25 years ago. Her smoking use included Cigarettes. She has a 80 pack-year smoking history. She has never used smokeless tobacco. She reports that she does not drink alcohol or use illicit drugs.   History   Social History Narrative   Patient states former smoker.  Quit 1988.RetiredMarried with 2 children     Review of Systems: She recently reported falling when she tripped over her oxygen cord and injured her left shoulder. She has frequency of urination. She has moderate arthritic type symptoms. She has severe dyspnea on exertion and severe dyspnea rescue she has a history of depression as well as some anxiety. She has occasional diarrhea and occasional constipation. She has been on chronic steroids in the past. Other than as noted above, the remainder of the review of systems is normal  Physical Exam: BP 111/73  Pulse 154  Temp 98.4 F (36.9 C)  Resp 29  SpO2 97% General appearance: Chronically ill-appearing white female with cushingoid features wearing oxygen but in no acute distress Head: Normocephalic, without obvious abnormality, atraumatic Eyes: conjunctivae/corneas clear. PERRL, EOM's intact. Fundi benign. Neck: no adenopathy, no carotid bruit, no JVD and supple, symmetrical, trachea midline Lungs: Increased AP diameter,  bilateral wheezing noted Heart: Rapid regular rhythm, no murmur heard Abdomen: soft, non-tender; bowel sounds normal; no masses,  no organomegaly Pelvic: deferred Extremities: Significant ecchymoses over sternum as well as her left knee, trace edema Pulses: 2+ and symmetric Skin: Ecchymoses noted over sternum and her lower leg Neurologic: Grossly normal  Labs: CBC  Basename 06/10/12 1651  WBC 10.9*  RBC 4.80  HGB 14.0  HCT 43.0  PLT 284  MCV 89.6  MCH 29.2  MCHC 32.6  RDW 13.3  LYMPHSABS 1.0  MONOABS 0.9  EOSABS 0.1  BASOSABS 0.0   CMP   Basename 06/10/12 1651  NA 140  K 3.9  CL 99  CO2 27  GLUCOSE 125*  BUN 20  CREATININE 1.03  CALCIUM 10.1  PROT --  ALBUMIN --  AST --  ALT --  ALKPHOS --  BILITOT --  GFRNONAA 53*  GFRAA 61*   Cardiac Panel (last 3 results)  Basename 06/10/12 1651  CKTOTAL --  CKMB --  TROPONINI <0.30  RELINDX --   Thyroid  Lab Results  Component Value Date   TSH 0.749 01/16/2011    EKG: Atrial flutter with rapid ventricular response 2-1 conduction, nonspecific ST changes  Radiology: Severe COPD and emphysema with compression fractures of spine   IMPRESSIONS: 1. Recurrent atrial flutter with rapid response 2. Severe COPD on home oxygen 3. History of stage I lung cancer treated with radiation therapy 4. Hypothyroidism #5 tear sleep apnea  PLAN: The patient's INR is subtherapeutic and she will be placed on intravenous heparin. She'll be placed on a higher dose of intravenous diltiazem. She could be considered for an ibutilide cardioversion. She also could be considered for an atrial flutter ablation. I don't think she is a good candidate for amiodarone because of her lung disease.  Signed: Darden Palmer MD [facility] Card[facility]  06/10/2012, 7:33 PM

## 2012-06-10 NOTE — ED Notes (Addendum)
Per pt sts chest pain related to trip and fall she had Thursday. Pt has bruising to chest and arm. Pt SOB but on o2 all the time and sts she is always SOB. sts also her HR has been elevated and she took an extra pill but it did not help.

## 2012-06-10 NOTE — ED Notes (Signed)
Pt back from CT

## 2012-06-11 DIAGNOSIS — I4892 Unspecified atrial flutter: Principal | ICD-10-CM

## 2012-06-11 DIAGNOSIS — J449 Chronic obstructive pulmonary disease, unspecified: Secondary | ICD-10-CM

## 2012-06-11 DIAGNOSIS — I498 Other specified cardiac arrhythmias: Secondary | ICD-10-CM

## 2012-06-11 LAB — CBC
HCT: 38.6 % (ref 36.0–46.0)
Hemoglobin: 13.1 g/dL (ref 12.0–15.0)
MCH: 30.3 pg (ref 26.0–34.0)
MCHC: 33.9 g/dL (ref 30.0–36.0)
MCV: 89.4 fL (ref 78.0–100.0)
Platelets: 256 10*3/uL (ref 150–400)
RBC: 4.32 MIL/uL (ref 3.87–5.11)
RDW: 13.5 % (ref 11.5–15.5)
WBC: 10.7 10*3/uL — ABNORMAL HIGH (ref 4.0–10.5)

## 2012-06-11 LAB — HEPARIN LEVEL (UNFRACTIONATED): Heparin Unfractionated: 0.26 IU/mL — ABNORMAL LOW (ref 0.30–0.70)

## 2012-06-11 LAB — TROPONIN I: Troponin I: <0.3 ng/mL (ref <0.30)

## 2012-06-11 LAB — MRSA PCR SCREENING: MRSA by PCR: NEGATIVE

## 2012-06-11 MED ORDER — DILTIAZEM HCL ER COATED BEADS 360 MG PO CP24
360.0000 mg | ORAL_CAPSULE | Freq: Every day | ORAL | Status: DC
  Administered 2012-06-11: 360 mg via ORAL
  Filled 2012-06-11 (×3): qty 1

## 2012-06-11 MED ORDER — WARFARIN SODIUM 4 MG PO TABS
4.0000 mg | ORAL_TABLET | Freq: Once | ORAL | Status: AC
  Administered 2012-06-11: 4 mg via ORAL
  Filled 2012-06-11: qty 1

## 2012-06-11 MED ORDER — HEPARIN (PORCINE) IN NACL 100-0.45 UNIT/ML-% IJ SOLN
1350.0000 [IU]/h | INTRAMUSCULAR | Status: DC
  Administered 2012-06-11: 1350 [IU]/h via INTRAVENOUS
  Filled 2012-06-11 (×2): qty 250

## 2012-06-11 NOTE — Progress Notes (Signed)
  Echocardiogram 2D Echocardiogram has been performed.  [place] 06/11/2012, 12:03 PM

## 2012-06-11 NOTE — Progress Notes (Signed)
Pt. Was placed on home cpap by RN. RT inspected the cpap, no frayed wires or cords. Cpap appears to be intact. Pt. Tolerating well.

## 2012-06-11 NOTE — Care Management Note (Signed)
    Page 1 of 1   06/11/2012     9:44:25 AM   CARE MANAGEMENT NOTE 06/11/2012  Patient:  [name],[name]   Account Number:  [id number]  Date Initiated:  06/11/2012  Documentation initiated by:  [name]  Subjective/Objective Assessment:   adm w at flutter     Action/Plan:   lives w husband, has home o2 w [facility], pcp dr Jerelyn Scott hussain   Anticipated DC Date:     Anticipated DC Plan:        DC Planning Services  CM consult      Choice offered to / List presented to:             Status of service:   Medicare Important Message given?   (If response is "NO", the following Medicare IM given date fields will be blank) Date Medicare IM given:   Date Additional Medicare IM given:    Discharge Disposition:    Per UR Regulation:  Reviewed for med. necessity/level of care/duration of stay  If discussed at Long Length of Stay Meetings, dates discussed:    Comments:  9/24 9:43a Joan Joan Pereda rn,bsn [phone]

## 2012-06-11 NOTE — Progress Notes (Signed)
SUBJECTIVE:  Converted to NSR.  No complaints this am  OBJECTIVE:   Vitals:   Filed Vitals:   06/11/12 0728 06/11/12 0729 06/11/12 0800 06/11/12 0900  BP:  119/55 129/55 126/60  Pulse:   81 80  Temp: 98.2 F (36.8 C)     TempSrc: Oral     Resp:   24 26  Height:      Weight:      SpO2:  97% 98% 98%   I&O's:   Intake/Output Summary (Last 24 hours) at 06/11/12 1610 Last data filed at 06/11/12 0900  Gross per 24 hour  Intake 512.16 ml  Output   1150 ml  Net -637.84 ml   TELEMETRY: Reviewed telemetry pt in NSR     PHYSICAL EXAM General: Well developed, well nourished, in no acute distress Head: Eyes PERRLA, No xanthomas.   Normal cephalic and atramatic  Lungs:   Clear bilaterally to auscultation and percussion. Heart:   HRRR S1 S2 Pulses are 2+ & equal. Abdomen: Bowel sounds are positive, abdomen soft and non-tender without masses  Extremities:   No clubbing, cyanosis or edema.  DP +1 Neuro: Alert and oriented X 3. Psych:  Good affect, responds appropriately   LABS: Basic Metabolic Panel:  Basename 06/10/12 2002 06/10/12 1651  NA 137 140  K 3.8 3.9  CL 102 99  CO2 24 27  GLUCOSE 133* 125*  BUN 18 20  CREATININE 0.88 1.03  CALCIUM 9.2 10.1  MG -- --  PHOS -- --   Liver Function Tests:  [facility] 06/10/12 2002  AST 16  ALT 17  ALKPHOS 93  BILITOT 0.3  PROT 6.7  ALBUMIN 3.0*   No results found for this basename: LIPASE:2,AMYLASE:2 in the last 72 hours CBC:  Basename 06/11/12 0449 06/10/12 1651  WBC 10.7* 10.9*  NEUTROABS -- 8.9*  HGB 13.1 14.0  HCT 38.6 43.0  MCV 89.4 89.6  PLT 256 284   Cardiac Enzymes:  Basename 06/11/12 0818 06/11/12 0151 06/10/12 2002  CKTOTAL -- -- --  CKMB -- -- --  CKMBINDEX -- -- --  TROPONINI <0.30 <0.30 <0.30   Thyroid Function Tests:  Basename 06/10/12 2002  TSH 2.950  T4TOTAL --  T3FREE --  THYROIDAB --   Anemia Panel: No results found for this basename: VITAMINB12,FOLATE,FERRITIN,TIBC,IRON,RETICCTPCT in  the last 72 hours Coag Panel:   Lab Results  Component Value Date   INR 1.93* 06/11/2012   INR 1.89* 06/10/2012   INR 2.64* 03/15/2011    RADIOLOGY: Dg Chest 2 View  06/10/2012  *RADIOLOGY REPORT*  Clinical Data:  Fall   CHEST - 2 VIEW  Comparison: CT thorax 05/30/2012  Findings: Stable cardiac silhouette.  No evidence of pulmonary contusion, pleural fluid, no pneumothorax.  The patient's chin does obscure upper thorax.  No evidence of fracture.  Lungs are hyperinflated.  There is a compression fracture of the upper thoracic spine as well as the lower thoracic spine which is similar to comparison CT.  IMPRESSION:  1.  No radiographic evidence of acute thoracic trauma. 2.  Emphysematous change. 3.  Chronic compression fracture of the thoracic spine.   Original Report Authenticated By: Genevive Bi, M.D.    Ct Head Wo Contrast  06/10/2012  *RADIOLOGY REPORT*  Clinical Data:  76 year old female status post fall.  History of lung cancer.  CT HEAD WITHOUT CONTRAST CT CERVICAL SPINE WITHOUT CONTRAST  Technique:  Multidetector CT imaging of the head and cervical spine was performed following the standard protocol  without intravenous contrast.  Multiplanar CT image reconstructions of the cervical spine were also generated.  Comparison:  Chest CT 05/30/2012.  Head CT 07/10/2005 paranasal sinus CT 05/03/2005.  CT HEAD  Findings: No focal scalp hematoma identified.  No acute orbit soft tissue findings seen.  Minimal left ethmoid air cell mucosal thickening.  Calvarium intact with no suspicious osseous lesion.  Mild generalized cerebral volume loss since 2006.  No ventriculomegaly.  Patchy confluent bilateral white matter hypodensity is new. No midline shift, mass effect, or evidence of mass lesion.  No acute intracranial hemorrhage identified.  No evidence of cortically based acute infarction identified.  IMPRESSION: 1.  No acute traumatic injury identified to the head. 2.  Mild generalized cerebral volume loss  and moderate nonspecific white matter changes since 2006. 3.  Cervical spine findings are below.  CT CERVICAL SPINE  Findings: Straightening of cervical lordosis. Visualized skull base is intact.  No atlanto-occipital dissociation.  Trace anterolisthesis of C6 on C7. Widespread severe cervical facet hypertrophy and degeneration. Bilateral posterior element alignment is within normal limits.  Trace anterolisthesis of C7 on T1. No acute cervical fracture identified.  Chronic heterogeneous sclerosis and collapse of the T2 vertebra is stable from the recent comparison.  T3 heterogeneity is partially visible.  Severe bullous emphysema in the right apex is stable.  Chronic scarring consolidation and medial left apex appears stable.  Stable calcified right thyroid nodule.  Other Visualized paraspinal soft tissues are within normal limits.  IMPRESSION: 1. No acute fracture identified in the cervical spine.  Ligamentous injury is not excluded.  2.  Advance cervical facet degeneration. 3.  Stable pathologic appearing compression fracture of T2.   Original Report Authenticated By: Harley Hallmark, M.D.    Ct Chest Wo Contrast  05/30/2012  *RADIOLOGY REPORT*  Clinical Data: Lung cancer status post SBRT.  Restaging scan.  CT CHEST WITHOUT CONTRAST  Technique:  Multidetector CT imaging of the chest was performed following the standard protocol without IV contrast.  Comparison: PET CT 02/13/2012.  Chest CT 12/13/2011.  Findings:  Mediastinum: Heart size is normal. There is no significant pericardial fluid, thickening or pericardial calcification. There is atherosclerosis of the thoracic aorta, the great vessels of the mediastinum and the coronary arteries, including calcified atherosclerotic plaque in the left main and left circumflex coronary arteries. No pathologically enlarged mediastinal or hilar lymph nodes. Please note that accurate exclusion of hilar adenopathy is limited on noncontrast CT scans.  The esophagus is  unremarkable in appearance.  Lungs/Pleura:  Over the prior several examinations there has been progressive architectural distortion in the medial aspect of the left apex where there is again some focal pleural based soft tissue thickening.  At this time, there appears to be some developing calcifications within this region of soft tissue thickening.  The appearance of this region is nonspecific, and is similar in size to prior examinations measuring up to 2.3 x 1.5 cm; it is uncertain whether not this represents local recurrence of disease, or simply progressive nodular pleural-based fibrosis and scarring.  Scarring in the inferior segment of the lingula is unchanged.  No other definite new or enlarging suspicious appearing pulmonary nodules or masses identified.  Linear opacity in the periphery of the right lower lobe is most compatible with scarring.  Background of moderate - severe centrilobular and paraseptal emphysema with right apical bullous changes and right apical architectural distortion and pleural parenchymal scarring is similar to prior examinations. No acute consolidative airspace disease.  No  pleural effusions.  Upper Abdomen: 1.8 cm ow-attenuation lesion in the lateral aspect of the interpolar region of the left kidney is not characterized on this noncontrast CT examination, but is similar to prior examinations and likely to represent a cyst.  Atherosclerosis.  Musculoskeletal: Old compression fractures at T2, T3 and T12 are unchanged.  Sclerosis of T2 and T3 may suggest some post radiation osteonecrosis (unchanged).There are no aggressive appearing lytic or blastic lesions noted in the visualized portions of the skeleton.  IMPRESSION: 1.  The irregular shaped nodular pleural-based opacity in the apex of the left upper lobe is very similar to recent prior studies, measuring approximately 2.3 x 1.5 cm on today's examination.  Given the relative stability in size over the prior 109-month time interval,  this is favored to represent evolving nodular appearing postradiation fibrosis, rather than a true local recurrence. However, continued attention on follow-up studies is recommended. No new pulmonary nodules, and no evidence of mediastinal or hilar lymphadenopathy is identified on today's examination. 2.  Other incidental findings, as above.   Original Report Authenticated By: Florencia Reasons, M.D.    Ct Cervical Spine Wo Contrast  06/10/2012  *RADIOLOGY REPORT*  Clinical Data:  76 year old female status post fall.  History of lung cancer.  CT HEAD WITHOUT CONTRAST CT CERVICAL SPINE WITHOUT CONTRAST  Technique:  Multidetector CT imaging of the head and cervical spine was performed following the standard protocol without intravenous contrast.  Multiplanar CT image reconstructions of the cervical spine were also generated.  Comparison:  Chest CT 05/30/2012.  Head CT 07/10/2005 paranasal sinus CT 05/03/2005.  CT HEAD  Findings: No focal scalp hematoma identified.  No acute orbit soft tissue findings seen.  Minimal left ethmoid air cell mucosal thickening.  Calvarium intact with no suspicious osseous lesion.  Mild generalized cerebral volume loss since 2006.  No ventriculomegaly.  Patchy confluent bilateral white matter hypodensity is new. No midline shift, mass effect, or evidence of mass lesion.  No acute intracranial hemorrhage identified.  No evidence of cortically based acute infarction identified.  IMPRESSION: 1.  No acute traumatic injury identified to the head. 2.  Mild generalized cerebral volume loss and moderate nonspecific white matter changes since 2006. 3.  Cervical spine findings are below.  CT CERVICAL SPINE  Findings: Straightening of cervical lordosis. Visualized skull base is intact.  No atlanto-occipital dissociation.  Trace anterolisthesis of C6 on C7. Widespread severe cervical facet hypertrophy and degeneration. Bilateral posterior element alignment is within normal limits.  Trace  anterolisthesis of C7 on T1. No acute cervical fracture identified.  Chronic heterogeneous sclerosis and collapse of the T2 vertebra is stable from the recent comparison.  T3 heterogeneity is partially visible.  Severe bullous emphysema in the right apex is stable.  Chronic scarring consolidation and medial left apex appears stable.  Stable calcified right thyroid nodule.  Other Visualized paraspinal soft tissues are within normal limits.  IMPRESSION: 1. No acute fracture identified in the cervical spine.  Ligamentous injury is not excluded.  2.  Advance cervical facet degeneration. 3.  Stable pathologic appearing compression fracture of T2.   Original Report Authenticated By: Harley Hallmark, M.D.    Dg Shoulder Left  06/10/2012  *RADIOLOGY REPORT*  Clinical Data: Fall and left shoulder pain.  LEFT SHOULDER - 2+ VIEW  Comparison: Chest radiograph 06/10/2012  Findings: Three views of the left shoulder are negative for fracture or dislocation.  Degenerative facet changes in the cervical spine.  The left [facility] joint is  intact.  IMPRESSION: No acute bony abnormality.   Original Report Authenticated By: Richarda Overlie, M.D.       ASSESSMENT:  1.  Atrial flutter with RVR resolved and now in NSR 2.  Systemic anticoagulation with slightly subtherapuetic INR 3.  OSA 4.  Severe COPD on home O2 5.  Stage I lung CA s/p XRT 6.  GERD 7.  Hypothyroidism  PLAN:   1.  I will get an EP consult - she has seen Dr. Johney Frame in the past  2.  Continue IV Heparin until INR greater than 2 3.  D/C IV Cardizem gtt 4.  Cardizem CD 360mg  daily  Quintella Reichert, MD  06/11/2012  9:52 AM

## 2012-06-11 NOTE — Progress Notes (Signed)
ANTICOAGULATION CONSULT NOTE  Pharmacy Consult for Heparin and Coumadin Indication: chest pain/ACS and atrial fibrillation  Allergies  Allergen Reactions  . Clarithromycin     REACTION: nausea    Patient Measurements: Height: 5\' 5"  (165.1 cm) Weight: 148 lb 2.4 oz (67.2 kg) IBW/kg (Calculated) : 57  Heparin Dosing Weight: 67.2 kg  Vital Signs: Temp: 98.5 F (36.9 C) (09/24 1155) Temp src: Oral (09/24 1155) BP: 139/64 mmHg (09/24 1300) Pulse Rate: 94  (09/24 1300)  Labs:  Basename 06/11/12 1341 06/11/12 0818 06/11/12 0449 06/11/12 0151 06/10/12 2002 06/10/12 1651  HGB -- -- 13.1 -- -- 14.0  HCT -- -- 38.6 -- -- 43.0  PLT -- -- 256 -- -- 284  APTT -- -- -- -- -- --  LABPROT -- -- 21.3* -- -- 21.0*  INR -- -- 1.93* -- -- 1.89*  HEPARINUNFRC 0.26* -- 0.20* -- -- --  CREATININE -- -- -- -- 0.88 1.03  CKTOTAL -- -- -- -- -- --  CKMB -- -- -- -- -- --  TROPONINI -- <0.30 -- <0.30 <0.30 --    Estimated Creatinine Clearance: 51.2 ml/min (by C-G formula based on Cr of 0.88).  Assessment: 76 yr old female with Aflutter on coumadin and heparin. Heparin level today is 0.26 after an increase to 1200 units/hr.  Home coumadin dose: 2mg  daily except for 3mg  on MWF.  Goal of Therapy:  INR 2-3 Heparin level 0.3-0.7 units/ml Monitor platelets by anticoagulation protocol: Yes   Plan:  Increase Heparin 1350 units/hr Check heparin level in 8 hours.  Harland German, Pharm D 06/11/2012 3:41 PM

## 2012-06-11 NOTE — Progress Notes (Signed)
ANTICOAGULATION CONSULT NOTE  Pharmacy Consult for Heparin and Coumadin Indication: chest pain/ACS and atrial fibrillation  Allergies  Allergen Reactions  . Clarithromycin     REACTION: nausea    Patient Measurements: Height: 5\' 5"  (165.1 cm) Weight: 148 lb 2.4 oz (67.2 kg) IBW/kg (Calculated) : 57  Heparin Dosing Weight: 67.2 kg  Vital Signs: Temp: 98 F (36.7 C) (09/24 0000) Temp src: Oral (09/24 0000) BP: 115/53 mmHg (09/24 0400) Pulse Rate: 108  (09/24 0500)  Labs:  Basename 06/11/12 0449 06/11/12 0151 06/10/12 2002 06/10/12 1651  HGB 13.1 -- -- 14.0  HCT 38.6 -- -- 43.0  PLT 256 -- -- 284  APTT -- -- -- --  LABPROT 21.3* -- -- 21.0*  INR 1.93* -- -- 1.89*  HEPARINUNFRC 0.20* -- -- --  CREATININE -- -- 0.88 1.03  CKTOTAL -- -- -- --  CKMB -- -- -- --  TROPONINI -- <0.30 <0.30 <0.30    Estimated Creatinine Clearance: 51.2 ml/min (by C-G formula based on Cr of 0.88).  Assessment: 76 yr old female with Aflutter for anticoagulation  Goal of Therapy:  INR 2-3 Heparin level 0.3-0.7 units/ml Monitor platelets by anticoagulation protocol: Yes   Plan:  Increase Heparin 1200 units/hr Check heparin level in 8 hours. Coumadin 4 tonight  Eddie Candle 06/11/2012,5:48 AM

## 2012-06-11 NOTE — Consult Note (Signed)
ELECTROPHYSIOLOGY CONSULT NOTE     Primary Care Physician: Georgann Housekeeper, MD Referring Physician:  Armanda Magic  Admit Date: 06/10/2012  Reason for consultation:  Atrial flutter  [name] is a 76 y.o. female with a h/o COPD on chronic home oxygen, sleep apnea, and atrial flutter now admitted with recurrent atrial flutter.  She was previously seen by me in consultation 9/12.  Please refer to my note from that time.   The patient previously presented to [facility] in January 2012 with supraventricular tachycardia, which was treated with adenosine. Per the discharge summary, the adenosine had no effect on the arrhythmia, and the patient therefore was placed on Cardizem drip, which then converted her to sinus rhythm.  She presented January 16, 2011 with typical appearing atrial flutter with ventricular rates in the 150s. She was placed on IV Cardizem and overnight converted from atrial flutter to sinus rhythm. She was discharged and returned 03/15/11 with 2:1 atrial flutter which terminated with cardizem.  Upon being initially evaluated by me a year ago, she declined ablation due to concerns of respiratory failure and increased risks with the procedure.   She seems to have done well over the past year. Unfortunately, she now returns with symptomatic atrial flutter with 2:1 AV conduction.  She was placed on a diltiazem drip yesterday and has since converted back to sinus rhythm.  Today, she denies symptoms of chest pain, shortness of breath (above baseline), lower extremity edema, dizziness, presyncope, syncope, or neurologic sequela. The patient is tolerating medications without difficulties and is otherwise without complaint today.    Past Medical History  Diagnosis Date  . Unspecified sinusitis (chronic)   . Obstructive sleep apnea   . COPD (chronic obstructive pulmonary disease)     on home O2, chronic steroid use  . Asthma   . Lung nodule   . SVT (supraventricular tachycardia)   . Atrial  flutter     typical appearing  . Hypothyroidism   . GERD (gastroesophageal reflux disease)   . Anxiety   . Depression   . History of radiation therapy 11/08/2009-11/18/2009    left upper lung  . Allergy   . Cancer     lung ca s/p XRT 2011  . Lung cancer     non-small cell ca let upper lung stage IA   Past Surgical History  Procedure Date  . Hysterectomy     abdominal  . Tonsillectomy   . Cystectomy     removal from scalp  . Bladder repair     bladder tack  . Appendectomy   . Lung biopsy     Needle bx lung nodule - atypical cells- complicated by PTX/ chest tube  . Abdominal hysterectomy        . buPROPion  150 mg Oral BID  . diltiazem  360 mg Oral Daily  . diltiazem  20 mg Intravenous Once  . diltiazem  20 mg Intravenous Once  . estrogens (conjugated)  0.625 mg Oral Daily  . fluticasone  2 spray Each Nare Daily  . Fluticasone-Salmeterol  1 puff Inhalation BID  . predniSONE  5 mg Oral Q breakfast  . sodium chloride  3 mL Intravenous Q12H  . theophylline  150 mg Oral Q12H  . tiotropium  18 mcg Inhalation Daily  . warfarin  3 mg Oral Once  . warfarin  4 mg Oral ONCE-1800  . Warfarin - Pharmacist Dosing Inpatient   Does not apply q1800      . sodium  chloride 10 mL/hr (06/10/12 2259)  . heparin 1,350 Units/hr (06/11/12 1637)  . DISCONTD: diltiazem (CARDIZEM) infusion Stopped (06/11/12 1229)  . DISCONTD: heparin 1,200 Units/hr (06/11/12 0800)    Allergies  Allergen Reactions  . Clarithromycin     REACTION: nausea    History   Social History  . Marital Status: Married    Spouse Name: N/A    Number of Children: 2  . Years of Education: N/A   Occupational History  . Retired    Social History Main Topics  . Smoking status: Former Smoker -- 2.0 packs/day for 40 years    Types: Cigarettes    Quit date: 09/18/1986  . Smokeless tobacco: Never Used  . Alcohol Use: No  . Drug Use: No  . Sexually Active: Not on file   Other Topics Concern  . Not on file    Social History Narrative   Patient states former smoker.  Quit 1988.RetiredMarried with 2 children    Family History  Problem Relation Age of Onset  . Heart disease Mother 75    ROS- All systems are reviewed and negative except as per the HPI above  Physical Exam: Telemetry: Filed Vitals:   06/11/12 1500 06/11/12 1600 06/11/12 1657 06/11/12 1700  BP: 124/56 136/57  110/74  Pulse: 99 92  93  Temp:   98.4 F (36.9 C)   TempSrc:   Oral   Resp: 21 24  25   Height:      Weight:      SpO2: 95% 98%  95%    GEN- The patient is chronically ill appearing, alert and oriented x 3 today.  Wears O2 Head- normocephalic, atraumatic Eyes-  Sclera clear, conjunctiva pink Ears- hearing intact Oropharynx- clear Neck- supple  Lungs- diffuse expiratory wheezes with decreased air movement throughout Heart- Regular rate and rhythm  GI- soft, NT, ND, + BS Extremities- no clubbing, cyanosis, or edema MS- diffuse muscle atrophy Skin- no rash or lesion Psych- euthymic mood, full affect Neuro- strength and sensation are intact  EKGs are reviewed and reveal atrial flutter with 2:1 AV conduction  Labs:   Lab Results  Component Value Date   WBC 10.7* 06/11/2012   HGB 13.1 06/11/2012   HCT 38.6 06/11/2012   MCV 89.4 06/11/2012   PLT 256 06/11/2012    Lab 06/10/12 2002  NA 137  K 3.8  CL 102  CO2 24  BUN 18  CREATININE 0.88  CALCIUM 9.2  PROT 6.7  BILITOT 0.3  ALKPHOS 93  ALT 17  AST 16  GLUCOSE 133*   Lab Results  Component Value Date   CKTOTAL 125 03/15/2011   CKMB 8.2* 03/15/2011   TROPONINI <0.30 06/11/2012       Radiology: reviewed  Echo: pending  ASSESSMENT AND PLAN:  1.  Atrial flutter- recurrent despite medical therapy with diltiazem.  Given her comorbidities, she is a poor candidate for antiarrhythmics at this time.  By ekg, her atrial flutter is likely isthmus dependant.  She also carries a diagnosis of SVT previously. Therapeutic strategies for supraventricular  tachycardia and atrial flutter including medicine and ablation were discussed in detail with the patient today. Risk, benefits, and alternatives to EP study and radiofrequency ablation were also discussed in detail today. These risks include but are not limited to stroke, bleeding, vascular damage, tamponade, perforation, damage to the heart and other structures, AV block requiring pacemaker, worsening renal function, and death.  She understands that her risks are increased given her chronic  lung disease.  The patient understands these risk and wishes to proceed.  We will therefore proceed with catheter ablation later this week.  I have tentatively placed her on the schedule for ablation with anesthesia's assistance on Thursday. Continue coumadin and stop heparin once INR >1.9.  2. Chronic lung disease- primary team to optimize respiratory status for ablation.  We will try to minimize sedation for the ablation, though the patient recognizes that she will require at least moderate if not deep sedation for this procedure.    Hillis Range, MD 06/11/2012  5:28 PM

## 2012-06-11 NOTE — Progress Notes (Signed)
At 0325, pt converted herself into afib/aflutter from NSR. Pt asymptomatic VSS; increased cardizem gtt to 15.  Will continue to monitor closely and update as needed

## 2012-06-11 NOTE — Clinical Documentation Improvement (Signed)
RESPIRATORY FAILURE DOCUMENTATION CLARIFICATION QUERY   THIS DOCUMENT IS NOT A PERMANENT PART OF THE MEDICAL RECORD  TO RESPOND TO THE THIS QUERY, FOLLOW THE INSTRUCTIONS BELOW:  1. If needed, update documentation for the patient's encounter via the notes activity.  2. Access this query again and click edit on the In [employer].  3. After updating, or not, click F2 to complete all highlighted (required) fields concerning your review. Select "additional documentation in the medical record" OR "no additional documentation provided".  4. Click Sign note button.  5. The deficiency will fall out of your In Basket *Please let us know if you are not able to complete this workflow by phone or e-mail (listed below).  Please update your documentation within the medical record to reflect your response to this query.                                                                                    06/11/12  Dear Elwin Sleight Marton Redwood,  In a better effort to capture your patient's severity of illness, reflect appropriate length of stay and utilization of resources, a review of the patient medical record has revealed the following indicators.    Based on your clinical judgment, please clarify and document in a progress note and/or discharge summary the clinical condition associated with the following supporting information:  In responding to this query please exercise your independent judgment.  The fact that a query is asked, does not imply that any particular answer is desired or expected.   Possible Clinical Conditions?  _______Acute on Chronic Respiratory Failure  _XX______Chronic Respiratory Failure  _______Other Condition  _______Cannot Clinically Determine    Risk Factors: Patient with history of severe COPD, OSA, and is on home O2 per 9/23 progress notes.  Treatment: Oxygen: Per doc flowsheets O2 at 4L per n/c with O2 sats of 94-98%   You may use possible, probable, or  suspect with inpatient documentation. possible, probable, suspected diagnoses MUST be documented at the time of discharge  Reviewed: additional documentation in the medical record  Thank You,  Marciano Sequin,  Clinical Documentation Specialist:  Pager: [phone]  Health Information Management [place]

## 2012-06-11 NOTE — Progress Notes (Signed)
Around 0610, pt converted herself back into NSR from afib/aflutter; pt resting; will continue to monitor closely and update as needed

## 2012-06-12 ENCOUNTER — Telehealth: Payer: Self-pay | Admitting: Internal Medicine

## 2012-06-12 LAB — PROTIME-INR
INR: 2.99 — ABNORMAL HIGH (ref 0.00–1.49)
Prothrombin Time: 29.5 seconds — ABNORMAL HIGH (ref 11.6–15.2)

## 2012-06-12 MED ORDER — POTASSIUM CHLORIDE CRYS ER 20 MEQ PO TBCR
EXTENDED_RELEASE_TABLET | ORAL | Status: AC
  Filled 2012-06-12: qty 2

## 2012-06-12 MED ORDER — POTASSIUM CHLORIDE CRYS ER 20 MEQ PO TBCR
40.0000 meq | EXTENDED_RELEASE_TABLET | Freq: Once | ORAL | Status: AC
  Administered 2012-06-12: 40 meq via ORAL

## 2012-06-12 MED ORDER — FUROSEMIDE 10 MG/ML IJ SOLN
INTRAMUSCULAR | Status: AC
  Filled 2012-06-12: qty 4

## 2012-06-12 MED ORDER — SODIUM CHLORIDE 0.9 % IV SOLN: INTRAVENOUS | Status: DC

## 2012-06-12 MED ORDER — FUROSEMIDE 10 MG/ML IJ SOLN
20.0000 mg | Freq: Once | INTRAMUSCULAR | Status: AC
  Administered 2012-06-12: 20 mg via INTRAVENOUS

## 2012-06-12 MED ORDER — DILTIAZEM HCL 100 MG IV SOLR
5.0000 mg/h | INTRAVENOUS | Status: DC
  Administered 2012-06-12 – 2012-06-13 (×2): 15 mg/h via INTRAVENOUS
  Filled 2012-06-12: qty 100

## 2012-06-12 MED ORDER — DILTIAZEM LOAD VIA INFUSION
10.0000 mg | Freq: Once | INTRAVENOUS | Status: AC
  Administered 2012-06-12: 10 mg via INTRAVENOUS
  Filled 2012-06-12: qty 10

## 2012-06-12 NOTE — Progress Notes (Signed)
SUBJECTIVE: The patient is doing well today.  At this time, she denies chest pain, shortness of breath, or any new concerns.  She just returned to atrial flutter several minutes ago.     Marland Kitchen buPROPion  150 mg Oral BID  . estrogens (conjugated)  0.625 mg Oral Daily  . fluticasone  2 spray Each Nare Daily  . Fluticasone-Salmeterol  1 puff Inhalation BID  . predniSONE  5 mg Oral Q breakfast  . sodium chloride  3 mL Intravenous Q12H  . theophylline  150 mg Oral Q12H  . tiotropium  18 mcg Inhalation Daily  . warfarin  4 mg Oral ONCE-1800  . Warfarin - Pharmacist Dosing Inpatient   Does not apply q1800  . DISCONTD: diltiazem  360 mg Oral Daily      . sodium chloride Stopped (06/12/12 0000)  . diltiazem (CARDIZEM) infusion    . DISCONTD: diltiazem (CARDIZEM) infusion Stopped (06/11/12 1229)  . DISCONTD: heparin 1,200 Units/hr (06/11/12 0800)  . DISCONTD: heparin Stopped (06/12/12 0000)    OBJECTIVE: Physical Exam: Filed Vitals:   06/12/12 0500 06/12/12 0600 06/12/12 0700 06/12/12 0817  BP: 127/58  124/66   Pulse: 77 80 82   Temp:    97.9 F (36.6 C)  TempSrc:    Oral  Resp: 21     Height:      Weight:      SpO2: 96% 97% 99%     Intake/Output Summary (Last 24 hours) at 06/12/12 0825 Last data filed at 06/12/12 0600  Gross per 24 hour  Intake 841.92 ml  Output   2150 ml  Net -1308.08 ml    Telemetry reveals sinus rhythm overnight, now with 2:1 atrial flutter GEN- The patient is chronically ill appearing, alert and oriented x 3 today.   Head- normocephalic, atraumatic Eyes-  Sclera clear, conjunctiva pink Ears- hearing intact Oropharynx- clear Lungs- decreased BS throughout, prolonged expiratory phase, normal work of breathing Heart- irregular rate and rhythm,   Extremities- no clubbing, cyanosis, or edema   LABS: Basic Metabolic Panel:  Basename 06/10/12 2002 06/10/12 1651  NA 137 140  K 3.8 3.9  CL 102 99  CO2 24 27  GLUCOSE 133* 125*  BUN 18 20    CREATININE 0.88 1.03  CALCIUM 9.2 10.1  MG -- --  PHOS -- --   Liver Function Tests:  [facility] 06/10/12 2002  AST 16  ALT 17  ALKPHOS 93  BILITOT 0.3  PROT 6.7  ALBUMIN 3.0*   No results found for this basename: LIPASE:2,AMYLASE:2 in the last 72 hours CBC:  Basename 06/11/12 0449 06/10/12 1651  WBC 10.7* 10.9*  NEUTROABS -- 8.9*  HGB 13.1 14.0  HCT 38.6 43.0  MCV 89.4 89.6  PLT 256 284   Cardiac Enzymes:  Basename 06/11/12 0818 06/11/12 0151 06/10/12 2002  CKTOTAL -- -- --  CKMB -- -- --  CKMBINDEX -- -- --  TROPONINI <0.30 <0.30 <0.30    Thyroid Function Tests:  Basename 06/10/12 2002  TSH 2.950  T4TOTAL --  T3FREE --  THYROIDAB --   Anemia Panel: No results found for this basename: VITAMINB12,FOLATE,FERRITIN,TIBC,IRON,RETICCTPCT in the last 72 hours  ASSESSMENT AND PLAN:  Active Problems:  OBSTRUCTIVE SLEEP APNEA  SVT (supraventricular tachycardia)  COPD  Atrial flutter  1. Atrial flutter- recurrent despite medical therapy with diltiazem. Therapeutic strategies for supraventricular tachycardia and atrial flutter including medicine and ablation were again discussed in detail with the patient today. Risk, benefits, and alternatives to EP  study and radiofrequency ablation were also discussed in detail today. These risks include but are not limited to stroke, bleeding, vascular damage, tamponade, perforation, damage to the heart and other structures, AV block requiring pacemaker, worsening renal function, and death. She understands that her risks are increased given her chronic lung disease. The patient understands these risk and wishes to proceed.  I have tentatively placed her on the schedule for ablation with anesthesia's assistance on Thursday.  I will restart diltiazem drip in the interim. Hold coumadin tonight due to abrupt rise in INR  2. Chronic lung disease- primary team to optimize respiratory status for ablation. We will try to minimize sedation  for the ablation, though the patient recognizes that she will require at least moderate if not deep sedation for this procedure.    Hillis Range, MD 06/12/2012 8:25 AM

## 2012-06-12 NOTE — Progress Notes (Signed)
SUBJECTIVE:  Converted to NSR.  Back in flutter this am  OBJECTIVE:   Vitals:   Filed Vitals:   06/12/12 0700 06/12/12 0817 06/12/12 0840 06/12/12 0853  BP: 124/66   113/64  Pulse: 82  150 156  Temp: 97.9 F (36.6 C) 97.9 F (36.6 C)    TempSrc: Oral Oral    Resp: 16  24 19   Height:      Weight:      SpO2: 99%   97%   I&O's:    Intake/Output Summary (Last 24 hours) at 06/12/12 0859 Last data filed at 06/12/12 0855  Gross per 24 hour  Intake 971.92 ml  Output   2500 ml  Net -1528.08 ml   TELEMETRY: Reviewed telemetry pt in atrial flutter     PHYSICAL EXAM General: Well developed, well nourished, in no acute distress Head:   Normal cephalic and atramatic  Lungs:   Scant wheezing  Heart:    S1 S2 tachycardic Abdomen: obese Extremities:   No  edema.  DP +1 Neuro: Alert and oriented X 3. Psych:  normal affect, responds appropriately   LABS: Basic Metabolic Panel:  Basename 06/10/12 2002 06/10/12 1651  NA 137 140  K 3.8 3.9  CL 102 99  CO2 24 27  GLUCOSE 133* 125*  BUN 18 20  CREATININE 0.88 1.03  CALCIUM 9.2 10.1  MG -- --  PHOS -- --   Liver Function Tests:  [facility] 06/10/12 2002  AST 16  ALT 17  ALKPHOS 93  BILITOT 0.3  PROT 6.7  ALBUMIN 3.0*   No results found for this basename: LIPASE:2,AMYLASE:2 in the last 72 hours CBC:  Basename 06/11/12 0449 06/10/12 1651  WBC 10.7* 10.9*  NEUTROABS -- 8.9*  HGB 13.1 14.0  HCT 38.6 43.0  MCV 89.4 89.6  PLT 256 284   Cardiac Enzymes:  Basename 06/11/12 0818 06/11/12 0151 06/10/12 2002  CKTOTAL -- -- --  CKMB -- -- --  CKMBINDEX -- -- --  TROPONINI <0.30 <0.30 <0.30   Thyroid Function Tests:  Basename 06/10/12 2002  TSH 2.950  T4TOTAL --  T3FREE --  THYROIDAB --   Anemia Panel: No results found for this basename: VITAMINB12,FOLATE,FERRITIN,TIBC,IRON,RETICCTPCT in the last 72 hours Coag Panel:   Lab Results  Component Value Date   INR 2.99* 06/12/2012   INR 1.93* 06/11/2012   INR  1.89* 06/10/2012    RADIOLOGY: Dg Chest 2 View  06/10/2012  *RADIOLOGY REPORT*  Clinical Data:  Fall   CHEST - 2 VIEW  Comparison: CT thorax 05/30/2012  Findings: Stable cardiac silhouette.  No evidence of pulmonary contusion, pleural fluid, no pneumothorax.  The patient's chin does obscure upper thorax.  No evidence of fracture.  Lungs are hyperinflated.  There is a compression fracture of the upper thoracic spine as well as the lower thoracic spine which is similar to comparison CT.  IMPRESSION:  1.  No radiographic evidence of acute thoracic trauma. 2.  Emphysematous change. 3.  Chronic compression fracture of the thoracic spine.   Original Report Authenticated By: Genevive Bi, M.D.    Ct Head Wo Contrast  06/10/2012  *RADIOLOGY REPORT*  Clinical Data:  76 year old female status post fall.  History of lung cancer.  CT HEAD WITHOUT CONTRAST CT CERVICAL SPINE WITHOUT CONTRAST  Technique:  Multidetector CT imaging of the head and cervical spine was performed following the standard protocol without intravenous contrast.  Multiplanar CT image reconstructions of the cervical spine were also generated.  Comparison:  Chest CT 05/30/2012.  Head CT 07/10/2005 paranasal sinus CT 05/03/2005.  CT HEAD  Findings: No focal scalp hematoma identified.  No acute orbit soft tissue findings seen.  Minimal left ethmoid air cell mucosal thickening.  Calvarium intact with no suspicious osseous lesion.  Mild generalized cerebral volume loss since 2006.  No ventriculomegaly.  Patchy confluent bilateral white matter hypodensity is new. No midline shift, mass effect, or evidence of mass lesion.  No acute intracranial hemorrhage identified.  No evidence of cortically based acute infarction identified.  IMPRESSION: 1.  No acute traumatic injury identified to the head. 2.  Mild generalized cerebral volume loss and moderate nonspecific white matter changes since 2006. 3.  Cervical spine findings are below.  CT CERVICAL SPINE   Findings: Straightening of cervical lordosis. Visualized skull base is intact.  No atlanto-occipital dissociation.  Trace anterolisthesis of C6 on C7. Widespread severe cervical facet hypertrophy and degeneration. Bilateral posterior element alignment is within normal limits.  Trace anterolisthesis of C7 on T1. No acute cervical fracture identified.  Chronic heterogeneous sclerosis and collapse of the T2 vertebra is stable from the recent comparison.  T3 heterogeneity is partially visible.  Severe bullous emphysema in the right apex is stable.  Chronic scarring consolidation and medial left apex appears stable.  Stable calcified right thyroid nodule.  Other Visualized paraspinal soft tissues are within normal limits.  IMPRESSION: 1. No acute fracture identified in the cervical spine.  Ligamentous injury is not excluded.  2.  Advance cervical facet degeneration. 3.  Stable pathologic appearing compression fracture of T2.   Original Report Authenticated By: Harley Hallmark, M.D.    Ct Chest Wo Contrast  05/30/2012  *RADIOLOGY REPORT*  Clinical Data: Lung cancer status post SBRT.  Restaging scan.  CT CHEST WITHOUT CONTRAST  Technique:  Multidetector CT imaging of the chest was performed following the standard protocol without IV contrast.  Comparison: PET CT 02/13/2012.  Chest CT 12/13/2011.  Findings:  Mediastinum: Heart size is normal. There is no significant pericardial fluid, thickening or pericardial calcification. There is atherosclerosis of the thoracic aorta, the great vessels of the mediastinum and the coronary arteries, including calcified atherosclerotic plaque in the left main and left circumflex coronary arteries. No pathologically enlarged mediastinal or hilar lymph nodes. Please note that accurate exclusion of hilar adenopathy is limited on noncontrast CT scans.  The esophagus is unremarkable in appearance.  Lungs/Pleura:  Over the prior several examinations there has been progressive architectural  distortion in the medial aspect of the left apex where there is again some focal pleural based soft tissue thickening.  At this time, there appears to be some developing calcifications within this region of soft tissue thickening.  The appearance of this region is nonspecific, and is similar in size to prior examinations measuring up to 2.3 x 1.5 cm; it is uncertain whether not this represents local recurrence of disease, or simply progressive nodular pleural-based fibrosis and scarring.  Scarring in the inferior segment of the lingula is unchanged.  No other definite new or enlarging suspicious appearing pulmonary nodules or masses identified.  Linear opacity in the periphery of the right lower lobe is most compatible with scarring.  Background of moderate - severe centrilobular and paraseptal emphysema with right apical bullous changes and right apical architectural distortion and pleural parenchymal scarring is similar to prior examinations. No acute consolidative airspace disease.  No pleural effusions.  Upper Abdomen: 1.8 cm ow-attenuation lesion in the lateral aspect of the interpolar region of  the left kidney is not characterized on this noncontrast CT examination, but is similar to prior examinations and likely to represent a cyst.  Atherosclerosis.  Musculoskeletal: Old compression fractures at T2, T3 and T12 are unchanged.  Sclerosis of T2 and T3 may suggest some post radiation osteonecrosis (unchanged).There are no aggressive appearing lytic or blastic lesions noted in the visualized portions of the skeleton.  IMPRESSION: 1.  The irregular shaped nodular pleural-based opacity in the apex of the left upper lobe is very similar to recent prior studies, measuring approximately 2.3 x 1.5 cm on today's examination.  Given the relative stability in size over the prior 74-month time interval, this is favored to represent evolving nodular appearing postradiation fibrosis, rather than a true local recurrence.  However, continued attention on follow-up studies is recommended. No new pulmonary nodules, and no evidence of mediastinal or hilar lymphadenopathy is identified on today's examination. 2.  Other incidental findings, as above.   Original Report Authenticated By: Florencia Reasons, M.D.    Ct Cervical Spine Wo Contrast  06/10/2012  *RADIOLOGY REPORT*  Clinical Data:  76 year old female status post fall.  History of lung cancer.  CT HEAD WITHOUT CONTRAST CT CERVICAL SPINE WITHOUT CONTRAST  Technique:  Multidetector CT imaging of the head and cervical spine was performed following the standard protocol without intravenous contrast.  Multiplanar CT image reconstructions of the cervical spine were also generated.  Comparison:  Chest CT 05/30/2012.  Head CT 07/10/2005 paranasal sinus CT 05/03/2005.  CT HEAD  Findings: No focal scalp hematoma identified.  No acute orbit soft tissue findings seen.  Minimal left ethmoid air cell mucosal thickening.  Calvarium intact with no suspicious osseous lesion.  Mild generalized cerebral volume loss since 2006.  No ventriculomegaly.  Patchy confluent bilateral white matter hypodensity is new. No midline shift, mass effect, or evidence of mass lesion.  No acute intracranial hemorrhage identified.  No evidence of cortically based acute infarction identified.  IMPRESSION: 1.  No acute traumatic injury identified to the head. 2.  Mild generalized cerebral volume loss and moderate nonspecific white matter changes since 2006. 3.  Cervical spine findings are below.  CT CERVICAL SPINE  Findings: Straightening of cervical lordosis. Visualized skull base is intact.  No atlanto-occipital dissociation.  Trace anterolisthesis of C6 on C7. Widespread severe cervical facet hypertrophy and degeneration. Bilateral posterior element alignment is within normal limits.  Trace anterolisthesis of C7 on T1. No acute cervical fracture identified.  Chronic heterogeneous sclerosis and collapse of the T2  vertebra is stable from the recent comparison.  T3 heterogeneity is partially visible.  Severe bullous emphysema in the right apex is stable.  Chronic scarring consolidation and medial left apex appears stable.  Stable calcified right thyroid nodule.  Other Visualized paraspinal soft tissues are within normal limits.  IMPRESSION: 1. No acute fracture identified in the cervical spine.  Ligamentous injury is not excluded.  2.  Advance cervical facet degeneration. 3.  Stable pathologic appearing compression fracture of T2.   Original Report Authenticated By: Harley Hallmark, M.D.    Dg Shoulder Left  06/10/2012  *RADIOLOGY REPORT*  Clinical Data: Fall and left shoulder pain.  LEFT SHOULDER - 2+ VIEW  Comparison: Chest radiograph 06/10/2012  Findings: Three views of the left shoulder are negative for fracture or dislocation.  Degenerative facet changes in the cervical spine.  The left AC joint is intact.  IMPRESSION: No acute bony abnormality.   Original Report Authenticated By: Richarda Overlie, M.D.  ASSESSMENT:  1.  Atrial flutter with RVR  2.  Systemic anticoagulation with therapuetic INR 3.  OSA 4.  Severe COPD on home O2 5.  Stage I lung CA s/p XRT 6.  GERD 7.  Hypothyroidism  PLAN:   1.  Flutter ablation tomorrow pending INR 2.  Hold coumadin 3.  Restart Cardizem gtt 4.  Lasix 20 mg IV to treat diastolic dysfunction.  Corky Crafts., MD  06/12/2012  8:59 AM

## 2012-06-12 NOTE — Progress Notes (Signed)
Pt remains in aflutter/fib with HR at 140-155. MD and NP made aware. Pt asymptomatic. Cardizem drip at 15. VSS. Will continue to monitor.

## 2012-06-12 NOTE — Telephone Encounter (Signed)
Called spoke with patient who reported she is scheduled for an ablation tomorrow (currently admitted for a-flutter ).  As CY is not in the office today and pt is tentatively scheduled for ablation tomorrow, advised pt to ask her nurse to request a pulmonary consultation to provide clearance for pt.  Pt okay with this recommendation and verbalized her understanding.  Katie aware of this.  Will sign and forward to CY as FYI.

## 2012-06-12 NOTE — Progress Notes (Signed)
ANTICOAGULATION CONSULT NOTE  Pharmacy Consult for Coumadin Indication: atrial flutter   Allergies  Allergen Reactions  . Clarithromycin     REACTION: nausea    Patient Measurements: Height: 5\' 5"  (165.1 cm) Weight: 148 lb 2.4 oz (67.2 kg) IBW/kg (Calculated) : 57  Heparin Dosing Weight: 67.2 kg  Vital Signs: Temp: 97.9 F (36.6 C) (09/25 0817) Temp src: Oral (09/25 0817) BP: 123/73 mmHg (09/25 0900) Pulse Rate: 156  (09/25 0853)  Labs:  Basename 06/12/12 0440 06/11/12 1341 06/11/12 0818 06/11/12 0449 06/11/12 0151 06/10/12 2002 06/10/12 1651  HGB -- -- -- 13.1 -- -- 14.0  HCT -- -- -- 38.6 -- -- 43.0  PLT -- -- -- 256 -- -- 284  APTT -- -- -- -- -- -- --  LABPROT 29.5* -- -- 21.3* -- -- 21.0*  INR 2.99* -- -- 1.93* -- -- 1.89*  HEPARINUNFRC -- 0.26* -- 0.20* -- -- --  CREATININE -- -- -- -- -- 0.88 1.03  CKTOTAL -- -- -- -- -- -- --  CKMB -- -- -- -- -- -- --  TROPONINI -- -- <0.30 -- <0.30 <0.30 --    Estimated Creatinine Clearance: 51.2 ml/min (by C-G formula based on Cr of 0.88).  Assessment: 76 yr old female with Aflutter on Coumadin. INR this morning has jumped to 2.99<1.93. Per Cardiology, will hold Coumadin tonight. Planning for flutter ablation possibly tomorrow pending INR. CBC stable from 9/24, no note of bleeding   Home coumadin dose: 2mg  daily except for 3mg  on MWF.  Goal of Therapy:  INR 2-3   Plan:  - Hold Coumadin tonight - INR in am   Thank you, [employer], Pharm.D. Clinical Pharmacist   Pager: [phone] 06/12/2012 10:12 AM

## 2012-06-12 NOTE — ED Provider Notes (Signed)
I saw and evaluated the patient, reviewed the resident's note and I agree with the findings and plan. I saw and evaluated the EKG and agree with the resident's interpretation Patient with a mechanical fall 4 days ago however worsening palpitations within the last 2 days and decided to come and be evaluated. On exam patient was found to have a flutter with a rate between 1:30 and 150. She was started on a Cardizem drip which a bolus of 20 initially controlled her heart rate however it started drifting back up. She required a second Cardizem bolus and cardiology was consult in. She had no evidence of underlying injury despite extensive bruising from her fall.  CRITICAL CARE Performed by: Gwyneth Sprout   Total critical care time: 30  Critical care time was exclusive of separately billable procedures and treating other patients.  Critical care was necessary to treat or prevent imminent or life-threatening deterioration.  Critical care was time spent personally by me on the following activities: development of treatment plan with patient and/or surrogate as well as nursing, discussions with consultants, evaluation of patient's response to treatment, examination of patient, obtaining history from patient or surrogate, ordering and performing treatments and interventions, ordering and review of laboratory studies, ordering and review of radiographic studies, pulse oximetry and re-evaluation of patient's condition.   Gwyneth Sprout, MD 06/12/12 [phone]

## 2012-06-13 ENCOUNTER — Ambulatory Visit (HOSPITAL_COMMUNITY): Admission: RE | Admit: 2012-06-13 | Payer: Medicare Other | Source: Ambulatory Visit | Admitting: Internal Medicine

## 2012-06-13 ENCOUNTER — Encounter (HOSPITAL_COMMUNITY)
Admission: EM | Disposition: A | Discharge status: Home / Self Care | Payer: Self-pay | Source: Home / Self Care | Attending: Interventional Cardiology

## 2012-06-13 ENCOUNTER — Encounter (HOSPITAL_COMMUNITY): Payer: Self-pay | Admitting: Anesthesiology

## 2012-06-13 ENCOUNTER — Inpatient Hospital Stay (HOSPITAL_COMMUNITY): Payer: Medicare Other | Admitting: Anesthesiology

## 2012-06-13 DIAGNOSIS — I4892 Unspecified atrial flutter: Secondary | ICD-10-CM

## 2012-06-13 HISTORY — PX: ATRIAL FLUTTER ABLATION: SHX5733

## 2012-06-13 HISTORY — PX: OTHER SURGICAL HISTORY: SHX169

## 2012-06-13 LAB — CBC
MCHC: 33.8 g/dL (ref 30.0–36.0)
RDW: 13.5 % (ref 11.5–15.5)

## 2012-06-13 LAB — BASIC METABOLIC PANEL
BUN: 18 mg/dL (ref 6–23)
Creatinine, Ser: 0.85 mg/dL (ref 0.50–1.10)
GFR calc Af Amer: 77 mL/min — ABNORMAL LOW (ref >90)
GFR calc non Af Amer: 66 mL/min — ABNORMAL LOW (ref >90)

## 2012-06-13 LAB — PROTIME-INR: Prothrombin Time: 30.9 seconds — ABNORMAL HIGH (ref 11.6–15.2)

## 2012-06-13 SURGERY — ATRIAL FLUTTER ABLATION
Anesthesia: Monitor Anesthesia Care

## 2012-06-13 MED ORDER — MAGNESIUM SULFATE 50 % IJ SOLN
INTRAMUSCULAR | Status: AC
  Filled 2012-06-13: qty 6

## 2012-06-13 MED ORDER — SODIUM CHLORIDE 0.9 % IJ SOLN
3.0000 mL | Freq: Two times a day (BID) | INTRAMUSCULAR | Status: DC
  Administered 2012-06-13 – 2012-06-14 (×3): 3 mL via INTRAVENOUS

## 2012-06-13 MED ORDER — DROPERIDOL 2.5 MG/ML IJ SOLN
0.6250 mg | INTRAMUSCULAR | Status: DC | PRN
  Filled 2012-06-13: qty 0.25

## 2012-06-13 MED ORDER — IBUTILIDE FUMARATE 1 MG/10ML IV SOLN
1.0000 mg | INTRAVENOUS | Status: DC | PRN
  Administered 2012-06-13: 1 mg via INTRAVENOUS

## 2012-06-13 MED ORDER — HYDROMORPHONE HCL PF 1 MG/ML IJ SOLN: 0.2500 mg | INTRAMUSCULAR | Status: DC | PRN

## 2012-06-13 MED ORDER — FENTANYL CITRATE 0.05 MG/ML IJ SOLN
INTRAMUSCULAR | Status: DC | PRN
  Administered 2012-06-13 (×2): 25 ug via INTRAVENOUS

## 2012-06-13 MED ORDER — OXYCODONE HCL 5 MG PO TABS: 5.0000 mg | ORAL_TABLET | Freq: Once | ORAL | Status: AC | PRN

## 2012-06-13 MED ORDER — ACETAMINOPHEN 325 MG PO TABS: 650.0000 mg | ORAL_TABLET | ORAL | Status: DC | PRN

## 2012-06-13 MED ORDER — WARFARIN SODIUM 1 MG PO TABS
1.0000 mg | ORAL_TABLET | Freq: Once | ORAL | Status: AC
  Administered 2012-06-13: 1 mg via ORAL
  Filled 2012-06-13: qty 1

## 2012-06-13 MED ORDER — WARFARIN - PHARMACIST DOSING INPATIENT: Freq: Every day | Status: DC

## 2012-06-13 MED ORDER — SODIUM CHLORIDE 0.9 % IV SOLN: 250.0000 mL | INTRAVENOUS | Status: DC | PRN

## 2012-06-13 MED ORDER — ONDANSETRON HCL 4 MG/2ML IJ SOLN: 4.0000 mg | Freq: Four times a day (QID) | INTRAMUSCULAR | Status: DC | PRN

## 2012-06-13 MED ORDER — SODIUM CHLORIDE 0.9 % IJ SOLN: 3.0000 mL | INTRAMUSCULAR | Status: DC | PRN

## 2012-06-13 MED ORDER — IBUTILIDE FUMARATE 1 MG/10ML IV SOLN
INTRAVENOUS | Status: AC
  Filled 2012-06-13: qty 10

## 2012-06-13 MED ORDER — HYDROCODONE-ACETAMINOPHEN 5-325 MG PO TABS: 1.0000 | ORAL_TABLET | ORAL | Status: DC | PRN

## 2012-06-13 MED ORDER — SODIUM CHLORIDE 0.9 % IV SOLN
INTRAVENOUS | Status: AC
  Filled 2012-06-13: qty 100

## 2012-06-13 MED ORDER — OFF THE BEAT BOOK
Freq: Once | Status: AC
  Administered 2012-06-13: 13:00:00
  Filled 2012-06-13: qty 1

## 2012-06-13 MED ORDER — OXYCODONE HCL 5 MG/5ML PO SOLN: 5.0000 mg | Freq: Once | ORAL | Status: AC | PRN

## 2012-06-13 MED ORDER — PROPOFOL INFUSION 10 MG/ML OPTIME
INTRAVENOUS | Status: DC | PRN
  Administered 2012-06-13: 25 ug/kg/min via INTRAVENOUS

## 2012-06-13 MED ORDER — BUPIVACAINE HCL (PF) 0.25 % IJ SOLN
INTRAMUSCULAR | Status: AC
  Filled 2012-06-13: qty 60

## 2012-06-13 MED ORDER — SODIUM CHLORIDE 0.9 % IV SOLN
INTRAVENOUS | Status: DC | PRN
  Administered 2012-06-13 (×2): via INTRAVENOUS

## 2012-06-13 MED ORDER — MIDAZOLAM HCL 5 MG/5ML IJ SOLN
INTRAMUSCULAR | Status: DC | PRN
  Administered 2012-06-13: 1 mg via INTRAVENOUS
  Administered 2012-06-13 (×2): 0.5 mg via INTRAVENOUS

## 2012-06-13 NOTE — Progress Notes (Signed)
Cardiac monitor shows burst of afib /aflutter and then to ST 110-121  Post getting up to the bathroom , denies discomforts with slight SOB  Noted assted back to bed , kept on o2 3 l/ o2 sat 94% , HR  104-101 ST .Dr Johney Frame notified. Pt monitored closely

## 2012-06-13 NOTE — Brief Op Note (Signed)
06/10/2012 - 06/13/2012  9:46 AM  PATIENT:  [name]  76 y.o. female  PRE-OPERATIVE DIAGNOSIS:  AFlutter  POST-OPERATIVE DIAGNOSIS:  Isthmus dependant right atrial flutter, AV nodal reentrant tachycardia, atrial fibrillation  PROCEDURE:  Procedure(s) (LRB) with comments: ATRIAL FLUTTER ABLATION (N/A)  SURGEON:  Surgeon(s) and Role:    * Hillis Range, MD - Primary  PHYSICIAN ASSISTANT:   ASSISTANTS: none   ANESTHESIA:   IV sedation  EBL:  Total I/O In: 500 [I.V.:500] Out: 5 [Blood:5]  BLOOD ADMINISTERED:none  DRAINS: none   LOCAL MEDICATIONS USED:  LIDOCAINE   SPECIMEN:  No Specimen  DISPOSITION OF SPECIMEN:  N/A  COUNTS:  YES  TOURNIQUET:  * No tourniquets in log *  DICTATION: .Other Dictation: Dictation Number [id number]  PLAN OF CARE: Telemetry  PATIENT DISPOSITION:  PACU - hemodynamically stable.   Delay start of Pharmacological VTE agent (>24hrs) due to surgical blood loss or risk of bleeding: not applicable

## 2012-06-13 NOTE — Progress Notes (Signed)
SUBJECTIVE:  Intermittent AFlutter all day.  Now in NSR.  Diuresed well  OBJECTIVE:   Vitals:   Filed Vitals:   06/12/12 2314 06/13/12 0000 06/13/12 0100 06/13/12 0300  BP: 136/51 102/78 123/64 135/58  Pulse: 106 104 108 81  Temp: 98.7 F (37.1 C)     TempSrc: Oral     Resp: 20   16  Height:      Weight:      SpO2: 93% 97% 96% 98%   I&O's:    Intake/Output Summary (Last 24 hours) at 06/13/12 0536 Last data filed at 06/13/12 0400  Gross per 24 hour  Intake 1570.5 ml  Output   2400 ml  Net -829.5 ml   TELEMETRY: Reviewed telemetry pt in NSR     PHYSICAL EXAM General: Well developed, well nourished, in no acute distress Head:   Normal cephalic and atramatic  Lungs:   Scant wheezing  Heart:    S1 S2 RRR Abdomen: obese Extremities:   No  edema.   Neuro: Alert and oriented X 3. Psych:   responds appropriately   LABS: Basic Metabolic Panel:  Basename 06/10/12 2002 06/10/12 1651  NA 137 140  K 3.8 3.9  CL 102 99  CO2 24 27  GLUCOSE 133* 125*  BUN 18 20  CREATININE 0.88 1.03  CALCIUM 9.2 10.1  MG -- --  PHOS -- --   Liver Function Tests:  [facility] 06/10/12 2002  AST 16  ALT 17  ALKPHOS 93  BILITOT 0.3  PROT 6.7  ALBUMIN 3.0*   No results found for this basename: LIPASE:2,AMYLASE:2 in the last 72 hours CBC:  Basename 06/11/12 0449 06/10/12 1651  WBC 10.7* 10.9*  NEUTROABS -- 8.9*  HGB 13.1 14.0  HCT 38.6 43.0  MCV 89.4 89.6  PLT 256 284   Cardiac Enzymes:  Basename 06/11/12 0818 06/11/12 0151 06/10/12 2002  CKTOTAL -- -- --  CKMB -- -- --  CKMBINDEX -- -- --  TROPONINI <0.30 <0.30 <0.30   Thyroid Function Tests:  Basename 06/10/12 2002  TSH 2.950  T4TOTAL --  T3FREE --  THYROIDAB --   Anemia Panel: No results found for this basename: VITAMINB12,FOLATE,FERRITIN,TIBC,IRON,RETICCTPCT in the last 72 hours Coag Panel:   Lab Results  Component Value Date   INR 2.99* 06/12/2012   INR 1.93* 06/11/2012   INR 1.89* 06/10/2012     RADIOLOGY: Dg Chest 2 View  06/10/2012  *RADIOLOGY REPORT*  Clinical Data:  Fall   CHEST - 2 VIEW  Comparison: CT thorax 05/30/2012  Findings: Stable cardiac silhouette.  No evidence of pulmonary contusion, pleural fluid, no pneumothorax.  The patient's chin does obscure upper thorax.  No evidence of fracture.  Lungs are hyperinflated.  There is a compression fracture of the upper thoracic spine as well as the lower thoracic spine which is similar to comparison CT.  IMPRESSION:  1.  No radiographic evidence of acute thoracic trauma. 2.  Emphysematous change. 3.  Chronic compression fracture of the thoracic spine.   Original Report Authenticated By: Genevive Bi, M.D.    Ct Head Wo Contrast  06/10/2012  *RADIOLOGY REPORT*  Clinical Data:  76 year old female status post fall.  History of lung cancer.  CT HEAD WITHOUT CONTRAST CT CERVICAL SPINE WITHOUT CONTRAST  Technique:  Multidetector CT imaging of the head and cervical spine was performed following the standard protocol without intravenous contrast.  Multiplanar CT image reconstructions of the cervical spine were also generated.  Comparison:  Chest CT 05/30/2012.  Head CT 07/10/2005 paranasal sinus CT 05/03/2005.  CT HEAD  Findings: No focal scalp hematoma identified.  No acute orbit soft tissue findings seen.  Minimal left ethmoid air cell mucosal thickening.  Calvarium intact with no suspicious osseous lesion.  Mild generalized cerebral volume loss since 2006.  No ventriculomegaly.  Patchy confluent bilateral white matter hypodensity is new. No midline shift, mass effect, or evidence of mass lesion.  No acute intracranial hemorrhage identified.  No evidence of cortically based acute infarction identified.  IMPRESSION: 1.  No acute traumatic injury identified to the head. 2.  Mild generalized cerebral volume loss and moderate nonspecific white matter changes since 2006. 3.  Cervical spine findings are below.  CT CERVICAL SPINE  Findings: Straightening  of cervical lordosis. Visualized skull base is intact.  No atlanto-occipital dissociation.  Trace anterolisthesis of C6 on C7. Widespread severe cervical facet hypertrophy and degeneration. Bilateral posterior element alignment is within normal limits.  Trace anterolisthesis of C7 on T1. No acute cervical fracture identified.  Chronic heterogeneous sclerosis and collapse of the T2 vertebra is stable from the recent comparison.  T3 heterogeneity is partially visible.  Severe bullous emphysema in the right apex is stable.  Chronic scarring consolidation and medial left apex appears stable.  Stable calcified right thyroid nodule.  Other Visualized paraspinal soft tissues are within normal limits.  IMPRESSION: 1. No acute fracture identified in the cervical spine.  Ligamentous injury is not excluded.  2.  Advance cervical facet degeneration. 3.  Stable pathologic appearing compression fracture of T2.   Original Report Authenticated By: Harley Hallmark, M.D.    Ct Chest Wo Contrast  05/30/2012  *RADIOLOGY REPORT*  Clinical Data: Lung cancer status post SBRT.  Restaging scan.  CT CHEST WITHOUT CONTRAST  Technique:  Multidetector CT imaging of the chest was performed following the standard protocol without IV contrast.  Comparison: PET CT 02/13/2012.  Chest CT 12/13/2011.  Findings:  Mediastinum: Heart size is normal. There is no significant pericardial fluid, thickening or pericardial calcification. There is atherosclerosis of the thoracic aorta, the great vessels of the mediastinum and the coronary arteries, including calcified atherosclerotic plaque in the left main and left circumflex coronary arteries. No pathologically enlarged mediastinal or hilar lymph nodes. Please note that accurate exclusion of hilar adenopathy is limited on noncontrast CT scans.  The esophagus is unremarkable in appearance.  Lungs/Pleura:  Over the prior several examinations there has been progressive architectural distortion in the medial  aspect of the left apex where there is again some focal pleural based soft tissue thickening.  At this time, there appears to be some developing calcifications within this region of soft tissue thickening.  The appearance of this region is nonspecific, and is similar in size to prior examinations measuring up to 2.3 x 1.5 cm; it is uncertain whether not this represents local recurrence of disease, or simply progressive nodular pleural-based fibrosis and scarring.  Scarring in the inferior segment of the lingula is unchanged.  No other definite new or enlarging suspicious appearing pulmonary nodules or masses identified.  Linear opacity in the periphery of the right lower lobe is most compatible with scarring.  Background of moderate - severe centrilobular and paraseptal emphysema with right apical bullous changes and right apical architectural distortion and pleural parenchymal scarring is similar to prior examinations. No acute consolidative airspace disease.  No pleural effusions.  Upper Abdomen: 1.8 cm ow-attenuation lesion in the lateral aspect of the interpolar region of the left kidney is  not characterized on this noncontrast CT examination, but is similar to prior examinations and likely to represent a cyst.  Atherosclerosis.  Musculoskeletal: Old compression fractures at T2, T3 and T12 are unchanged.  Sclerosis of T2 and T3 may suggest some post radiation osteonecrosis (unchanged).There are no aggressive appearing lytic or blastic lesions noted in the visualized portions of the skeleton.  IMPRESSION: 1.  The irregular shaped nodular pleural-based opacity in the apex of the left upper lobe is very similar to recent prior studies, measuring approximately 2.3 x 1.5 cm on today's examination.  Given the relative stability in size over the prior 28-month time interval, this is favored to represent evolving nodular appearing postradiation fibrosis, rather than a true local recurrence. However, continued attention  on follow-up studies is recommended. No new pulmonary nodules, and no evidence of mediastinal or hilar lymphadenopathy is identified on today's examination. 2.  Other incidental findings, as above.   Original Report Authenticated By: Florencia Reasons, M.D.        Dg Shoulder Left  06/10/2012  *RADIOLOGY REPORT*  Clinical Data: Fall and left shoulder pain.  LEFT SHOULDER - 2+ VIEW  Comparison: Chest radiograph 06/10/2012  Findings: Three views of the left shoulder are negative for fracture or dislocation.  Degenerative facet changes in the cervical spine.  The left AC joint is intact.  IMPRESSION: No acute bony abnormality.   Original Report Authenticated By: Richarda Overlie, M.D.       ASSESSMENT:  1.  Atrial flutter with RVR  2.  Systemic anticoagulation with therapuetic INR 3.  OSA 4.  Severe COPD on home O2 5.  Stage I lung CA s/p XRT 6.  GERD 7.  Hypothyroidism  PLAN:   1.  Flutter ablation today pending INR 2.  Hold coumadin since INR was 2.99 3.  Cardizem gtt 4.  Lasix 20 mg IV resulted in effective diuresis.  [name]., MD  06/13/2012  5:36 AM

## 2012-06-13 NOTE — Transfer of Care (Signed)
Immediate Anesthesia Transfer of Care Note  Patient: [name]  Procedure(s) Performed: Procedure(s) (LRB) with comments: ATRIAL FLUTTER ABLATION (N/A)  Patient Location: PACU  Anesthesia Type: MAC  Level of Consciousness: awake, alert  and oriented  Airway & Oxygen Therapy: Patient Spontanous Breathing and Patient connected to nasal cannula oxygen  Post-op Assessment: Report given to PACU RN  Post vital signs: Reviewed and stable  Complications: No apparent anesthesia complications

## 2012-06-13 NOTE — Addendum Note (Signed)
Addendum  created 06/13/12 1104 by Theodosia Quay, CRNA   Modules edited:Charges VN

## 2012-06-13 NOTE — Anesthesia Postprocedure Evaluation (Signed)
Anesthesia Post Note  Patient: [name]  Procedure(s) Performed: Procedure(s) (LRB): ATRIAL FLUTTER ABLATION (N/A)  Anesthesia type: MAC  Patient location: PACU  Post pain: Pain level controlled  Post assessment: Patient's Cardiovascular Status Stable  Last Vitals:  Filed Vitals:   06/13/12 0636  BP: 156/73  Pulse: 99  Temp:   Resp:     Post vital signs: Reviewed and stable  Level of consciousness: sedated  Complications: No apparent anesthesia complications

## 2012-06-13 NOTE — Preoperative (Signed)
Beta Blockers   Reason not to administer Beta Blockers:Not Applicable 

## 2012-06-13 NOTE — Progress Notes (Signed)
Pt. Stated that she wold place herself on CPAP when ready for bed. Pt.'s CPAP is set up at bedside on 7cmH2O (per home settings).

## 2012-06-13 NOTE — H&P (View-Only) (Signed)
 SUBJECTIVE: The patient is doing well today.  At this time, she denies chest pain, shortness of breath, or any new concerns.  She just returned to atrial flutter several minutes ago.     . buPROPion  150 mg Oral BID  . estrogens (conjugated)  0.625 mg Oral Daily  . fluticasone  2 spray Each Nare Daily  . Fluticasone-Salmeterol  1 puff Inhalation BID  . predniSONE  5 mg Oral Q breakfast  . sodium chloride  3 mL Intravenous Q12H  . theophylline  150 mg Oral Q12H  . tiotropium  18 mcg Inhalation Daily  . warfarin  4 mg Oral ONCE-1800  . Warfarin - Pharmacist Dosing Inpatient   Does not apply q1800  . DISCONTD: diltiazem  360 mg Oral Daily      . sodium chloride Stopped (06/12/12 0000)  . diltiazem (CARDIZEM) infusion    . DISCONTD: diltiazem (CARDIZEM) infusion Stopped (06/11/12 1229)  . DISCONTD: heparin 1,200 Units/hr (06/11/12 0800)  . DISCONTD: heparin Stopped (06/12/12 0000)    OBJECTIVE: Physical Exam: Filed Vitals:   06/12/12 0500 06/12/12 0600 06/12/12 0700 06/12/12 0817  BP: 127/58  124/66   Pulse: 77 80 82   Temp:    97.9 F (36.6 C)  TempSrc:    Oral  Resp: 21     Height:      Weight:      SpO2: 96% 97% 99%     Intake/Output Summary (Last 24 hours) at 06/12/12 0825 Last data filed at 06/12/12 0600  Gross per 24 hour  Intake 841.92 ml  Output   2150 ml  Net -1308.08 ml    Telemetry reveals sinus rhythm overnight, now with 2:1 atrial flutter GEN- The patient is chronically ill appearing, alert and oriented x 3 today.   Head- normocephalic, atraumatic Eyes-  Sclera clear, conjunctiva pink Ears- hearing intact Oropharynx- clear Lungs- decreased BS throughout, prolonged expiratory phase, normal work of breathing Heart- irregular rate and rhythm,   Extremities- no clubbing, cyanosis, or edema   LABS: Basic Metabolic Panel:  Basename 06/10/12 2002 06/10/12 1651  NA 137 140  K 3.8 3.9  CL 102 99  CO2 24 27  GLUCOSE 133* 125*  BUN 18 20    CREATININE 0.88 1.03  CALCIUM 9.2 10.1  MG -- --  PHOS -- --   Liver Function Tests:  Basename 06/10/12 2002  AST 16  ALT 17  ALKPHOS 93  BILITOT 0.3  PROT 6.7  ALBUMIN 3.0*   No results found for this basename: LIPASE:2,AMYLASE:2 in the last 72 hours CBC:  Basename 06/11/12 0449 06/10/12 1651  WBC 10.7* 10.9*  NEUTROABS -- 8.9*  HGB 13.1 14.0  HCT 38.6 43.0  MCV 89.4 89.6  PLT 256 284   Cardiac Enzymes:  Basename 06/11/12 0818 06/11/12 0151 06/10/12 2002  CKTOTAL -- -- --  CKMB -- -- --  CKMBINDEX -- -- --  TROPONINI <0.30 <0.30 <0.30    Thyroid Function Tests:  Basename 06/10/12 2002  TSH 2.950  T4TOTAL --  T3FREE --  THYROIDAB --   Anemia Panel: No results found for this basename: VITAMINB12,FOLATE,FERRITIN,TIBC,IRON,RETICCTPCT in the last 72 hours  ASSESSMENT AND PLAN:  Active Problems:  OBSTRUCTIVE SLEEP APNEA  SVT (supraventricular tachycardia)  COPD  Atrial flutter  1. Atrial flutter- recurrent despite medical therapy with diltiazem. Therapeutic strategies for supraventricular tachycardia and atrial flutter including medicine and ablation were again discussed in detail with the patient today. Risk, benefits, and alternatives to EP   study and radiofrequency ablation were also discussed in detail today. These risks include but are not limited to stroke, bleeding, vascular damage, tamponade, perforation, damage to the heart and other structures, AV block requiring pacemaker, worsening renal function, and death. She understands that her risks are increased given her chronic lung disease. The patient understands these risk and wishes to proceed.  I have tentatively placed her on the schedule for ablation with anesthesia's assistance on Thursday.  I will restart diltiazem drip in the interim. Hold coumadin tonight due to abrupt rise in INR  2. Chronic lung disease- primary team to optimize respiratory status for ablation. We will try to minimize sedation  for the ablation, though the patient recognizes that she will require at least moderate if not deep sedation for this procedure.    Kieli Golladay, MD 06/12/2012 8:25 AM  

## 2012-06-13 NOTE — Interval H&P Note (Signed)
History and Physical Interval Note:  06/13/2012 7:33 AM  [name]  has presented today for surgery, with the diagnosis of AFlutter  The various methods of treatment have been discussed with the patient and family. After consideration of risks, benefits and other options for treatment, the patient has consented to  Procedure(s) (LRB) with comments: ATRIAL FLUTTER ABLATION (N/A) as a surgical intervention .  The patient's history has been reviewed, patient examined, no change in status, stable for surgery.  I have reviewed the patient's chart and labs.  Questions were answered to the patient's satisfaction.     Hillis Range

## 2012-06-13 NOTE — Anesthesia Preprocedure Evaluation (Addendum)
Anesthesia Evaluation  Patient identified by MRN, date of birth, ID band Patient awake    Reviewed: Allergy & Precautions, H&P , NPO status , Patient's Chart, lab work & pertinent test results, reviewed documented beta blocker date and time   Airway Mallampati: III TM Distance: >3 FB Neck ROM: Full    Dental  (+) Edentulous Lower and Partial Upper   Pulmonary asthma , sleep apnea and Continuous Positive Airway Pressure Ventilation , COPD COPD inhaler and oxygen dependent,    + decreased breath sounds      Cardiovascular + dysrhythmias Atrial Fibrillation     Neuro/Psych PSYCHIATRIC DISORDERS Anxiety Depression negative neurological ROS     GI/Hepatic GERD-  Medicated and Controlled,  Endo/Other  Hypothyroidism   Renal/GU      Musculoskeletal   Abdominal Normal abdominal exam  (+)   Peds  Hematology   Anesthesia Other Findings   Reproductive/Obstetrics                          Anesthesia Physical Anesthesia Plan  ASA: III  Anesthesia Plan: MAC and General   Post-op Pain Management:    Induction: Intravenous  Airway Management Planned: Mask, LMA and Oral ETT  Additional Equipment:   Intra-op Plan:   Post-operative Plan: Extubation in OR and Possible Post-op intubation/ventilation  Informed Consent: I have reviewed the patients History and Physical, chart, labs and discussed the procedure including the risks, benefits and alternatives for the proposed anesthesia with the patient or authorized representative who has indicated his/her understanding and acceptance.   Dental advisory given  Plan Discussed with: CRNA, Anesthesiologist and Surgeon  Anesthesia Plan Comments:        Anesthesia Quick Evaluation

## 2012-06-13 NOTE — Progress Notes (Signed)
ANTICOAGULATION CONSULT NOTE  Pharmacy Consult for Coumadin Indication: Aflutter  Allergies  Allergen Reactions  . Clarithromycin     REACTION: nausea    Patient Measurements: Height: 5\' 5"  (165.1 cm) Weight: 148 lb 2.4 oz (67.2 kg) IBW/kg (Calculated) : 57   Vital Signs: Temp: 98.2 F (36.8 C) (09/26 1055) Temp src: Oral (09/26 1055) BP: 156/73 mmHg (09/26 0636) Pulse Rate: 99  (09/26 0636)  Labs:  [name] 06/13/12 0450 06/12/12 0440 06/11/12 1341 06/11/12 0818 06/11/12 0449 06/11/12 0151 06/10/12 2002 06/10/12 1651  HGB 12.3 -- -- -- 13.1 -- -- --  HCT 36.4 -- -- -- 38.6 -- -- 43.0  PLT 253 -- -- -- 256 -- -- 284  APTT -- -- -- -- -- -- -- --  LABPROT 30.9* 29.5* -- -- 21.3* -- -- --  INR 3.19* 2.99* -- -- 1.93* -- -- --  HEPARINUNFRC -- -- 0.26* -- 0.20* -- -- --  CREATININE 0.85 -- -- -- -- -- 0.88 1.03  CKTOTAL -- -- -- -- -- -- -- --  CKMB -- -- -- -- -- -- -- --  TROPONINI -- -- -- <0.30 -- <0.30 <0.30 --    Estimated Creatinine Clearance: 53 ml/min (by C-G formula based on Cr of 0.85).  Assessment: 76 y.o. F resumed on warfarin from PTA for hx Aflutter. INR today is slightly SUPRAtherapeutic after being held on 9/25 (INR 3.19 << 2.99, goal of 2-3). The patient is s/p abalation this morning by Dr. Johney Frame. Hgb/Hct/Plt stable, no s/sx of bleeding noted.  Will resume warfarin dosing with a low dose today to keep within range now that the ablation procedure has been completed (discussed with Dr. Johney Frame). PTA the patient was known to be taking 2 mg daily EXCEPT for 3 mg on M/W/F.   Goal of Therapy:  INR 2-3   Plan:  1. Warfarin 1 mg x 1 dose at 1800 today 2. Will continue to monitor for any signs/symptoms of bleeding and will follow up with PT/INR in the a.m.   Georgina Pillion, PharmD, BCPS Clinical Pharmacist Pager: [phone] 06/13/2012 4:22 PM

## 2012-06-13 NOTE — Progress Notes (Signed)
Pt transported to cath lab on monitor by RN & cath lab tech; family at bedside; VSS; emotional support given to pt & to family

## 2012-06-14 ENCOUNTER — Telehealth: Payer: Self-pay | Admitting: Internal Medicine

## 2012-06-14 ENCOUNTER — Other Ambulatory Visit: Payer: Self-pay

## 2012-06-14 DIAGNOSIS — G4733 Obstructive sleep apnea (adult) (pediatric): Secondary | ICD-10-CM

## 2012-06-14 LAB — PROTIME-INR: INR: 2.25 — ABNORMAL HIGH (ref 0.00–1.49)

## 2012-06-14 MED ORDER — FLECAINIDE ACETATE 50 MG PO TABS
50.0000 mg | ORAL_TABLET | Freq: Two times a day (BID) | ORAL | Status: DC
  Administered 2012-06-14: 14:00:00 50 mg via ORAL
  Filled 2012-06-14 (×2): qty 1

## 2012-06-14 MED ORDER — WARFARIN SODIUM 3 MG PO TABS
3.0000 mg | ORAL_TABLET | Freq: Once | ORAL | Status: DC
  Filled 2012-06-14: qty 1

## 2012-06-14 MED ORDER — LOPERAMIDE HCL 2 MG PO CAPS
2.0000 mg | ORAL_CAPSULE | ORAL | Status: DC | PRN
  Administered 2012-06-14: 07:00:00 2 mg via ORAL
  Filled 2012-06-14: qty 1

## 2012-06-14 MED ORDER — POTASSIUM CHLORIDE CRYS ER 20 MEQ PO TBCR
40.0000 meq | EXTENDED_RELEASE_TABLET | Freq: Once | ORAL | Status: AC
  Administered 2012-06-14: 40 meq via ORAL
  Filled 2012-06-14: qty 2

## 2012-06-14 MED ORDER — DILTIAZEM HCL ER COATED BEADS 240 MG PO CP24: 240.0000 mg | ORAL_CAPSULE | Freq: Every day | ORAL | Status: DC

## 2012-06-14 MED ORDER — DILTIAZEM HCL ER COATED BEADS 240 MG PO CP24
240.0000 mg | ORAL_CAPSULE | Freq: Every day | ORAL | Status: DC
  Administered 2012-06-14: 10:00:00 240 mg via ORAL
  Filled 2012-06-14: qty 1

## 2012-06-14 MED ORDER — FLECAINIDE ACETATE 50 MG PO TABS: 50.0000 mg | ORAL_TABLET | Freq: Two times a day (BID) | ORAL | Status: DC

## 2012-06-14 NOTE — Plan of Care (Signed)
Problem: Discharge Progression Outcomes Goal: Sinus rate/atrial ECG rhythm with HR < 100/min Outcome: Not Met (add Reason) Pt has history of afib flutter patient given first dose of flecainide prior to discharge.

## 2012-06-14 NOTE — Progress Notes (Signed)
   SUBJECTIVE: The patient is doing well today.  At this time, she denies chest pain, shortness of breath (above baseline), palpitations or any new concerns.     Marland Kitchen buPROPion  150 mg Oral BID  . diltiazem  240 mg Oral Daily  . estrogens (conjugated)  0.625 mg Oral Daily  . fluticasone  2 spray Each Nare Daily  . Fluticasone-Salmeterol  1 puff Inhalation BID  . ibutilide      . magnesium sulfate      . off the beat book   Does not apply Once  . predniSONE  5 mg Oral Q breakfast  . sodium chloride  3 mL Intravenous Q12H  . theophylline  150 mg Oral Q12H  . tiotropium  18 mcg Inhalation Daily  . warfarin  1 mg Oral ONCE-1800  . Warfarin - Pharmacist Dosing Inpatient   Does not apply q1800  . DISCONTD: sodium chloride  3 mL Intravenous Q12H  . DISCONTD: Warfarin - Pharmacist Dosing Inpatient   Does not apply q1800      . DISCONTD: sodium chloride 10 mL/hr (06/12/12 2000)  . DISCONTD: sodium chloride    . DISCONTD: diltiazem (CARDIZEM) infusion Stopped (06/13/12 0705)    OBJECTIVE: Physical Exam: Filed Vitals:   06/13/12 2100 06/14/12 0010 06/14/12 0500 06/14/12 0601  BP:      Pulse:      Temp:  98 F (36.7 C)  98.5 F (36.9 C)  TempSrc:      Resp: 24     Height:      Weight:   144 lb 10 oz (65.6 kg)   SpO2:        Intake/Output Summary (Last 24 hours) at 06/14/12 0814 Last data filed at 06/14/12 0645  Gross per 24 hour  Intake    840 ml  Output   2259 ml  Net  -1419 ml    Telemetry reveals sinus rhythm with intermittent coarse afib, no atrial flutter or SVT  GEN- The patient is chronically appearing, alert and oriented x 3 today.   Head- normocephalic, atraumatic Eyes-  Sclera clear, conjunctiva pink Ears- hearing intact Oropharynx- clear Lungs- decreased BS, normal work of breathing Heart- Regular rate and rhythm  displaced GI- soft, NT, ND, + BS Extremities- no clubbing, cyanosis, or edema, groin is without ecchymosis or hematoma  LABS: Basic Metabolic  Panel:  Basename 06/13/12 0450  NA 140  K 3.6  CL 103  CO2 26  GLUCOSE 113*  BUN 18  CREATININE 0.85  CALCIUM 9.4  MG --  PHOS --   Liver Function Tests: No results found for this basename: AST:2,ALT:2,ALKPHOS:2,BILITOT:2,PROT:2,ALBUMIN:2 in the last 72 hours No results found for this basename: LIPASE:2,AMYLASE:2 in the last 72 hours CBC:  Basename 06/13/12 0450  WBC 8.5  NEUTROABS --  HGB 12.3  HCT 36.4  MCV 88.6  PLT 253   ASSESSMENT AND PLAN:  Active Problems:  OBSTRUCTIVE SLEEP APNEA  SVT (supraventricular tachycardia)  COPD  Atrial flutter   1. Atrial flutter- doing well s/p ablation 2. afib- likely due to underlying lung disease and steroid use Rates are much improved when compared to atrial flutter previously Continue long term anticoagualtion with coumadin Resume cardizem 240mg  daily 3. Chronic lung disease- stable  No further inpatient EP evaluation planned OK to discharge from my standpoint I will see as needed while here Follow-up with me in 4 weeks in the office   Hillis Range, MD 06/14/2012 8:14 AM

## 2012-06-14 NOTE — Op Note (Signed)
NAMEMAURINE, CRADLE                ACCOUNT NO.:  [id number]  MEDICAL RECORD NO.:  [id number]  LOCATION:  6524                         FACILITY:  [facility]  PHYSICIAN:  Hillis Range, MD       DATE OF BIRTH:  Jun 13, 1936  DATE OF PROCEDURE: DATE OF DISCHARGE:                              OPERATIVE REPORT   SURGEON:  Hillis Range, MD  PREPROCEDURE DIAGNOSES: 1. Supraventricular tachycardia. 2. Atrial flutter.  POSTPROCEDURE DIAGNOSES: 1. Isthmus-dependent right atrial flutter. 2. Atrioventricular nodal reentrant tachycardia. 3. Atrial fibrillation.  PROCEDURES: 1. Comprehensive electrophysiology study. 2. Coronary sinus pacing and recording. 3. Mapping of supraventricular tachycardia. 4. Ablation of supraventricular tachycardia.  This includes mapping     and ablation of atrial flutter as well as atrioventricular nodal     reentrant tachycardia. 5. Ibutilide infusion.  INTRODUCTION:  Ms. Poppy is a pleasant 76 year old female with a history of recurrent symptomatic tachycardia.  She initially had a short RP narrow complex tachycardia documented in January 2012.  This was felt to likely represent AV nodal reentrant tachycardia.  She subsequently has had several episodes of recurrent symptomatic atrial flutter for which she has required hospitalization.  She has failed medical therapy with diltiazem.  Due to severe chronic lung disease, she is felt to not to be a good candidate for antiarrhythmic drugs.  She, therefore, presents today for EP study and radiofrequency ablation.  DESCRIPTION OF PROCEDURE:  Informed written consent was obtained and the patient was brought to the Electrophysiology Lab in the fasting state. Significant care and caution were required with sedation today due to the patient's severe COPD.  She received conscious sedation as administrated by our Anesthesia Team.  Full details are disclosed in the anesthesia report.  The patient's right groin was prepped  and draped in the usual sterile fashion by the EP lab staff.  Using a percutaneous Seldinger technique, one 6, one 7, and one 8-French hemostasis sheaths were placed into the right common femoral vein.  A 6-French decapolar Polaris X catheter was introduced through the right common femoral vein and advanced into the coronary sinus for recording and pacing from this location.  A 6-French quadripolar Josephson catheter was introduced through the right common femoral vein and advanced into the right ventricle for recording and pacing.  This catheter was then pulled back to the His bundle location.  The patient presented to the Electrophysiology Lab in sinus rhythm.  She was noted to have very frequent and multifocal premature atrial contractions, at times arising from the left atrium.  Her PR interval measured 123 msec with a QRS duration of 81 msec and a QT interval of 358 msec.  Her RR interval measured 674 msec.  Her AH interval measured 40 msec with an HV interval 49 msec.  Ventricular pacing was performed, which revealed midline concentric decremental VA conduction with a VA Wenckebach cycle length of less than 300 msec.  Due to her advanced age, I did not elect to pace the ventricle any faster than this today.  Ventricular extra stimulus testing was then performed, which revealed midline concentric decremental VA conduction with no retrograde jumps, echo beats, or tachycardias observed.  The  ventricular ERP was 500/210 msec.  Rapid atrial pacing was performed, which revealed PR equal to RR with an AV Wenckebach cycle length of 390 msec.  Tachycardia was not induced. Rapid atrial pacing was continued down to a cycle length of 230 msec with no tachycardias observed.  Atrial extra stimulus testing was performed, which revealed decremental AV conduction with a very clear and reproducible AH jump at 500/300 msec.  Tachycardia, however, was not observed.  There were no echo beats.  The AV  nodal ERP was 500/290 msec. During atrial extra stimulus testing at 500/300 msec, the patient developed atrial fibrillation.  Her atrial fibrillation organized into atrial flutter.  The atrial flutter cycle length was 200 msec with proximal to distal coronary sinus activation suggestive of typical atrial flutter.  Entrainment pace mapping was performed from the left atrium, which revealed a long post-pacing interval when compared to the tachycardia cycle length.  Entrainment was then performed from the cavotricuspid isthmus, which revealed a post-pacing interval equal to the tachycardia cycle length.  The patient was, therefore, felt to have isthmus-dependent right atrial flutter.  I, therefore, elected to perform ablation of the cavotricuspid isthmus. Ablation was delivered today with a [employer] XP 8-mm ablation catheter.    A series of 6 radiofrequency applications were delivered with a target temperature of 60 degrees at 50 watts for 120 seconds each.  The tachycardia slowed and then terminated during ablation.  The patient converted to sinus rhythm, but quickly returned to atrial fibrillation.  She was noted to have a very brief episodes of sinus rhythm (only 1 or 2 sinus beats) followed by recurrence of atrial fibrillation.  This occurred incessantly thereafter both during ablation as well as during waiting phase.  I elected to perform additional ablation along the cavotricuspid isthmus, and the patient continued to have conduction through the cavotricuspid isthmus. An additional 7 radiofrequency applications were delivered along the cavotricuspid isthmus.  Due to persistent atrial fibrillation and very frequent multifocal premature atrial contractions, I was unable to ascertain as to whether or not isthmus block was achieved.  I, therefore, elected to infuse ibutilide 1 mg IV over 10 minutes.  Ibutilide was therefore, infused and the patient's QT interval was followed  closely during infusion without significant prolongation.  The patient did maintain sinus rhythm thereafter.  A duodecapolar Halo catheter was introduced through the right common femoral vein and advanced into the right atrium.  This catheter was positioned around the tricuspid valve anulus.  Differential atrial pacing from the low lateral right atrium was performed which confirmed complete bidirectional cavotricuspid isthmus block.  Attention was then turned to the patient's slow pathway.  Though AV and RT was noninducible today, the patient clearly has a documented history of a short RP tachycardia which I think is most likely AVNRT.  In addition, due to the patient's severe and chronic lung disease, I felt that it was most prudent to go ahead and ablate her slow pathway today rather than take the risk of her having to return for a second procedure in the future which would require additional sedation in the setting of her severe lung disease.  Mapping of Alycia Rossetti triangle was, therefore, performed.  The patient had a standard triangle.  A series of 3 radiofrequency applications were delivered between sites 9 and 6 in [place] triangle. During the third radiofrequency application, accelerated junctional rhythm was observed with intact VA conduction.  The patient did have a single dropped beat, at which point  ablation was discontinued. Following ablation, the AV Wenckebach cycle length measured 480 msec with no evidence of PR greater than RR and no tachycardias observed. Atrial extra stimulus testing was performed, which revealed no jumps, echo beats, or tachycardias.  The AV nodal ERP was 500/370 msec.  The patient's AV node was, therefore, felt to have been successfully ablated today.  The duodecapolar Halo catheter was returned to the right atrium after a 30-minute waiting period.  Differential atrial pacing was again performed, which revealed complete cavotricuspid isthmus block with  a stimulus to earliest atrial activation recorded by bidirectionally across the isthmus measuring 130 msec.  Rapid atrial pacing was continued down to a cycle length of 250 msec with no arrhythmias observed.  Following ablation, the PR interval measured 134 msec with a QRS duration of 109 msec and QT interval of 420 msec.  The RR interval measured 762 msec.  Following ablation, the AH interval measured 59 msec with an HV interval of 55 msec.  The procedure was, therefore, considered completed.  All catheters were removed, and the sheaths were aspirated and flushed.  The sheaths were removed, and hemostasis was assured.  There were no early apparent complications.  This was an extensive procedure today which required additional ablation time due to both AVNRT and atrial flutter.  In addition, incessant atrial fibrillation throughout the case requiring ibutilide also increased our procedure time today.  There was an additional hour required for the procedure today.  CONCLUSIONS: 1. Sinus rhythm upon presentation with frequent multifocal premature     atrial contractions frequently arising from the left atrium. 2. Inducible isthmus-dependent right atrial flutter successfully     ablated along the usual cavotricuspid isthmus with complete     bidirectional isthmus block achieved. 3. Dual AV nodal physiology was present.  The slow pathway was     successfully ablated. 4. Intermittent atrial fibrillation was observed throughout the case     requiring ibutilide infusion without significant QT prolongation. 5. No inducible arrhythmias following ablation. 6. No early apparent complications.     Hillis Range, MD     JA/MEDQ  D:  06/13/2012  T:  06/14/2012  Job:  [id number]  cc:   Joni Fears D. Maple Hudson, MD, FCCP, FACP Corky Crafts, MD

## 2012-06-14 NOTE — Progress Notes (Signed)
ANTICOAGULATION CONSULT NOTE  Pharmacy Consult for Coumadin Indication: Aflutter  Allergies  Allergen Reactions  . Clarithromycin     REACTION: nausea    Patient Measurements: Height: 5\' 5"  (165.1 cm) Weight: 144 lb 10 oz (65.6 kg) IBW/kg (Calculated) : 57   Vital Signs: Temp: 98.1 F (36.7 C) (09/27 0800) Temp src: Oral (09/27 0800) BP: 120/55 mmHg (09/27 0800)  Labs:  Basename 06/14/12 0543 06/13/12 0450 06/12/12 0440 06/11/12 1341  HGB -- 12.3 -- --  HCT -- 36.4 -- --  PLT -- 253 -- --  APTT -- -- -- --  LABPROT 23.9* 30.9* 29.5* --  INR 2.25* 3.19* 2.99* --  HEPARINUNFRC -- -- -- 0.26*  CREATININE -- 0.85 -- --  CKTOTAL -- -- -- --  CKMB -- -- -- --  TROPONINI -- -- -- --    Estimated Creatinine Clearance: 53 ml/min (by C-G formula based on Cr of 0.85).  Assessment: 76 y.o. F resumed on warfarin from PTA for hx Aflutter. INR today is therapeutic after doses were held on 9/25 and low-dose resumed on 9/26 (INR 2.25 << 3.19, goal of 2-3). The patient is s/p abalation on 9/26 by Dr. Johney Frame. No CBC today, no s/sx of bleeding noted.  PTA the patient was known to be taking 2 mg daily EXCEPT for 3 mg on M/W/F. Will plan to resume home dose today.  Goal of Therapy:  INR 2-3   Plan:  1. Warfarin 3 mg x 1 dose at 1800 today 2. Will continue to monitor for any signs/symptoms of bleeding and will follow up with PT/INR in the a.m.   Georgina Pillion, PharmD, BCPS Clinical Pharmacist Pager: [phone] 06/14/2012 11:32 AM

## 2012-06-14 NOTE — Discharge Summary (Signed)
Patient ID: [name] MRN: [id number] DOB/AGE: 1936/07/13 76 y.o.  Admit date: 06/10/2012 Discharge date: 06/14/2012  Primary Discharge Diagnosis atrial flutter Secondary Discharge Diagnosis: atrial fibrillation, chronic respiratory failure, COPD requiring home oxygen  Significant Diagnostic Studies: Electrophysiology study and ablation of right atrial flutter and AVNRT  Consults: Dr. Fayrene Fearing Allred-electrophysiology  [facility] Course: 76 year old woman with severe COPD and known atrial flutter.  She has had several episodes of atrial flutter with rapid ventricular response in the past and was admitted.  She had declined ablation on several occasions.  She fell at home and came to the emergency room.  She could feel palpitations at the time and was found to be in atrial flutter with rapid ventricular response.  She converted spontaneously to normal sinus rhythm but then had several episodes of atrial flutter after that.  She did agree to ablation.  She was diureses with IV Lasix due to diastolic dysfunction and also as an effort to optimize her respiratory status prior to ablation. She underwent the above procedure and tolerated it well.  There was coarse atrial fibrillation noted during the study as well as the day after.  Due to the atrial fibrillation, a low dose of flecainide was started.  Hopefully, this will maintain normal sinus rhythm.  Her diltiazem dose was decreased.  When she was in atrial fibrillation, her heart rate were around 100.  She was asymptomatic from this.  She will undergo a stress test in 1 week after initiating flecainide.  She'll also followup with Dr. Johney Frame in 4 weeks.  Most recent INR was 2.25 she will be contacted by our office to have her Coumadin dose adjusted.   Discharge Exam: Blood pressure 120/55, pulse 90, temperature 98.1 F (36.7 C), temperature source Oral, resp. rate 24, height 5\' 5"  (1.651 m), weight 65.6 kg (144 lb 10 oz), SpO2 95.00%.    Carbon/AT Bilateral wheezing Irregular rhythm, normal rate Bruising noted on chest and left knee from the fall Trace pretibial edema Labs:   Lab Results  Component Value Date   WBC 8.5 06/13/2012   HGB 12.3 06/13/2012   HCT 36.4 06/13/2012   MCV 88.6 06/13/2012   PLT 253 06/13/2012    Lab 06/13/12 0450 06/10/12 2002  NA 140 --  K 3.6 --  CL 103 --  CO2 26 --  BUN 18 --  CREATININE 0.85 --  CALCIUM 9.4 --  PROT -- 6.7  BILITOT -- 0.3  ALKPHOS -- 93  ALT -- 17  AST -- 16  GLUCOSE 113* --   Lab Results  Component Value Date   CKTOTAL 125 03/15/2011   CKMB 8.2* 03/15/2011   TROPONINI <0.30 06/11/2012    No results found for this basename: CHOL   No results found for this basename: HDL   No results found for this basename: LDLCALC   No results found for this basename: TRIG   No results found for this basename: CHOLHDL   No results found for this basename: LDLDIRECT       EKG: Coarse atrial fibrillation with heart rate of 102  FOLLOW UP PLANS AND APPOINTMENTS Discharge Orders    Future Appointments: Provider: Department: Dept Phone: Center:   07/29/2012 2:45 PM Hillis Range, MD [facility] [phone] [facility]   09/02/2012 10:00 AM [facility] [phone] [place]   09/05/2012 2:15 PM Oneita Hurt, MD [facility] [phone] None   09/09/2012 4:00 PM Waymon Budge, MD [facility] [phone] None  Medication List     As of 06/14/2012  4:34 PM    TAKE these medications         buPROPion 150 MG 12 hr tablet   Commonly known as: WELLBUTRIN SR   Take 150 mg by mouth 2 (two) times daily.      diltiazem 240 MG 24 hr capsule   Commonly known as: CARDIZEM CD   Take 1 capsule (240 mg total) by mouth daily.      EPIPEN 0.3 mg/0.3 mL Devi   Generic drug: EPINEPHrine   Inject 0.3 mg into the muscle as needed. For allergic reactions      estrogens conjugated (synthetic A) 0.45 MG tablet   Commonly known as:  CENESTIN   Take 0.45 mg by mouth daily. Take once daily for 21 days, then off for 7 days.  The 25th of each month is the start of 7 days off      flecainide 50 MG tablet   Commonly known as: TAMBOCOR   Take 1 tablet (50 mg total) by mouth every 12 (twelve) hours.      fluticasone 50 MCG/ACT nasal spray   Commonly known as: FLONASE   Place 2 sprays into the nose daily.      Fluticasone-Salmeterol 250-50 MCG/DOSE Aepb   Commonly known as: ADVAIR   Inhale 1 puff into the lungs every 12 (twelve) hours.      guaiFENesin 600 MG 12 hr tablet   Commonly known as: MUCINEX   Take 600 mg by mouth every 12 (twelve) hours as needed. For congestion      ipratropium-albuterol 0.5-2.5 (3) MG/3ML Soln   Commonly known as: DUONEB   Take 3 mLs by nebulization every 6 (six) hours as needed. For shortness of breath      levothyroxine 75 MCG tablet   Commonly known as: SYNTHROID, LEVOTHROID   Take 75 mcg by mouth daily.      montelukast 10 MG tablet   Commonly known as: SINGULAIR   Take 10 mg by mouth at bedtime.      NON FORMULARY   Allergy Vaccine 1:10 GH      NON FORMULARY   CPAP      NON FORMULARY   Oxygen 2-3 LPM      omalizumab 150 MG injection   Commonly known as: XOLAIR   Inject 225 mg into the skin every 14 (fourteen) days.      predniSONE 5 MG tablet   Commonly known as: DELTASONE   Take 5 mg by mouth daily.      PROAIR HFA IN   Inhale 2 puffs into the lungs daily as needed. For shortness of breath      risedronate 35 MG tablet   Commonly known as: ACTONEL   Take 35 mg by mouth every 7 (seven) days. with water on empty stomach, nothing by mouth or lie down for next 30 minutes; takes on Saturdays      theophylline 300 MG 12 hr tablet   Commonly known as: THEODUR   Take 150 mg by mouth 2 (two) times daily.      tiotropium 18 MCG inhalation capsule   Commonly known as: SPIRIVA   Place 18 mcg into inhaler and inhale daily.      traMADol 50 MG tablet   Commonly known  as: ULTRAM   Take 1 tablet by mouth 4 (four) times daily as needed.      traMADol 50 MG tablet   Commonly known  as: ULTRAM   Take 50-100 mg by mouth every 6 (six) hours as needed. For pain      warfarin 1 MG tablet   Commonly known as: COUMADIN   Take 2-3 mg by mouth daily. Takes 2mg  (2 tabs) daily except 3mg  (3 tabs) on Monday, Wednesday, Friday      zolpidem 5 MG tablet   Commonly known as: AMBIEN   Take 5 mg by mouth at bedtime as needed. For sleep           Follow-up Information    Follow up with Hillis Range, MD. On 07/29/2012. (At 2:45 PM)    Contact information:   [address] [place] [address] [phone]       Follow up with Corky Crafts., MD. Schedule an appointment as soon as possible for a visit in 1 week. (for stress test)    Contact information:   [address] [place] [address] [phone]          BRING ALL MEDICATIONS WITH YOU TO FOLLOW UP APPOINTMENTS  Time spent with patient to include physician time:25 minutes Signed: Grigor Lipschutz S. 06/14/2012, 4:34 PM

## 2012-06-14 NOTE — Telephone Encounter (Signed)
Dr. Maple Hudson, pls advise if ok to place order for a replacement cpap.  Thank you.

## 2012-06-14 NOTE — Telephone Encounter (Signed)
Order- DME - replacement CPAP machine and supplies at her current pressure setting. Please also ask DME what that pressure is.  Dx OSA

## 2012-06-14 NOTE — Progress Notes (Signed)
Pt and sisters given discharge instructions via teach back method and all verbalize understanding of instructions.  Pt given first dose of flecainide prior to discharge and had no further complaints. Pt had no pain and vital signs were within normal limits.  Pt had incident of personal home CPAP machine malfunctioning while on another unit with that unit being accused of causing the malfunction.  Risk management notified and resolved the issue by having the patient receive a new CPAP unit per [facility]. Pt escorted out of building via wheelchair to private vehicle.

## 2012-06-17 NOTE — Telephone Encounter (Signed)
Order has been sent to [place] at [facility] for replace CPAP Machine (set at current pressure as old machine) supplies. Rhonda J Cobb

## 2012-06-17 NOTE — Telephone Encounter (Signed)
Order has been placed to [facility] please advise thanks

## 2012-07-02 ENCOUNTER — Ambulatory Visit (INDEPENDENT_AMBULATORY_CARE_PROVIDER_SITE_OTHER): Payer: Medicare Other

## 2012-07-02 DIAGNOSIS — J45909 Unspecified asthma, uncomplicated: Secondary | ICD-10-CM

## 2012-07-03 ENCOUNTER — Other Ambulatory Visit: Payer: Self-pay | Admitting: Internal Medicine

## 2012-07-03 MED ORDER — OMALIZUMAB 150 MG ~~LOC~~ SOLR
225.0000 mg | Freq: Once | SUBCUTANEOUS | Status: AC
  Administered 2012-07-03: 225 mg via SUBCUTANEOUS

## 2012-07-05 ENCOUNTER — Ambulatory Visit (INDEPENDENT_AMBULATORY_CARE_PROVIDER_SITE_OTHER): Payer: Medicare Other

## 2012-07-05 DIAGNOSIS — Z23 Encounter for immunization: Secondary | ICD-10-CM

## 2012-07-26 ENCOUNTER — Ambulatory Visit (INDEPENDENT_AMBULATORY_CARE_PROVIDER_SITE_OTHER): Payer: Medicare Other

## 2012-07-26 DIAGNOSIS — J45909 Unspecified asthma, uncomplicated: Secondary | ICD-10-CM

## 2012-07-26 DIAGNOSIS — J309 Allergic rhinitis, unspecified: Secondary | ICD-10-CM

## 2012-07-26 MED ORDER — OMALIZUMAB 150 MG ~~LOC~~ SOLR
225.0000 mg | Freq: Once | SUBCUTANEOUS | Status: AC
  Administered 2012-07-26: 225 mg via SUBCUTANEOUS

## 2012-07-29 ENCOUNTER — Ambulatory Visit (INDEPENDENT_AMBULATORY_CARE_PROVIDER_SITE_OTHER): Payer: Medicare Other | Admitting: Internal Medicine

## 2012-07-29 ENCOUNTER — Encounter: Payer: Self-pay | Admitting: Internal Medicine

## 2012-07-29 VITALS — BP 140/71 | HR 101 | Ht 65.0 in | Wt 152.8 lb

## 2012-07-29 DIAGNOSIS — I4891 Unspecified atrial fibrillation: Secondary | ICD-10-CM

## 2012-07-29 DIAGNOSIS — I471 Supraventricular tachycardia: Secondary | ICD-10-CM

## 2012-07-29 DIAGNOSIS — I498 Other specified cardiac arrhythmias: Secondary | ICD-10-CM

## 2012-07-29 NOTE — Patient Instructions (Addendum)
STOP Flecainide.  Follow up with Dr. Eldridge Dace as needed.

## 2012-08-09 ENCOUNTER — Telehealth: Payer: Self-pay | Admitting: Internal Medicine

## 2012-08-09 NOTE — Telephone Encounter (Signed)
I have changed pharmacy preference in the patients chart.

## 2012-08-11 ENCOUNTER — Encounter: Payer: Self-pay | Admitting: Internal Medicine

## 2012-08-11 NOTE — Progress Notes (Signed)
PCP: Georgann Housekeeper, MD Primary Cardiologist:  Dr Freddy Jaksch Force is a 76 y.o. female who presents today for routine electrophysiology followup.  Since her recent ablation, the patient reports doing very well.  She denies procedure related complications.  She is pleased with the results of the procedure.  Today, she denies symptoms of palpitations, chest pain, shortness of breath,  lower extremity edema, dizziness, presyncope, or syncope.  The patient is otherwise without complaint today.   Past Medical History  Diagnosis Date  . Unspecified sinusitis (chronic)   . Obstructive sleep apnea   . COPD (chronic obstructive pulmonary disease)     on home O2, chronic steroid use  . Asthma   . Lung nodule   . SVT (supraventricular tachycardia)   . Atrial flutter     typical appearing  . Hypothyroidism   . GERD (gastroesophageal reflux disease)   . Anxiety   . Depression   . History of radiation therapy 11/08/2009-11/18/2009    left upper lung  . Allergy   . Cancer     lung ca s/p XRT 2011  . Lung cancer     non-small cell ca let upper lung stage IA   Past Surgical History  Procedure Date  . Hysterectomy     abdominal  . Tonsillectomy   . Cystectomy     removal from scalp  . Bladder repair     bladder tack  . Appendectomy   . Lung biopsy     Needle bx lung nodule - atypical cells- complicated by PTX/ chest tube  . Abdominal hysterectomy   . Svt ablation 9//26/13    AVNRT and CTI ablation by Dr Johney Frame    Current Outpatient Prescriptions  Medication Sig Dispense Refill  . ADVAIR DISKUS 250-50 MCG/DOSE AEPB inhale 1 dose by mouth twice a day  1 each  2  . Albuterol Sulfate (PROAIR HFA IN) Inhale 2 puffs into the lungs daily as needed. For shortness of breath      . buPROPion (WELLBUTRIN SR) 150 MG 12 hr tablet Take 150 mg by mouth 2 (two) times daily.       Marland Kitchen diltiazem (CARDIZEM CD) 240 MG 24 hr capsule Take 1 capsule (240 mg total) by mouth daily.      Marland Kitchen EPINEPHrine  (EPIPEN) 0.3 mg/0.3 mL DEVI Inject 0.3 mg into the muscle as needed. For allergic reactions      . estrogens conjugated, synthetic A, (CENESTIN) 0.45 MG tablet Take 0.45 mg by mouth daily. Take once daily for 21 days, then off for 7 days.  The 25th of each month is the start of 7 days off      . fluticasone (FLONASE) 50 MCG/ACT nasal spray Place 2 sprays into the nose daily.  1 g  11  . Fluticasone-Salmeterol (ADVAIR) 250-50 MCG/DOSE AEPB Inhale 1 puff into the lungs every 12 (twelve) hours.      Marland Kitchen guaiFENesin (MUCINEX) 600 MG 12 hr tablet Take 600 mg by mouth every 12 (twelve) hours as needed. For congestion      . ipratropium-albuterol (DUONEB) 0.5-2.5 (3) MG/3ML SOLN Take 3 mLs by nebulization every 6 (six) hours as needed. For shortness of breath      . levothyroxine (SYNTHROID, LEVOTHROID) 75 MCG tablet Take 75 mcg by mouth daily.      . montelukast (SINGULAIR) 10 MG tablet Take 10 mg by mouth at bedtime.      . NON FORMULARY CPAP       .  NON FORMULARY Oxygen 2-3 LPM       . omalizumab (XOLAIR) 150 MG injection Inject 225 mg into the skin every 14 (fourteen) days.      . predniSONE (DELTASONE) 5 MG tablet Take 5 mg by mouth daily.      . risedronate (ACTONEL) 35 MG tablet Take 35 mg by mouth every 7 (seven) days. with water on empty stomach, nothing by mouth or lie down for next 30 minutes; takes on Saturdays      . theophylline (THEODUR) 300 MG 12 hr tablet Take 150 mg by mouth 2 (two) times daily.      Marland Kitchen tiotropium (SPIRIVA) 18 MCG inhalation capsule Place 18 mcg into inhaler and inhale daily.      . traMADol (ULTRAM) 50 MG tablet Take 1 tablet by mouth 4 (four) times daily as needed.      . warfarin (COUMADIN) 1 MG tablet Take 2-3 mg by mouth daily. Takes 2mg  (2 tabs) daily except 3mg  (3 tabs) on Monday, Wednesday, Friday      . zolpidem (AMBIEN) 5 MG tablet Take 5 mg by mouth at bedtime as needed. For sleep      . NON FORMULARY Allergy Vaccine 1:10 GH         Physical Exam: Filed  Vitals:   07/29/12 1506  BP: 140/71  Pulse: 101  Height: 5\' 5"  (1.651 m)  Weight: 152 lb 12.8 oz (69.31 kg)    GEN- The patient is well appearing, alert and oriented x 3 today.   Head- normocephalic, atraumatic Eyes-  Sclera clear, conjunctiva pink Ears- hearing intact Oropharynx- clear Lungs- Clear to ausculation bilaterally, normal work of breathing Heart- Regular rate and rhythm, no murmurs, rubs or gallops, PMI not laterally displaced GI- soft, NT, ND, + BS Extremities- no clubbing, cyanosis, or edema  ekg today reveals sinus rhythm 101 bpm, PR 122, otherwise normal ekg  Assessment and Plan:

## 2012-08-11 NOTE — Assessment & Plan Note (Signed)
Doing well s/p ablation for AVNRT and atrial flutter Stop flecainide  No further EP workup planned Follow-up with Dr Eldridge Dace I will see as needed

## 2012-08-13 ENCOUNTER — Ambulatory Visit: Payer: Medicare Other

## 2012-08-14 ENCOUNTER — Ambulatory Visit (INDEPENDENT_AMBULATORY_CARE_PROVIDER_SITE_OTHER): Payer: Medicare Other

## 2012-08-14 DIAGNOSIS — J45909 Unspecified asthma, uncomplicated: Secondary | ICD-10-CM

## 2012-08-16 MED ORDER — OMALIZUMAB 150 MG ~~LOC~~ SOLR
225.0000 mg | Freq: Once | SUBCUTANEOUS | Status: AC
  Administered 2012-08-16: 225 mg via SUBCUTANEOUS

## 2012-08-30 ENCOUNTER — Telehealth: Payer: Self-pay | Admitting: Internal Medicine

## 2012-08-30 MED ORDER — THEOPHYLLINE ER 300 MG PO TB12: 150.0000 mg | ORAL_TABLET | Freq: Two times a day (BID) | ORAL | Status: DC

## 2012-08-30 NOTE — Telephone Encounter (Signed)
rx sent

## 2012-09-02 ENCOUNTER — Ambulatory Visit (HOSPITAL_COMMUNITY)
Admission: RE | Admit: 2012-09-02 | Discharge: 2012-09-02 | Disposition: A | Discharge status: Home / Self Care | Payer: Medicare Other | Source: Ambulatory Visit | Attending: Radiation Oncology | Admitting: Radiation Oncology

## 2012-09-02 ENCOUNTER — Ambulatory Visit (INDEPENDENT_AMBULATORY_CARE_PROVIDER_SITE_OTHER): Payer: Medicare Other

## 2012-09-02 DIAGNOSIS — M949 Disorder of cartilage, unspecified: Secondary | ICD-10-CM | POA: Insufficient documentation

## 2012-09-02 DIAGNOSIS — J45909 Unspecified asthma, uncomplicated: Secondary | ICD-10-CM

## 2012-09-02 DIAGNOSIS — I728 Aneurysm of other specified arteries: Secondary | ICD-10-CM | POA: Insufficient documentation

## 2012-09-02 DIAGNOSIS — J309 Allergic rhinitis, unspecified: Secondary | ICD-10-CM

## 2012-09-02 DIAGNOSIS — N281 Cyst of kidney, acquired: Secondary | ICD-10-CM | POA: Insufficient documentation

## 2012-09-02 DIAGNOSIS — I2510 Atherosclerotic heart disease of native coronary artery without angina pectoris: Secondary | ICD-10-CM | POA: Insufficient documentation

## 2012-09-02 DIAGNOSIS — C3490 Malignant neoplasm of unspecified part of unspecified bronchus or lung: Secondary | ICD-10-CM

## 2012-09-02 DIAGNOSIS — S22009A Unspecified fracture of unspecified thoracic vertebra, initial encounter for closed fracture: Secondary | ICD-10-CM | POA: Insufficient documentation

## 2012-09-02 DIAGNOSIS — I700 Atherosclerosis of aorta: Secondary | ICD-10-CM | POA: Insufficient documentation

## 2012-09-02 DIAGNOSIS — X58XXXA Exposure to other specified factors, initial encounter: Secondary | ICD-10-CM | POA: Insufficient documentation

## 2012-09-02 DIAGNOSIS — C679 Malignant neoplasm of bladder, unspecified: Secondary | ICD-10-CM | POA: Insufficient documentation

## 2012-09-02 DIAGNOSIS — M8448XA Pathological fracture, other site, initial encounter for fracture: Secondary | ICD-10-CM | POA: Insufficient documentation

## 2012-09-02 DIAGNOSIS — M899 Disorder of bone, unspecified: Secondary | ICD-10-CM | POA: Insufficient documentation

## 2012-09-05 ENCOUNTER — Ambulatory Visit
Admission: RE | Admit: 2012-09-05 | Discharge: 2012-09-05 | Disposition: A | Discharge status: Home / Self Care | Payer: Medicare Other | Source: Ambulatory Visit | Attending: Radiation Oncology | Admitting: Radiation Oncology

## 2012-09-05 ENCOUNTER — Telehealth: Payer: Self-pay | Admitting: Internal Medicine

## 2012-09-05 ENCOUNTER — Telehealth: Payer: Self-pay | Admitting: *Deleted

## 2012-09-05 ENCOUNTER — Encounter: Payer: Self-pay | Admitting: Radiation Oncology

## 2012-09-05 VITALS — BP 131/66 | HR 92 | Temp 98.8°F | Resp 22 | Wt 150.8 lb

## 2012-09-05 DIAGNOSIS — C3490 Malignant neoplasm of unspecified part of unspecified bronchus or lung: Secondary | ICD-10-CM

## 2012-09-05 MED ORDER — PREDNISONE 5 MG PO TABS: 5.0000 mg | ORAL_TABLET | Freq: Every day | ORAL | Status: DC

## 2012-09-05 NOTE — Telephone Encounter (Signed)
CALLED PATIENT TO INFORM OF TEST AND FU VISIT, LVM FOR A RETURN CALL 

## 2012-09-05 NOTE — Telephone Encounter (Signed)
Per cy ok to call in  Done

## 2012-09-05 NOTE — Telephone Encounter (Signed)
Pharmacy requesting prednisone 5 mg tablet #90 take 1 tablet by mouth daily or as directed.  Allergies  Allergen Reactions  . Clarithromycin     REACTION: nausea   Dr Maple Hudson please advise.  Thank you

## 2012-09-05 NOTE — Progress Notes (Signed)
Radiation Oncology         [phone] ________________________________  Name: [name] MRN: [id number]  Date: 09/05/2012  DOB: Jul 12, 1936  Follow-Up Visit Note  CC: Georgann Housekeeper, MD  Waymon Budge, MD  Diagnosis:   76 year old woman status post stereotactic body radiotherapy for clinical stage I non-small-cell carcinoma of the lung  Interval Since Last Radiation:  31 months  Narrative:  The patient returns today for routine follow-up.  She continues to several with baseline dyspnea which is essentially unchanged. She had a followup CT scan described below.  It shows some compression fractures correlating with a recent fall/injury.                              ALLERGIES:  is allergic to clarithromycin.  Meds: Current Outpatient Prescriptions  Medication Sig Dispense Refill  . ADVAIR DISKUS 250-50 MCG/DOSE AEPB inhale 1 dose by mouth twice a day  1 each  2  . Albuterol Sulfate (PROAIR HFA IN) Inhale 2 puffs into the lungs daily as needed. For shortness of breath      . buPROPion (WELLBUTRIN SR) 150 MG 12 hr tablet Take 150 mg by mouth 2 (two) times daily.       Marland Kitchen diltiazem (CARDIZEM CD) 240 MG 24 hr capsule Take 1 capsule (240 mg total) by mouth daily.      Marland Kitchen EPINEPHrine (EPIPEN) 0.3 mg/0.3 mL DEVI Inject 0.3 mg into the muscle as needed. For allergic reactions      . estrogens conjugated, synthetic A, (CENESTIN) 0.45 MG tablet Take 0.45 mg by mouth daily. Take once daily for 21 days, then off for 7 days.  The 25th of each month is the start of 7 days off      . flecainide (TAMBOCOR) 50 MG tablet       . fluticasone (FLONASE) 50 MCG/ACT nasal spray Place 2 sprays into the nose daily.  1 g  11  . Fluticasone-Salmeterol (ADVAIR) 250-50 MCG/DOSE AEPB Inhale 1 puff into the lungs every 12 (twelve) hours.      Marland Kitchen guaiFENesin (MUCINEX) 600 MG 12 hr tablet Take 600 mg by mouth every 12 (twelve) hours as needed. For congestion      . ipratropium-albuterol (DUONEB) 0.5-2.5 (3)  MG/3ML SOLN Take 3 mLs by nebulization every 6 (six) hours as needed. For shortness of breath      . levothyroxine (SYNTHROID, LEVOTHROID) 75 MCG tablet Take 75 mcg by mouth daily.      . montelukast (SINGULAIR) 10 MG tablet Take 10 mg by mouth at bedtime.      . NON FORMULARY Allergy Vaccine 1:10 GH       . NON FORMULARY CPAP       . NON FORMULARY Oxygen 2-3 LPM       . omalizumab (XOLAIR) 150 MG injection Inject 225 mg into the skin every 14 (fourteen) days.      . predniSONE (DELTASONE) 5 MG tablet Take 5 mg by mouth daily.      Marland Kitchen PREMARIN 0.45 MG tablet       . risedronate (ACTONEL) 35 MG tablet Take 35 mg by mouth every 7 (seven) days. with water on empty stomach, nothing by mouth or lie down for next 30 minutes; takes on Saturdays      . theophylline (THEODUR) 300 MG 12 hr tablet Take 0.5 tablets (150 mg total) by mouth 2 (two) times daily.  30 tablet  3  . tiotropium (SPIRIVA) 18 MCG inhalation capsule Place 18 mcg into inhaler and inhale daily.      . traMADol (ULTRAM) 50 MG tablet Take 1 tablet by mouth 4 (four) times daily as needed.      . warfarin (COUMADIN) 1 MG tablet Take 2-3 mg by mouth daily. Takes 2mg  (2 tabs) daily except 3mg  (3 tabs) on Monday, Wednesday, Friday      . zolpidem (AMBIEN) 5 MG tablet Take 5 mg by mouth at bedtime as needed. For sleep        Physical Findings: The patient is in no acute distress. Patient is alert and oriented.  weight is 150 lb 12.8 oz (68.402 kg). Her oral temperature is 98.8 F (37.1 C). Her blood pressure is 131/66 and her pulse is 92. Her respiration is 22 and oxygen saturation is 92%. .  No significant changes.  Lab Findings: Lab Results  Component Value Date   WBC 8.5 06/13/2012   HGB 12.3 06/13/2012   HCT 36.4 06/13/2012   MCV 88.6 06/13/2012   PLT 253 06/13/2012   Radiographic Findings: Ct Chest Wo Contrast  09/02/2012  *RADIOLOGY REPORT*  Clinical Data: Follow up lung cancer.  CT CHEST WITHOUT CONTRAST  Technique:  Multidetector  CT imaging of the chest was performed following the standard protocol without IV contrast.  Comparison: 05/30/2012  Findings: Lungs/pleura: No pleural effusion identified.  Moderate changes of paraseptal and centrilobular emphysema noted. Postsurgical change in volume loss noted in the right apex.  The irregular, triangular shaped nodular density in the left apex measures 3 x 0.9 cm, image 11/series 5.  Previously 3.8 x 1.2 cm. Nodule in the right base is stable measuring 4 mm nodule in the right base measures 6 mm, image 49/series 5.  Stable from previous exam.  Heart/Mediastinum: Calcified atherosclerotic disease affects the thoracic aorta and left circumflex coronary artery.  Normal heart size.  No pericardial effusion.  No mediastinal or hilar adenopathy.  Upper abdomen: Small peripherally calcified splenic artery aneurysm measures 8 mm, image 58/series 2.  Cyst arising from the upper pole of the left kidney measures 1.9 cm, image 65 series 2. Incompletely characterized without IV contrast.  Bones/Musculoskeletal:  Review of the visualized osseous structures demonstrates interval sclerosis of the sternal manubrium. Pathologic fracture through this area is identified, image 13 series 5.  This is a new finding from previous exam. Abnormal sclerosis and compression deformities involving T2 and T3 appears stable from previous exam.  Similar appearance of T12 compression fracture.  IMPRESSION:  1.  Stable appearance of the left apical pleural based density which is favored to represent a nodular appearing post radiation fibrosis. 2.  Interval sclerosis and pathologic fracture of the manubrium. This may represent blastic bone metastasis.  Alternatively these findings may represent osteonecrosis secondary to post radiation therapy. 3.  Stable appearance of the compression fractures and abnormal increase sclerosis involving the T2 and T3 vertebra.  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   Cannot rule out sclerotic bone metastasis with associated pathologic fractures. 4.  Stable T12 compression fracture   Original Report Authenticated By: Signa Kell, M.D.    Impression:  The patient has no evidence of active cancer.  She does have some osteoporotic appearing fractures of the T2 and T3 vertebral bodies and manubrium, which are in close proximity to her stereotactic body radiotherapy.  It is not clear if these are related, but the proximity certainly raises concern for that.  Regardless,  the patient may benefit from continued Actonel treatment to increase bone density.  This is a picture showing the radiation distribution in 3 planes.   This is a picture showing the cross section of the current CT   Plan:  CT chest in 6 months then f/u.  _____________________________________  Artist Pais. Kathrynn Running, M.D.

## 2012-09-05 NOTE — Progress Notes (Signed)
Pt reports dry cough x 1 month, taking Mucinex prn, not daily. Pt on O2 continuously, 3.5L/min. Pt denies pain, loss of appetite, does report less energy recently.

## 2012-09-06 DIAGNOSIS — J45909 Unspecified asthma, uncomplicated: Secondary | ICD-10-CM

## 2012-09-06 MED ORDER — OMALIZUMAB 150 MG ~~LOC~~ SOLR
225.0000 mg | Freq: Once | SUBCUTANEOUS | Status: AC
  Administered 2012-09-06: 225 mg via SUBCUTANEOUS

## 2012-09-09 ENCOUNTER — Ambulatory Visit (INDEPENDENT_AMBULATORY_CARE_PROVIDER_SITE_OTHER): Payer: Medicare Other | Admitting: Internal Medicine

## 2012-09-09 ENCOUNTER — Encounter: Payer: Self-pay | Admitting: Internal Medicine

## 2012-09-09 VITALS — BP 120/76 | HR 96 | Ht 65.0 in | Wt 153.2 lb

## 2012-09-09 DIAGNOSIS — G4733 Obstructive sleep apnea (adult) (pediatric): Secondary | ICD-10-CM

## 2012-09-09 DIAGNOSIS — J449 Chronic obstructive pulmonary disease, unspecified: Secondary | ICD-10-CM

## 2012-09-09 DIAGNOSIS — J984 Other disorders of lung: Secondary | ICD-10-CM

## 2012-09-09 MED ORDER — METHYLPREDNISOLONE ACETATE 80 MG/ML IJ SUSP
80.0000 mg | Freq: Once | INTRAMUSCULAR | Status: AC
  Administered 2012-09-09: 80 mg via INTRAMUSCULAR

## 2012-09-09 NOTE — Patient Instructions (Addendum)
Ok to continue theophylline  We can continue Xolair  Depo 71  Ok to continue routine meds  Please call as needed

## 2012-09-09 NOTE — Progress Notes (Signed)
Patient ID: [name], female    DOB: Dec 28, 1935, 76 y.o.   MRN: [id number]  HPI 75/12- 76 yo former smoker with severe COPD/ chronic hypoxic respiratory failure, hx lung nodule treated with XRT as presumptive Ca w/o bx. OSA  Husband here. Last here November 28, 2010. Since then was hosp for tachyarrythmia- considered for ablation. She was concerned about risk of being put to sleep and wanted to discuss it here.  Now feels sore at strap level around chest, very congested and wheezy. Nebulizer helps but needing frequently. Malaise. Sputum white, no fever. Feet feel still a little swollen. Continues prednisone 5 mg every other day.   06/02/11- 35 yo former smoker with severe COPD/ chronic hypoxic respiratory failure, hx lung nodule treated with XRT as presumptive Ca w/o bx. OSA  Doing better since last visit. Less drainage and less cough. Breathing comfortable at rest on room air but not with any exertion. Theophylline level low 03/15/11- not likely to contribute to irregular heart beat.  Continues O2, low dose prednisone and Xolair.   10/03/11-  31 yo former smoker with severe COPD/ chronic hypoxic respiratory failure, hx lung nodule treated with XRT as presumptive Ca w/o bx, OSA Husband here. More increased SOB than usual and pain in back and chest area over past 2-3 days(recently had flu) Heart rate went up to 150s an hour or two after pain started. Had to double up on ditiazem due to increased heart palpitation-CP stopped once med taken. She reported this to her cardiology office, who referred her here. Today she feels back to normal, just tired.   Sleep Apnea -Wears CPAP for approx 6-8 hours at night.  11/10/11- 47 yo former smoker with severe COPD/ chronic hypoxic respiratory failure, hx lung nodule treated with XRT as presumptive Ca w/o bx, OSA Husband here. Acute visit- C/O: pain in upper back and around sides(pain moves); consistent pain but more intense with inhaling.    02/01/12-  3 yo  former smoker with severe COPD/ chronic hypoxic respiratory failure, hx lung nodule treated with XRT as presumptive Ca w/o bx, OSA    Husband here  Has to stop and rest more than usual; has turned O2 up to 3.5 and feels better In the past 2 or 3 weeks has had increased dyspnea on exertion with no abrupt change. Continues maintenance prednisone 5 mg daily. Uses nebulizer less than once per day. Some tachycardia palpitation and has a history of SVT. There has been some discussion of an ablation procedure. She continues allergy vaccine and feels that has helped her through the spring. CT 12/14/11 reviewed images with them. IMPRESSION:  Increasing paramediastinal nodularity along the medial left upper  lobe. While this may reflect fibrosis related to prior radiation,  given the history of XRT completion in 2011, the interval change is  worrisome. PET-CT is suggested for further evaluation.  Underlying moderate centrilobular and paraseptal emphysema with  stable bullous changes in the right lung apex.  These results will be called to the ordering clinician or  representative by the Radiologist Assistant, and communication  documented in the PACS Dashboard.  Original Report Authenticated By: Julian Hy, M.D.    05/09/12- 89 yo former smoker with severe COPD/ chronic hypoxic respiratory failure, hx lung nodule treated with XRT as presumptive Ca w/o bx, OSA ., Hx SVT    Recent acute illness: Slight fever, SOB, and cough. Has sinus flare up , with nausea, myalgias, nasal congestion and watery rhinorrhea. Sister had  similar illness. Still on prednisone 5 mg daily and oxygen 3.5 L/Advanced.  History of SVT and notices occasional tachycardia palpitation. Nodular prominence left upper lobe suspicious for tumor recurrence. She has followup chest CT and appointment pending with Dr. Kathrynn Running Rad.Onc. OSA/ CPAP doing well with no changes or concerns. PET 02/13/12- images reviewed with her IMPRESSION:   Continued progression of abnormal soft tissue/nodularity in the  medial left upper lung, now measuring approximately 2.6 x 1.5 cm,  mildly FDG avid with max SUV 4.4. This appearance is suspicious  for tumor recurrence.  No evidence of metastatic disease.  Original Report Authenticated By: Charline Bills, M.D.    09/09/12- 80 yo former smoker with severe COPD/ chronic hypoxic respiratory failure, hx lung nodule treated with XRT as presumptive Ca w/o bx, OSA ., Hx SVT FOLLOWS FOR: chest congestion(using Mucinex), wheezing, and more SOB than usual Ran out of [place] for 5 days and just restarted a week ago. Began again increased chest congestion. Some dry throat clearing and wheeze but no fever or sore throat. Had fallen and bruised her chest. Continues prednisone 5 mg daily Had ablation for heart rhythm-successful. Xolair continues to help. CT chest 09/02/12 reviewed with her IMPRESSION:  1. 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  2. Interval sclerosis and pathologic fracture of the manubrium.  This may represent blastic bone metastasis. Alternatively these  findings may represent osteonecrosis secondary to post radiation  therapy.  3. Stable appearance of the compression fractures and abnormal  increase sclerosis involving the T2 and T3 vertebra. 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 Cannot rule  out sclerotic bone metastasis with associated pathologic fractures.  4. Stable T12 compression fracture  Original Report Authenticated By: Signa Kell, M.D.    Review of Systems- see HPI Constitutional:   No weight loss, night sweats, fevers, chills, fatigue, lassitude. HEENT:   No headaches,  Difficulty swallowing,  Tooth/dental problems,  Sore throat,                No sneezing, itching, ear ache, +nasal congestion, +post nasal drip,  CV:  No acute chest  pain,  No-orthopnea, PND, swelling in lower extremities, anasarca, dizziness, palpitations GI  No heartburn, + indigestion,  No-abdominal pain, nausea, vomiting,  Resp:No excess mucus, No coughing up of blood.  No change in color of mucus.  Skin: no rash or lesions. GU:  MS:  No joint pain or swelling. Marland Kitchen Psych:  No change in mood or affect. No depression or anxiety.  No memory loss.    Objective:   Physical Exam BP 120/76  Pulse 96  Ht 5\' 5"  (1.651 m)  Wt 153 lb 3.2 oz (69.491 kg)  BMI 25.49 kg/m2  SpO2 96% General- Alert, Oriented, Affect-appropriate, Distress- none acute   O2 on 3.5 L/M Skin- rash-none, lesions- none, excoriation- none Lymphadenopathy- none Head- atraumatic            Eyes- Gross vision intact, PERRLA, conjunctivae clear secretions            Ears- Hearing, canals            Nose- + mucoid bridging, Septal dev,  polyps, erosion, perforation. Chronic nasal quality to speech.              Throat- Mallampati II , mucosa clear , drainage- none, tonsils- atrophic Neck- flexible , trachea midline, no stridor , thyroid  nl, carotid no bruit Chest - symmetrical excursion , unlabored           Heart/CV- RRR , no murmur , no gallop  , no rub, nl s1 s2                           - JVD- none , edema- none, stasis changes- none, varices- none           Lung- + raspy cough, decreased sounds  , dullness-none, rub- none, unlabored           Chest wall-  Abd-  Br/ Gen/ Rectal- Not done, not indicated Extrem- cyanosis- none, clubbing, none, atrophy- none, strength- nl Neuro- grossly intact to observation

## 2012-09-17 ENCOUNTER — Encounter: Payer: Self-pay | Admitting: Internal Medicine

## 2012-09-20 NOTE — Assessment & Plan Note (Signed)
She remains oxygen dependent and on prednisone 5 mg daily. Nonspecific increased dyspnea today. Plan Depo-Medrol

## 2012-09-20 NOTE — Assessment & Plan Note (Signed)
She continues good compliance and control, supplemented CPAP 8 with oxygen/Advanced

## 2012-09-20 NOTE — Assessment & Plan Note (Signed)
CT scan was reviewed. She will be following up with her radiation oncologist

## 2012-09-26 ENCOUNTER — Telehealth: Payer: Self-pay | Admitting: Internal Medicine

## 2012-09-26 MED ORDER — AZITHROMYCIN 250 MG PO TABS: ORAL_TABLET | ORAL | Status: DC

## 2012-09-26 MED ORDER — HYDROCODONE-HOMATROPINE 5-1.5 MG/5ML PO SYRP: 5.0000 mL | ORAL_SOLUTION | Freq: Four times a day (QID) | ORAL | Status: DC | PRN

## 2012-09-26 NOTE — Telephone Encounter (Signed)
I spoke with pt. C/o nasal congestion, PND, cough w/ clear phlem but blowing out yellow phlem, slight chest congestion. No f/c/s/n/v. Taking mucinex daily. Requesting to have something called in for her plus hycodan cough syrup. Please advise Dr. Maple Hudson thanks Last ov 09/09/12 PENDING 03/10/13 [facility] Allergies  Allergen Reactions  . Clarithromycin     REACTION: nausea

## 2012-09-26 NOTE — Telephone Encounter (Signed)
Pt notified that rx were sent to pharmacy per Dr Maple Hudson.

## 2012-09-26 NOTE — Telephone Encounter (Signed)
Per CY-okay to give Zpak #1 take as directed no refills; Hycodan #200 ml 1 tsp every 6 hours prn cough no refills.

## 2012-10-04 ENCOUNTER — Ambulatory Visit (INDEPENDENT_AMBULATORY_CARE_PROVIDER_SITE_OTHER): Payer: Medicare Other

## 2012-10-04 DIAGNOSIS — J309 Allergic rhinitis, unspecified: Secondary | ICD-10-CM

## 2012-10-04 DIAGNOSIS — J45909 Unspecified asthma, uncomplicated: Secondary | ICD-10-CM

## 2012-10-08 ENCOUNTER — Other Ambulatory Visit: Payer: Self-pay | Admitting: Internal Medicine

## 2012-10-08 MED ORDER — MONTELUKAST SODIUM 10 MG PO TABS: 10.0000 mg | ORAL_TABLET | Freq: Every day | ORAL | Status: DC

## 2012-10-08 MED ORDER — FLUTICASONE-SALMETEROL 250-50 MCG/DOSE IN AEPB: 1.0000 | INHALATION_SPRAY | Freq: Two times a day (BID) | RESPIRATORY_TRACT | Status: DC

## 2012-10-08 MED ORDER — OMALIZUMAB 150 MG ~~LOC~~ SOLR
225.0000 mg | Freq: Once | SUBCUTANEOUS | Status: AC
  Administered 2012-10-08: 225 mg via SUBCUTANEOUS

## 2012-10-09 ENCOUNTER — Ambulatory Visit (INDEPENDENT_AMBULATORY_CARE_PROVIDER_SITE_OTHER): Payer: Medicare Other

## 2012-10-09 DIAGNOSIS — J309 Allergic rhinitis, unspecified: Secondary | ICD-10-CM

## 2012-10-17 ENCOUNTER — Ambulatory Visit: Payer: Medicare Other

## 2012-10-18 ENCOUNTER — Ambulatory Visit: Payer: Medicare Other

## 2012-10-21 ENCOUNTER — Ambulatory Visit (INDEPENDENT_AMBULATORY_CARE_PROVIDER_SITE_OTHER): Payer: Medicare Other

## 2012-10-21 DIAGNOSIS — J309 Allergic rhinitis, unspecified: Secondary | ICD-10-CM

## 2012-10-21 DIAGNOSIS — J45909 Unspecified asthma, uncomplicated: Secondary | ICD-10-CM

## 2012-10-22 MED ORDER — OMALIZUMAB 150 MG ~~LOC~~ SOLR
225.0000 mg | Freq: Once | SUBCUTANEOUS | Status: AC
  Administered 2012-10-22: 225 mg via SUBCUTANEOUS

## 2012-10-25 ENCOUNTER — Emergency Department (HOSPITAL_COMMUNITY): Payer: Medicare Other

## 2012-10-25 ENCOUNTER — Encounter (HOSPITAL_COMMUNITY): Payer: Self-pay

## 2012-10-25 ENCOUNTER — Emergency Department (HOSPITAL_COMMUNITY)
Admission: EM | Admit: 2012-10-25 | Discharge: 2012-10-25 | Disposition: A | Discharge status: Home / Self Care | Payer: Medicare Other | Attending: Emergency Medicine | Admitting: Emergency Medicine

## 2012-10-25 DIAGNOSIS — Y9389 Activity, other specified: Secondary | ICD-10-CM | POA: Insufficient documentation

## 2012-10-25 DIAGNOSIS — F3289 Other specified depressive episodes: Secondary | ICD-10-CM | POA: Insufficient documentation

## 2012-10-25 DIAGNOSIS — Z923 Personal history of irradiation: Secondary | ICD-10-CM | POA: Insufficient documentation

## 2012-10-25 DIAGNOSIS — Z8709 Personal history of other diseases of the respiratory system: Secondary | ICD-10-CM | POA: Insufficient documentation

## 2012-10-25 DIAGNOSIS — Z87891 Personal history of nicotine dependence: Secondary | ICD-10-CM | POA: Insufficient documentation

## 2012-10-25 DIAGNOSIS — F411 Generalized anxiety disorder: Secondary | ICD-10-CM | POA: Insufficient documentation

## 2012-10-25 DIAGNOSIS — Z7901 Long term (current) use of anticoagulants: Secondary | ICD-10-CM | POA: Insufficient documentation

## 2012-10-25 DIAGNOSIS — J441 Chronic obstructive pulmonary disease with (acute) exacerbation: Secondary | ICD-10-CM | POA: Insufficient documentation

## 2012-10-25 DIAGNOSIS — E039 Hypothyroidism, unspecified: Secondary | ICD-10-CM | POA: Insufficient documentation

## 2012-10-25 DIAGNOSIS — Y929 Unspecified place or not applicable: Secondary | ICD-10-CM | POA: Insufficient documentation

## 2012-10-25 DIAGNOSIS — D689 Coagulation defect, unspecified: Secondary | ICD-10-CM | POA: Insufficient documentation

## 2012-10-25 DIAGNOSIS — F329 Major depressive disorder, single episode, unspecified: Secondary | ICD-10-CM | POA: Insufficient documentation

## 2012-10-25 DIAGNOSIS — Z8679 Personal history of other diseases of the circulatory system: Secondary | ICD-10-CM | POA: Insufficient documentation

## 2012-10-25 DIAGNOSIS — K219 Gastro-esophageal reflux disease without esophagitis: Secondary | ICD-10-CM | POA: Insufficient documentation

## 2012-10-25 DIAGNOSIS — IMO0002 Reserved for concepts with insufficient information to code with codable children: Secondary | ICD-10-CM | POA: Insufficient documentation

## 2012-10-25 DIAGNOSIS — W010XXA Fall on same level from slipping, tripping and stumbling without subsequent striking against object, initial encounter: Secondary | ICD-10-CM | POA: Insufficient documentation

## 2012-10-25 DIAGNOSIS — Z79899 Other long term (current) drug therapy: Secondary | ICD-10-CM | POA: Insufficient documentation

## 2012-10-25 DIAGNOSIS — Z85118 Personal history of other malignant neoplasm of bronchus and lung: Secondary | ICD-10-CM | POA: Insufficient documentation

## 2012-10-25 DIAGNOSIS — S300XXA Contusion of lower back and pelvis, initial encounter: Secondary | ICD-10-CM | POA: Insufficient documentation

## 2012-10-25 LAB — CBC WITH DIFFERENTIAL/PLATELET
Basophils Absolute: 0 10*3/uL (ref 0.0–0.1)
Basophils Relative: 0 % (ref 0–1)
Eosinophils Absolute: 0 10*3/uL (ref 0.0–0.7)
MCH: 30.8 pg (ref 26.0–34.0)
MCHC: 34 g/dL (ref 30.0–36.0)
Monocytes Relative: 3 % (ref 3–12)
Neutrophils Relative %: 92 % — ABNORMAL HIGH (ref 43–77)
Platelets: 296 10*3/uL (ref 150–400)
RDW: 13.7 % (ref 11.5–15.5)

## 2012-10-25 LAB — BASIC METABOLIC PANEL
BUN: 18 mg/dL (ref 6–23)
GFR calc Af Amer: 68 mL/min — ABNORMAL LOW (ref >90)
GFR calc non Af Amer: 58 mL/min — ABNORMAL LOW (ref >90)
Potassium: 3.6 mEq/L (ref 3.5–5.1)
Sodium: 135 mEq/L (ref 135–145)

## 2012-10-25 LAB — PROTIME-INR
INR: 3.54 — ABNORMAL HIGH (ref 0.00–1.49)
Prothrombin Time: 33.4 seconds — ABNORMAL HIGH (ref 11.6–15.2)

## 2012-10-25 MED ORDER — OXYCODONE-ACETAMINOPHEN 5-325 MG PO TABS: 1.0000 | ORAL_TABLET | ORAL | Status: DC | PRN

## 2012-10-25 MED ORDER — AZITHROMYCIN 250 MG PO TABS: 250.0000 mg | ORAL_TABLET | Freq: Every day | ORAL | Status: DC

## 2012-10-25 MED ORDER — OXYCODONE-ACETAMINOPHEN 5-325 MG PO TABS
2.0000 | ORAL_TABLET | Freq: Once | ORAL | Status: AC
  Administered 2012-10-25: 2 via ORAL
  Filled 2012-10-25: qty 2

## 2012-10-25 MED ORDER — PREDNISONE 20 MG PO TABS: 40.0000 mg | ORAL_TABLET | Freq: Every day | ORAL | Status: DC

## 2012-10-25 MED ORDER — ALBUTEROL (5 MG/ML) CONTINUOUS INHALATION SOLN
10.0000 mg/h | INHALATION_SOLUTION | RESPIRATORY_TRACT | Status: AC
  Administered 2012-10-25: 10 mg/h via RESPIRATORY_TRACT
  Filled 2012-10-25: qty 20

## 2012-10-25 NOTE — ED Provider Notes (Signed)
History     CSN: [id number]  Arrival date & time 10/25/12  1339   First MD Initiated Contact with Patient 10/25/12 1510      Chief Complaint  Patient presents with  . Fall    (Consider location/radiation/quality/duration/timing/severity/associated sxs/prior treatment) HPI Comments: 77 year old female with history of atrial fibrillation requiring anticoagulants therapy with Coumadin who presents after having 2 falls this week. The first was on Sunday, the second fall was yesterday. The patient states that she loses her balance, she does not syncopized, she does not have vertigo, she does not have focal weakness. Yesterday when she fell she landed on her right buttocks. She has developed significant bruising around the right buttocks but has been able to ambulate with some assistance at home. She also has COPD and has an increased oxygen requirement over the last week. She denies coughing or fevers but has increased shortness of breath. The symptoms are gradually getting worse, nothing seems to make it better, worse with ambulation. No associated swelling of the lower strumming is below the knees.  The history is provided by the patient and the spouse.    Past Medical History  Diagnosis Date  . Unspecified sinusitis (chronic)   . Obstructive sleep apnea   . COPD (chronic obstructive pulmonary disease)     on home O2, chronic steroid use  . Asthma   . Lung nodule   . SVT (supraventricular tachycardia)   . Atrial flutter     typical appearing  . Hypothyroidism   . GERD (gastroesophageal reflux disease)   . Anxiety   . Depression   . History of radiation therapy 11/08/2009-11/18/2009    left upper lung  . Allergy   . Cancer     lung ca s/p XRT 2011  . Lung cancer     non-small cell ca let upper lung stage IA    Past Surgical History  Procedure Date  . Hysterectomy     abdominal  . Tonsillectomy   . Cystectomy     removal from scalp  . Bladder repair     bladder tack  .  Appendectomy   . Lung biopsy     Needle bx lung nodule - atypical cells- complicated by PTX/ chest tube  . Abdominal hysterectomy   . Svt ablation 9//26/13    AVNRT and CTI ablation by Dr Johney Frame    Family History  Problem Relation Age of Onset  . Heart disease Mother 104    History  Substance Use Topics  . Smoking status: Former Smoker -- 2.0 packs/day for 40 years    Types: Cigarettes    Quit date: 09/18/1986  . Smokeless tobacco: Never Used  . Alcohol Use: No    OB History    Grav Para Term Preterm Abortions TAB SAB Ect Mult Living                  Review of Systems  All other systems reviewed and are negative.    Allergies  Clarithromycin  Home Medications   Current Outpatient Rx  Name  Route  Sig  Dispense  Refill  . ALBUTEROL SULFATE HFA 108 (90 BASE) MCG/ACT IN AERS   Inhalation   Inhale 2 puffs into the lungs every 6 (six) hours as needed. For shortness of breath         . BUPROPION HCL ER (SR) 150 MG PO TB12   Oral   Take 150 mg by mouth 2 (two) times daily.          Marland Kitchen  DILTIAZEM HCL ER COATED BEADS 240 MG PO CP24   Oral   Take 1 capsule (240 mg total) by mouth daily.         Marland Kitchen EPINEPHRINE 0.3 MG/0.3ML IJ DEVI   Intramuscular   Inject 0.3 mg into the muscle as needed. For allergic reactions         . ESTROGENS CONJ SYNTHETIC A 0.45 MG PO TABS   Oral   Take 0.45 mg by mouth daily. Take once daily for 21 days, then off for 7 days.  The 25th of each month is the start of 7 days off         . FLECAINIDE ACETATE 50 MG PO TABS   Oral   Take 50 mg by mouth 2 (two) times daily.          Marland Kitchen FLUTICASONE PROPIONATE 50 MCG/ACT NA SUSP   Nasal   Place 2 sprays into the nose daily.   1 g   11   . FLUTICASONE-SALMETEROL 250-50 MCG/DOSE IN AEPB   Inhalation   Inhale 1 puff into the lungs every 12 (twelve) hours.   60 each   6   . GUAIFENESIN ER 600 MG PO TB12   Oral   Take 600 mg by mouth every 12 (twelve) hours as needed. For  congestion         . HYDROCODONE-HOMATROPINE 5-1.5 MG/5ML PO SYRP   Oral   Take 5 mLs by mouth every 6 (six) hours as needed for cough.   200 mL   0   . IPRATROPIUM-ALBUTEROL 0.5-2.5 (3) MG/3ML IN SOLN   Nebulization   Take 3 mLs by nebulization every 6 (six) hours as needed. For shortness of breath         . LEVOTHYROXINE SODIUM 75 MCG PO TABS   Oral   Take 75 mcg by mouth daily.         Marland Kitchen MONTELUKAST SODIUM 10 MG PO TABS   Oral   Take 1 tablet (10 mg total) by mouth at bedtime.   30 tablet   5   . OMALIZUMAB 150 MG [place] SOLR   Subcutaneous   Inject 225 mg into the skin every 14 (fourteen) days. Friday         . PREDNISONE 5 MG PO TABS   Oral   Take 1 tablet (5 mg total) by mouth daily.   90 tablet   1   . RISEDRONATE SODIUM 35 MG PO TABS   Oral   Take 35 mg by mouth every 7 (seven) days. with water on empty stomach, nothing by mouth or lie down for next 30 minutes; takes on Saturdays         . THEOPHYLLINE ER 300 MG PO TB12   Oral   Take 0.5 tablets (150 mg total) by mouth 2 (two) times daily.   30 tablet   3   . TIOTROPIUM BROMIDE MONOHYDRATE 18 MCG IN CAPS   Inhalation   Place 18 mcg into inhaler and inhale daily.         . TRAMADOL HCL 50 MG PO TABS   Oral   Take 1 tablet by mouth 4 (four) times daily as needed.         . WARFARIN SODIUM 1 MG PO TABS   Oral   Take 2-3 mg by mouth daily. Takes 2mg  (2 tabs) daily except 3mg  (3 tabs) on Monday, Wednesday, Friday         . ZOLPIDEM  TARTRATE 5 MG PO TABS   Oral   Take 2.5 mg by mouth at bedtime as needed. For sleep         . AZITHROMYCIN 250 MG PO TABS   Oral   Take 1 tablet (250 mg total) by mouth daily. 500mg  PO day 1, then 250mg  PO days 205   6 tablet   0   . AZITHROMYCIN 250 MG PO TABS      Take two tablets the first day then one tablet daily until gone.   6 tablet   0   . NON FORMULARY      Allergy Vaccine 1:10 GH          . NON FORMULARY      CPAP          . NON  FORMULARY      Oxygen 2-3 LPM          . OXYCODONE-ACETAMINOPHEN 5-325 MG PO TABS   Oral   Take 1 tablet by mouth every 4 (four) hours as needed for pain.   20 tablet   0   . PREDNISONE 20 MG PO TABS   Oral   Take 2 tablets (40 mg total) by mouth daily.   10 tablet   0     BP 128/65  Pulse 108  Temp 98.2 F (36.8 C) (Oral)  Resp 26  SpO2 97%  Physical Exam  Nursing note and vitals reviewed. Constitutional: She appears well-developed and well-nourished. No distress.  HENT:  Head: Normocephalic and atraumatic.  Mouth/Throat: Oropharynx is clear and moist. No oropharyngeal exudate.  Eyes: Conjunctivae normal and EOM are normal. Pupils are equal, round, and reactive to light. Right eye exhibits no discharge. Left eye exhibits no discharge. No scleral icterus.  Neck: Normal range of motion. Neck supple. No JVD present. No thyromegaly present.  Cardiovascular: Normal rate, regular rhythm, normal heart sounds and intact distal pulses.  Exam reveals no gallop and no friction rub.   No murmur heard. Pulmonary/Chest: She is in respiratory distress. She has wheezes. She has no rales.       Increased work of breathing with tachypnea to 26 breath per minute, hypoxia to 90% on 3 L. Diffuse expiratory wheezing, speaks in short sentences  Abdominal: Soft. Bowel sounds are normal. She exhibits no distension and no mass. There is no tenderness.  Musculoskeletal: Normal range of motion. She exhibits tenderness ( Tenderness over the right buttock with her is a very large hematoma. There is not appear to be any tenderness over the bony elements of the spine or the pelvis or the hip. Normal range of motion of the right hip without difficulty). She exhibits no edema.  Lymphadenopathy:    She has no cervical adenopathy.  Neurological: She is alert. Coordination normal.       Speech is clear, movements are coordinated, moves all extremities x4  Skin: Skin is warm and dry.       Bruising to the  right buttock  Psychiatric: She has a normal mood and affect. Her behavior is normal.    ED Course  Procedures (including critical care time)  Labs Reviewed  PROTIME-INR - Abnormal; Notable for the following:    Prothrombin Time 33.4 (*)     INR 3.54 (*)     All other components within normal limits  APTT - Abnormal; Notable for the following:    aPTT 60 (*)     All other components within normal limits  CBC  WITH DIFFERENTIAL - Abnormal; Notable for the following:    WBC 12.9 (*)     Neutrophils Relative 92 (*)     Neutro Abs 11.9 (*)     Lymphocytes Relative 5 (*)     Lymphs Abs 0.6 (*)     All other components within normal limits  BASIC METABOLIC PANEL - Abnormal; Notable for the following:    Glucose, Bld 161 (*)     GFR calc non Af Amer 58 (*)     GFR calc Af Amer 68 (*)     All other components within normal limits  URINALYSIS, ROUTINE W REFLEX MICROSCOPIC   Dg Chest 2 View  10/25/2012  *RADIOLOGY REPORT*  Clinical Data: Fall.  Shortness of breath with history of COPD.  CHEST - 2 VIEW  Comparison: 06/10/2012  Findings: Stable emphysematous lung disease.  No pneumothorax, pulmonary consolidation, edema or pleural fluid is identified. Cardiac and mediastinal contours are within normal limits.  The visualized bony structures are unremarkable.  IMPRESSION: No active disease.  Stable emphysema.   Original Report Authenticated By: Irish Lack, M.D.    Dg Pelvis 1-2 Views  10/25/2012  *RADIOLOGY REPORT*  Clinical Data: Fall  PELVIS - 1-2 VIEW  Comparison: None.  Findings: No acute fracture and no dislocation.  Unremarkable soft tissues.  IMPRESSION: No acute bony pathology.   Original Report Authenticated By: Jolaine Click, M.D.    Dg Hip Complete Right  10/25/2012  *RADIOLOGY REPORT*  Clinical Data: Fall with right hip pain.  RIGHT HIP - COMPLETE 2+ VIEW  Comparison: None.  Findings: No acute fracture or dislocation.  Soft tissues are unremarkable.  No significant arthropathy.  Bony  pelvis appears normal.  IMPRESSION: No acute hip fracture.   Original Report Authenticated By: Irish Lack, M.D.      1. Traumatic hematoma of buttock   2. COPD exacerbation   3. Coagulopathy       MDM  The patient does not have any signs of head injury. She has x-rays of her chest and her pelvis which did not show any acute fractures. She is hypercoagulable at this time with an INR of 3.5, she is hypoxic and is wheezing with COPD. She will need continuous albuterol therapy, prednisone, laboratory workup to make sure that she is not anemic nor does she have any abnormal electrolyte or renal function. Labs pending at this time.  Pt has improved signfiicant after continuous neb, she has minimal wheezing, improved lung sounds and sat's of 98% on her home O2.  She has been instructed to hold coumadin tonight and start prednisone, z-pak and percocet PRN, she requests d/c, appears stabilized at this time.      Vida Roller, MD 10/25/12 [phone]

## 2012-10-25 NOTE — ED Notes (Addendum)
Pt presents with multiple falls x 1 week.  Most recent fall, pt attempted to get off commode, tripping on pajama pants leg.  Pt reports falling onto buttocks, denies any LOC.  Husband reports large bruising to R hip, pt is able to bear weight with assistance with walker, pt on coumadin. Since falling, pt is more short of breath, has had to increase home O2.

## 2012-11-04 ENCOUNTER — Ambulatory Visit: Payer: Medicare Other

## 2012-11-08 ENCOUNTER — Ambulatory Visit (INDEPENDENT_AMBULATORY_CARE_PROVIDER_SITE_OTHER): Payer: Medicare Other

## 2012-11-08 DIAGNOSIS — J309 Allergic rhinitis, unspecified: Secondary | ICD-10-CM

## 2012-11-11 DIAGNOSIS — J45909 Unspecified asthma, uncomplicated: Secondary | ICD-10-CM

## 2012-11-11 MED ORDER — OMALIZUMAB 150 MG ~~LOC~~ SOLR
225.0000 mg | Freq: Once | SUBCUTANEOUS | Status: AC
  Administered 2012-11-11: 225 mg via SUBCUTANEOUS

## 2012-11-13 ENCOUNTER — Telehealth: Payer: Self-pay | Admitting: Internal Medicine

## 2012-11-13 NOTE — Telephone Encounter (Signed)
New Problem:    Patient called in wanting to stop taking her flecainide (TAMBOCOR) 50 MG tablet because she is experiencing some adverse side effects.  Please call back.

## 2012-11-13 NOTE — Telephone Encounter (Signed)
OK to stop flecainide and follow

## 2012-11-14 NOTE — Telephone Encounter (Signed)
Dr Johney Frame saild ok to stop and we will follow and [name] will call me back with how [name] is feeling

## 2012-11-28 ENCOUNTER — Telehealth: Payer: Self-pay | Admitting: Internal Medicine

## 2012-11-28 MED ORDER — THEOPHYLLINE ER 300 MG PO TB12: 150.0000 mg | ORAL_TABLET | Freq: Two times a day (BID) | ORAL | Status: DC

## 2012-11-28 NOTE — Telephone Encounter (Signed)
Spoke with pt requesting rx for theophylline  rx sent nothing further needed

## 2012-12-06 ENCOUNTER — Ambulatory Visit (INDEPENDENT_AMBULATORY_CARE_PROVIDER_SITE_OTHER): Payer: Medicare Other

## 2012-12-06 DIAGNOSIS — J309 Allergic rhinitis, unspecified: Secondary | ICD-10-CM

## 2012-12-06 DIAGNOSIS — J45909 Unspecified asthma, uncomplicated: Secondary | ICD-10-CM

## 2012-12-10 MED ORDER — OMALIZUMAB 150 MG ~~LOC~~ SOLR
225.0000 mg | Freq: Once | SUBCUTANEOUS | Status: AC
  Administered 2012-12-10: 225 mg via SUBCUTANEOUS

## 2012-12-25 ENCOUNTER — Ambulatory Visit (INDEPENDENT_AMBULATORY_CARE_PROVIDER_SITE_OTHER): Payer: Medicare Other

## 2012-12-25 ENCOUNTER — Telehealth: Payer: Self-pay | Admitting: Internal Medicine

## 2012-12-25 ENCOUNTER — Emergency Department (HOSPITAL_COMMUNITY): Payer: Medicare Other

## 2012-12-25 ENCOUNTER — Emergency Department (HOSPITAL_COMMUNITY)
Admission: EM | Admit: 2012-12-25 | Discharge: 2012-12-25 | Disposition: A | Discharge status: Home / Self Care | Payer: Medicare Other | Attending: Emergency Medicine | Admitting: Emergency Medicine

## 2012-12-25 ENCOUNTER — Encounter (HOSPITAL_COMMUNITY): Payer: Self-pay | Admitting: Emergency Medicine

## 2012-12-25 DIAGNOSIS — Z923 Personal history of irradiation: Secondary | ICD-10-CM | POA: Insufficient documentation

## 2012-12-25 DIAGNOSIS — Z87891 Personal history of nicotine dependence: Secondary | ICD-10-CM | POA: Insufficient documentation

## 2012-12-25 DIAGNOSIS — Z7901 Long term (current) use of anticoagulants: Secondary | ICD-10-CM | POA: Insufficient documentation

## 2012-12-25 DIAGNOSIS — R600 Localized edema: Secondary | ICD-10-CM

## 2012-12-25 DIAGNOSIS — G4733 Obstructive sleep apnea (adult) (pediatric): Secondary | ICD-10-CM | POA: Insufficient documentation

## 2012-12-25 DIAGNOSIS — R05 Cough: Secondary | ICD-10-CM | POA: Insufficient documentation

## 2012-12-25 DIAGNOSIS — R059 Cough, unspecified: Secondary | ICD-10-CM | POA: Insufficient documentation

## 2012-12-25 DIAGNOSIS — Z85118 Personal history of other malignant neoplasm of bronchus and lung: Secondary | ICD-10-CM | POA: Insufficient documentation

## 2012-12-25 DIAGNOSIS — Z8679 Personal history of other diseases of the circulatory system: Secondary | ICD-10-CM | POA: Insufficient documentation

## 2012-12-25 DIAGNOSIS — J309 Allergic rhinitis, unspecified: Secondary | ICD-10-CM

## 2012-12-25 DIAGNOSIS — Z79899 Other long term (current) drug therapy: Secondary | ICD-10-CM | POA: Insufficient documentation

## 2012-12-25 DIAGNOSIS — J449 Chronic obstructive pulmonary disease, unspecified: Secondary | ICD-10-CM | POA: Insufficient documentation

## 2012-12-25 DIAGNOSIS — F3289 Other specified depressive episodes: Secondary | ICD-10-CM | POA: Insufficient documentation

## 2012-12-25 DIAGNOSIS — J4489 Other specified chronic obstructive pulmonary disease: Secondary | ICD-10-CM | POA: Insufficient documentation

## 2012-12-25 DIAGNOSIS — Z8709 Personal history of other diseases of the respiratory system: Secondary | ICD-10-CM | POA: Insufficient documentation

## 2012-12-25 DIAGNOSIS — IMO0002 Reserved for concepts with insufficient information to code with codable children: Secondary | ICD-10-CM | POA: Insufficient documentation

## 2012-12-25 DIAGNOSIS — R509 Fever, unspecified: Secondary | ICD-10-CM | POA: Insufficient documentation

## 2012-12-25 DIAGNOSIS — E039 Hypothyroidism, unspecified: Secondary | ICD-10-CM | POA: Insufficient documentation

## 2012-12-25 DIAGNOSIS — F411 Generalized anxiety disorder: Secondary | ICD-10-CM | POA: Insufficient documentation

## 2012-12-25 DIAGNOSIS — R609 Edema, unspecified: Secondary | ICD-10-CM | POA: Insufficient documentation

## 2012-12-25 DIAGNOSIS — J45909 Unspecified asthma, uncomplicated: Secondary | ICD-10-CM

## 2012-12-25 DIAGNOSIS — F329 Major depressive disorder, single episode, unspecified: Secondary | ICD-10-CM | POA: Insufficient documentation

## 2012-12-25 LAB — POCT I-STAT, CHEM 8
HCT: 39 % (ref 36.0–46.0)
Hemoglobin: 13.3 g/dL (ref 12.0–15.0)
Sodium: 142 mEq/L (ref 135–145)
TCO2: 27 mmol/L (ref 0–100)

## 2012-12-25 MED ORDER — FUROSEMIDE 20 MG PO TABS: 20.0000 mg | ORAL_TABLET | Freq: Two times a day (BID) | ORAL | Status: DC

## 2012-12-25 MED ORDER — OMALIZUMAB 150 MG ~~LOC~~ SOLR
225.0000 mg | Freq: Once | SUBCUTANEOUS | Status: AC
  Administered 2012-12-25: 225 mg via SUBCUTANEOUS

## 2012-12-25 NOTE — ED Notes (Signed)
Pt ambulated to BR on 4L 02 by [place], pt states she has gained 4-5 lbs in last few days. Pt is not currently on diuretics. Pt did desat to 78% while changing 02 to wall. Quickly up to 90% on 4L.

## 2012-12-25 NOTE — Telephone Encounter (Signed)
Noted  

## 2012-12-25 NOTE — ED Notes (Signed)
Sob, copd pt has increased her oxygen to 4 l still having sob, cough, fever,

## 2012-12-25 NOTE — ED Provider Notes (Signed)
History     CSN: [id number]  Arrival date & time 12/25/12  1650   First MD Initiated Contact with Patient 12/25/12 1706      Chief Complaint  Patient presents with  . Shortness of Breath  . Cough  . Fever     HPI Sob, copd pt has increased her oxygen to 4 l still having sob, cough, fever, associated with some peripheral edema which is worse in the day gets better at night.  Patient denies fever chills.  Past Medical History  Diagnosis Date  . Unspecified sinusitis (chronic)   . Obstructive sleep apnea   . COPD (chronic obstructive pulmonary disease)     on home O2, chronic steroid use  . Asthma   . Lung nodule   . SVT (supraventricular tachycardia)   . Atrial flutter     typical appearing  . Hypothyroidism   . GERD (gastroesophageal reflux disease)   . Anxiety   . Depression   . History of radiation therapy 11/08/2009-11/18/2009    left upper lung  . Allergy   . Cancer     lung ca s/p XRT 2011  . Lung cancer     non-small cell ca let upper lung stage IA    Past Surgical History  Procedure Laterality Date  . Hysterectomy      abdominal  . Tonsillectomy    . Cystectomy      removal from scalp  . Bladder repair      bladder tack  . Appendectomy    . Lung biopsy      Needle bx lung nodule - atypical cells- complicated by PTX/ chest tube  . Abdominal hysterectomy    . Svt ablation  9//26/13    AVNRT and CTI ablation by Dr Johney Frame    Family History  Problem Relation Age of Onset  . Heart disease Mother 46    History  Substance Use Topics  . Smoking status: Former Smoker -- 2.00 packs/day for 40 years    Types: Cigarettes    Quit date: 09/18/1986  . Smokeless tobacco: Never Used  . Alcohol Use: No    OB History   Grav Para Term Preterm Abortions TAB SAB Ect Mult Living                  Review of Systems  Constitutional: Negative for fever.    Allergies  Clarithromycin  Home Medications   Current Outpatient Rx  Name  Route  Sig  Dispense   Refill  . albuterol (PROVENTIL HFA;VENTOLIN HFA) 108 (90 BASE) MCG/ACT inhaler   Inhalation   Inhale 2 puffs into the lungs every 6 (six) hours as needed. For shortness of breath         . azithromycin (ZITHROMAX Z-PAK) 250 MG tablet   Oral   Take 1 tablet (250 mg total) by mouth daily. 500mg  PO day 1, then 250mg  PO days 205   6 tablet   0   . buPROPion (WELLBUTRIN SR) 150 MG 12 hr tablet   Oral   Take 150 mg by mouth 2 (two) times daily.          Marland Kitchen diltiazem (CARDIZEM CD) 240 MG 24 hr capsule   Oral   Take 1 capsule (240 mg total) by mouth daily.         Marland Kitchen EPINEPHrine (EPIPEN) 0.3 mg/0.3 mL DEVI   Intramuscular   Inject 0.3 mg into the muscle as needed. For allergic reactions         .  estrogens conjugated, synthetic A, (CENESTIN) 0.45 MG tablet   Oral   Take 0.45 mg by mouth daily. Take once daily for 21 days, then off for 7 days.  The 25th of each month is the start of 7 days off         . flecainide (TAMBOCOR) 50 MG tablet   Oral   Take 50 mg by mouth 2 (two) times daily.          . fluticasone (FLONASE) 50 MCG/ACT nasal spray   Nasal   Place 2 sprays into the nose daily.   1 g   11   . Fluticasone-Salmeterol (ADVAIR) 250-50 MCG/DOSE AEPB   Inhalation   Inhale 1 puff into the lungs every 12 (twelve) hours.   60 each   6   . guaiFENesin (MUCINEX) 600 MG 12 hr tablet   Oral   Take 600 mg by mouth every 12 (twelve) hours as needed. For congestion         . HYDROcodone-homatropine (HYCODAN) 5-1.5 MG/5ML syrup   Oral   Take 5 mLs by mouth every 6 (six) hours as needed for cough.   200 mL   0   . ipratropium-albuterol (DUONEB) 0.5-2.5 (3) MG/3ML SOLN   Nebulization   Take 3 mLs by nebulization every 6 (six) hours as needed. For shortness of breath         . levothyroxine (SYNTHROID, LEVOTHROID) 75 MCG tablet   Oral   Take 75 mcg by mouth daily.         . montelukast (SINGULAIR) 10 MG tablet   Oral   Take 1 tablet (10 mg total) by mouth at  bedtime.   30 tablet   5   . NON FORMULARY      CPAP          . NON FORMULARY      Oxygen 2-3 LPM          . omalizumab (XOLAIR) 150 MG injection   Subcutaneous   Inject 225 mg into the skin every 14 (fourteen) days. Friday         . oxyCODONE-acetaminophen (PERCOCET) 5-325 MG per tablet   Oral   Take 1 tablet by mouth every 4 (four) hours as needed for pain.   20 tablet   0   . predniSONE (DELTASONE) 20 MG tablet   Oral   Take 2 tablets (40 mg total) by mouth daily.   10 tablet   0   . predniSONE (DELTASONE) 5 MG tablet   Oral   Take 1 tablet (5 mg total) by mouth daily.   90 tablet   1   . risedronate (ACTONEL) 35 MG tablet   Oral   Take 35 mg by mouth every 7 (seven) days. with water on empty stomach, nothing by mouth or lie down for next 30 minutes; takes on Saturdays         . theophylline (THEODUR) 300 MG 12 hr tablet   Oral   Take 0.5 tablets (150 mg total) by mouth 2 (two) times daily.   30 tablet   3   . tiotropium (SPIRIVA) 18 MCG inhalation capsule   Inhalation   Place 18 mcg into inhaler and inhale daily.         . traMADol (ULTRAM) 50 MG tablet   Oral   Take 1 tablet by mouth 4 (four) times daily as needed.         . warfarin (COUMADIN) 1 MG  tablet   Oral   Take 2-3 mg by mouth daily. Takes 2mg  (2 tabs) daily except 3mg  (3 tabs) on Monday, Wednesday, Friday         . zolpidem (AMBIEN) 5 MG tablet   Oral   Take 2.5 mg by mouth at bedtime as needed. For sleep           BP 131/63  Pulse 110  Temp(Src) 99 F (37.2 C) (Oral)  Resp 25  SpO2 97%  Physical Exam  Nursing note and vitals reviewed. Constitutional: She is oriented to person, place, and time. She appears well-developed and well-nourished. No distress.  HENT:  Head: Normocephalic and atraumatic.  Eyes: Pupils are equal, round, and reactive to light.  Neck: Normal range of motion.  Cardiovascular: Normal rate and intact distal pulses.   Pulmonary/Chest: No  respiratory distress. She has wheezes (mild) in the right middle field and the left middle field.  Abdominal: Normal appearance. She exhibits no distension. There is no tenderness.  Musculoskeletal: Normal range of motion.  Neurological: She is alert and oriented to person, place, and time. No cranial nerve deficit.  Skin: Skin is warm and dry. No rash noted.  Psychiatric: She has a normal mood and affect. Her behavior is normal.    ED Course  Procedures (including critical care time)  Date: 12/25/2012  Rate: 102  Rhythm: Sinus tachycardia  QRS Axis: normal  Intervals: normal  ST/T Wave abnormalities: Borderline T wave abnormalities  Conduction Disutrbances: none  Narrative Interpretation: Abnormal EKG     Labs Reviewed  PRO B NATRIURETIC PEPTIDE - Abnormal; Notable for the following:    Pro B Natriuretic peptide (BNP) 159.4 (*)    All other components within normal limits  POCT I-STAT, CHEM 8 - Abnormal; Notable for the following:    Glucose, Bld 104 (*)    All other components within normal limits   Dg Chest 2 View  12/25/2012  *RADIOLOGY REPORT*  Clinical Data: Cough.  Shortness of breath and fever.  CHEST - 2 VIEW  Comparison: 10/25/2012  Findings: Normal heart size.  No pleural effusion or edema noted. There is no airspace consolidation identified. Chronic interstitial change of emphysema noted.  Right apical architectural distortion is again noted and appears similar to previous exam.  Review of the visualized osseous structures is significant for a compression deformity within the lower thoracic spine.  This is stable from previous exam.  IMPRESSION:  1.  No active disease.   Original Report Authenticated By: Signa Kell, M.D.      1. Pedal edema       MDM         Nelia Shi, MD 12/25/12 [phone]

## 2012-12-25 NOTE — Telephone Encounter (Signed)
Pt came in for Xolair injections today-pt was noticed to be having wheezing and SOB upon arrival by [facility] in allergy lab. Pt then stated her Portable pulsing O2 tank was out of O2. [place] Abts stated O2 sats of 86% 4L/M pulse and placed patient on 8L cont tank from our office. I noticed patient in allergy injection room having a hard time breathing. I noticed patient uses [facility] for her O2 needs and gave her an [facility] home tank-documented on forms for [facility] by [place] Abts, CMA and [place] Newcomer was called to inform. Pt was placed on [facility] tank at 4L/M continuous. I spoke with patient and got more information of her symptoms. Pt stated she was having increased SOB and wheezing with cough-productive at times and white in color x 3 days; pt also stated she noticed edema in both legs and ankles as well. I called and spoke with CY personally about patient. She is not on a diuretic and therefore CY felt more comfortable if patient went to [facility] ER to be evaluated. Pt was escorted to the car (husband driving) and I explained the above to Mr Keehan who stated he would go ahead and take patient to ER.    Mr and Mrs Klinck are both aware to take [facility] tank to their home no matter if patient is admitted to hospital or not. I then left a message on Melissa Stenson's cell number to inform her. This way [facility] will arrange pick up of their tank with patient and family.   Pt did receive Xolair injections today by Dimas Millin.

## 2012-12-26 ENCOUNTER — Telehealth: Payer: Self-pay | Admitting: Internal Medicine

## 2012-12-26 NOTE — Telephone Encounter (Signed)
Will hold in my box until I receive forms.

## 2012-12-31 NOTE — Telephone Encounter (Signed)
Will forward msg to Katie to watch out for this fax.

## 2012-12-31 NOTE — Telephone Encounter (Signed)
Left message for [name] (female) to call back-I have not received any papers on patient. Gave it several days and nothing has come through. I will need for [name] to refax the papers to Triage to my attention. Thanks.

## 2012-12-31 NOTE — Telephone Encounter (Signed)
[name] aware to refax papers.[name]'

## 2012-12-31 NOTE — Telephone Encounter (Signed)
Katie did you receive forms thanks

## 2013-01-02 NOTE — Telephone Encounter (Signed)
Form received Date crossed out with single line Discussed with Katie and form given to her to have CY initial and date Will forward message to [facility] and Florentina Addison to follow up on

## 2013-01-02 NOTE — Telephone Encounter (Signed)
Looked on [employer] No sign of anything on this patient Concha Norway, spoke with Sherrlyn Hock He has been faxing to the [phone] Asked him to fax it to the triage fax machine  Per Sherrlyn Hock, all that needs to be done is at the top in Section A where it mentions " initial date" and has 3.12.12 CY needs to single-line this out, mark the last ov date (12.23.13) and initial and date it.  Per Sherrlyn Hock, this is all that is needed.  Will hold in triage to await fax.

## 2013-01-02 NOTE — Telephone Encounter (Signed)
[name] called & would like to known if fax was rec'd yet.  Please leave a message & advise. Thanks! Antionette Fairy

## 2013-01-02 NOTE — Telephone Encounter (Signed)
Paper given to [name] and [name] as they are working with him today; will have CY make corrections and I will fax back today.

## 2013-01-02 NOTE — Telephone Encounter (Signed)
[name] brought fax back to me and I have faxed back with corrections; nothing more needed at this time.

## 2013-01-17 ENCOUNTER — Ambulatory Visit (INDEPENDENT_AMBULATORY_CARE_PROVIDER_SITE_OTHER): Payer: Medicare Other

## 2013-01-17 DIAGNOSIS — J45909 Unspecified asthma, uncomplicated: Secondary | ICD-10-CM

## 2013-01-17 DIAGNOSIS — J309 Allergic rhinitis, unspecified: Secondary | ICD-10-CM

## 2013-01-21 MED ORDER — OMALIZUMAB 150 MG ~~LOC~~ SOLR
225.0000 mg | Freq: Once | SUBCUTANEOUS | Status: AC
  Administered 2013-01-21: 225 mg via SUBCUTANEOUS

## 2013-01-31 ENCOUNTER — Ambulatory Visit: Payer: Medicare Other

## 2013-02-07 ENCOUNTER — Ambulatory Visit (INDEPENDENT_AMBULATORY_CARE_PROVIDER_SITE_OTHER): Payer: Medicare Other

## 2013-02-07 DIAGNOSIS — J309 Allergic rhinitis, unspecified: Secondary | ICD-10-CM

## 2013-02-07 DIAGNOSIS — J45909 Unspecified asthma, uncomplicated: Secondary | ICD-10-CM

## 2013-02-12 MED ORDER — OMALIZUMAB 150 MG ~~LOC~~ SOLR
225.0000 mg | Freq: Once | SUBCUTANEOUS | Status: AC
  Administered 2013-02-12: 225 mg via SUBCUTANEOUS

## 2013-02-19 ENCOUNTER — Other Ambulatory Visit: Payer: Self-pay | Admitting: Internal Medicine

## 2013-02-19 MED ORDER — PREDNISONE 5 MG PO TABS: 5.0000 mg | ORAL_TABLET | Freq: Every day | ORAL | Status: DC

## 2013-02-21 ENCOUNTER — Ambulatory Visit: Payer: Medicare Other

## 2013-03-04 ENCOUNTER — Ambulatory Visit: Payer: Medicare Other

## 2013-03-04 ENCOUNTER — Ambulatory Visit (INDEPENDENT_AMBULATORY_CARE_PROVIDER_SITE_OTHER): Payer: Medicare Other

## 2013-03-04 DIAGNOSIS — J45909 Unspecified asthma, uncomplicated: Secondary | ICD-10-CM

## 2013-03-04 DIAGNOSIS — J309 Allergic rhinitis, unspecified: Secondary | ICD-10-CM

## 2013-03-05 ENCOUNTER — Telehealth: Payer: Self-pay | Admitting: *Deleted

## 2013-03-05 MED ORDER — OMALIZUMAB 150 MG ~~LOC~~ SOLR
225.0000 mg | Freq: Once | SUBCUTANEOUS | Status: AC
  Administered 2013-03-05: 225 mg via SUBCUTANEOUS

## 2013-03-05 NOTE — Telephone Encounter (Signed)
CALLED PATIENT TO ASK QUESTION, LVM FOR A RETURN CALL 

## 2013-03-06 ENCOUNTER — Ambulatory Visit
Admission: RE | Admit: 2013-03-06 | Discharge: 2013-03-06 | Disposition: A | Discharge status: Home / Self Care | Payer: Medicare Other | Source: Ambulatory Visit | Attending: Radiation Oncology | Admitting: Radiation Oncology

## 2013-03-06 ENCOUNTER — Ambulatory Visit (HOSPITAL_COMMUNITY)
Admission: RE | Admit: 2013-03-06 | Discharge: 2013-03-06 | Disposition: A | Discharge status: Home / Self Care | Payer: Medicare Other | Source: Ambulatory Visit | Attending: Radiation Oncology | Admitting: Radiation Oncology

## 2013-03-06 ENCOUNTER — Encounter: Payer: Self-pay | Admitting: Radiation Oncology

## 2013-03-06 DIAGNOSIS — C7952 Secondary malignant neoplasm of bone marrow: Secondary | ICD-10-CM | POA: Insufficient documentation

## 2013-03-06 DIAGNOSIS — C7951 Secondary malignant neoplasm of bone: Secondary | ICD-10-CM | POA: Insufficient documentation

## 2013-03-06 DIAGNOSIS — Z923 Personal history of irradiation: Secondary | ICD-10-CM | POA: Insufficient documentation

## 2013-03-06 DIAGNOSIS — C3490 Malignant neoplasm of unspecified part of unspecified bronchus or lung: Secondary | ICD-10-CM | POA: Insufficient documentation

## 2013-03-06 NOTE — Progress Notes (Signed)
Follow  SRS non small cell lung, CT chest results today, oxygen 4 Liters=92% , pateint in w/c, coughs up mostly white/green [pheglm last night, stated wheezing, using inhalers, and nebulizer, also, appetite good, very sob with mild exertion, drinking plenty fluids, denys pain, nno nausea 3:13 PM

## 2013-03-06 NOTE — Progress Notes (Signed)
Radiation Oncology         [phone] ________________________________  Name: [name] MRN: [id number]  Date: 03/06/2013  DOB: 1936-01-16  Follow-Up Visit Note  CC: Georgann Housekeeper, MD  Georgann Housekeeper, MD  Diagnosis:   77 year old woman status post stereotactic body radiotherapy for clinical stage I non-small-cell carcinoma of the lung  Interval Since Last Radiation:  3  years  Narrative:  The patient returns today for routine follow-up.  She continues to several with baseline dyspnea which is essentially unchanged. She had a followup CT scan described below.                               ALLERGIES:  is allergic to clarithromycin.  Meds: Current Outpatient Prescriptions  Medication Sig Dispense Refill  . albuterol (PROVENTIL HFA;VENTOLIN HFA) 108 (90 BASE) MCG/ACT inhaler Inhale 2 puffs into the lungs every 6 (six) hours as needed. For shortness of breath      . buPROPion (WELLBUTRIN SR) 150 MG 12 hr tablet Take 150 mg by mouth 2 (two) times daily.       Marland Kitchen diltiazem (CARDIZEM CD) 240 MG 24 hr capsule Take 1 capsule (240 mg total) by mouth daily.      Marland Kitchen EPINEPHrine (EPIPEN) 0.3 mg/0.3 mL DEVI Inject 0.3 mg into the muscle as needed. For allergic reactions      . estrogens conjugated, synthetic A, (CENESTIN) 0.45 MG tablet Take 0.45 mg by mouth daily. Take once daily for 21 days, then off for 7 days.  The 25th of each month is the start of 7 days off      . flecainide (TAMBOCOR) 50 MG tablet Take 50 mg by mouth 2 (two) times daily.       . Fluticasone-Salmeterol (ADVAIR) 250-50 MCG/DOSE AEPB Inhale 1 puff into the lungs every 12 (twelve) hours.  60 each  6  . guaiFENesin (MUCINEX) 600 MG 12 hr tablet Take 600 mg by mouth every 12 (twelve) hours as needed. For congestion      . HYDROcodone-homatropine (HYCODAN) 5-1.5 MG/5ML syrup Take 5 mLs by mouth every 6 (six) hours as needed for cough.  200 mL  0  . ipratropium-albuterol (DUONEB) 0.5-2.5 (3) MG/3ML SOLN Take 3 mLs by  nebulization every 6 (six) hours as needed. For shortness of breath      . levothyroxine (SYNTHROID, LEVOTHROID) 75 MCG tablet Take 75 mcg by mouth daily.      . montelukast (SINGULAIR) 10 MG tablet Take 1 tablet (10 mg total) by mouth at bedtime.  30 tablet  5  . NON FORMULARY CPAP       . NON FORMULARY Oxygen 2-4 LPM      . omalizumab (XOLAIR) 150 MG injection Inject 225 mg into the skin every 14 (fourteen) days. Friday      . predniSONE (DELTASONE) 5 MG tablet Take 1 tablet (5 mg total) by mouth daily.  90 tablet  1  . risedronate (ACTONEL) 35 MG tablet Take 35 mg by mouth every 7 (seven) days. with water on empty stomach, nothing by mouth or lie down for next 30 minutes; takes on Saturdays      . theophylline (THEODUR) 300 MG 12 hr tablet Take 0.5 tablets (150 mg total) by mouth 2 (two) times daily.  30 tablet  3  . tiotropium (SPIRIVA) 18 MCG inhalation capsule Place 18 mcg into inhaler and inhale daily.      Marland Kitchen  warfarin (COUMADIN) 1 MG tablet Take 2-3 mg by mouth daily. Takes 2mg  (2 tabs) daily except 3mg  (3 tabs) on Monday, Wednesday, Friday      . zolpidem (AMBIEN) 5 MG tablet Take 2.5 mg by mouth at bedtime as needed. For sleep      . oxyCODONE-acetaminophen (PERCOCET) 5-325 MG per tablet Take 1 tablet by mouth every 4 (four) hours as needed for pain.  20 tablet  0  . traMADol (ULTRAM) 50 MG tablet Take 1 tablet by mouth 4 (four) times daily as needed (pain).        No current facility-administered medications for this encounter.   Physical Findings: The patient is in no acute distress. Patient is alert and oriented.  weight is 155 lb 1.6 oz (70.353 kg). Her oral temperature is 99.2 F (37.3 C). Her blood pressure is 137/68 and her pulse is 97. Her respiration is 24 and oxygen saturation is 92%. .  No significant changes.  Lab Findings: Lab Results  Component Value Date   WBC 12.9* 10/25/2012   HGB 13.3 12/25/2012   HCT 39.0 12/25/2012   MCV 90.3 10/25/2012   PLT 296 10/25/2012    Radiographic Findings: Ct Chest Wo Contrast  03/06/2013   *RADIOLOGY REPORT*  Clinical Data: Lung cancer.  Status post SBRT  CT CHEST WITHOUT CONTRAST  Technique:  Multidetector CT imaging of the chest was performed following the standard protocol without IV contrast.  Comparison: 09/02/2012.  Findings: The chest wall is unremarkable and stable.  No breast masses, supraclavicular or axillary lymphadenopathy.  Examination of the bony thorax demonstrates stable metastatic disease involving the manubrium, T2 and T3.  The no new lesions are identified.  The heart is normal in size.  No pericardial effusion.  No mediastinal or hilar mass or adenopathy.  Small scattered lymph nodes are stable.  The aorta is normal in caliber.  Stable atherosclerotic changes.  The esophagus is grossly normal.  Examination of the lung parenchyma demonstrates a persistent area of soft tissue thickening at the left apex.  This appears relatively stable particularly when viewing the coronal and sagittal reformatted images.  The main portion the lesion measures 3.7 x 1.9 cm on image number seven this measures 3.8 x 1.8 cm on the prior study.  Stable underlying severe emphysematous changes with areas of pulmonary scarring.  I do not see any new lung lesions.  No acute pulmonary findings.  No pleural effusion.  The upper abdomen is unremarkable and stable.  IMPRESSION:  1.  Stable left apical scarring/radiation changes.  No CT findings to suggest residual, recurrent or metastatic disease. 2.  No mediastinal or hilar lymphadenopathy. 3.  Stable advanced emphysematous changes. 4.  Stable sclerotic bone lesions involving the manubrium, T2 and T3 vertebral bodies. 5.  Stable T12 compression fracture through   Original Report Authenticated By: Rudie Meyer, M.D.   Impression:  The patient has no evidence of recurrence 3 years following stereotactic body radiotherapy.  Plan:  Repeat CT of the chest in 1 year then  followup  _____________________________________  Artist Pais. Kathrynn Running, M.D.

## 2013-03-07 ENCOUNTER — Telehealth: Payer: Self-pay | Admitting: Internal Medicine

## 2013-03-07 ENCOUNTER — Telehealth: Payer: Self-pay | Admitting: *Deleted

## 2013-03-07 MED ORDER — PREDNISONE 10 MG PO TABS: ORAL_TABLET | ORAL | Status: DC

## 2013-03-07 NOTE — Telephone Encounter (Signed)
Pt c/o sinus drainage / cough w/ yellow "but mostly whitish" mucus x 1 day. T yesterday was 99 but no fever today - recs? [facility] [address]  Pt c/o increased sinus drainage, PND,  Wheezing, sob, cough with yellow mucus x 1 day. Pt reports fever of 99 last night but no fever today.  Pt having to use neb tid for relief  Pt has appt on Monday with CY but wants to be seen sooner or have something called into pharm.  [facility] Allergies  Allergen Reactions  . Clarithromycin     REACTION: nausea   Please advise Dr Maple Hudson if pt needs to be seen or Rx called in. Thanks.

## 2013-03-07 NOTE — Telephone Encounter (Signed)
Suggest prednisone 10 mg, # 20, 4 X 2 DAYS, 3 X 2 DAYS, 2 X 2 DAYS, 1 X 2 DAYS We will wait for now on antibiotic, since this is probably viral

## 2013-03-07 NOTE — Telephone Encounter (Signed)
CALLED PATIENT TO INFORM OF TEST AND FU VISIT, LVM FOR A RETURN CALL 

## 2013-03-07 NOTE — Telephone Encounter (Signed)
Pt advised and rx sent. Jennifer Castillo, CMA  

## 2013-03-10 ENCOUNTER — Ambulatory Visit (INDEPENDENT_AMBULATORY_CARE_PROVIDER_SITE_OTHER): Payer: Medicare Other | Admitting: Internal Medicine

## 2013-03-10 ENCOUNTER — Encounter: Payer: Self-pay | Admitting: Internal Medicine

## 2013-03-10 VITALS — BP 152/70 | HR 102 | Ht 65.0 in | Wt 155.6 lb

## 2013-03-10 DIAGNOSIS — J302 Other seasonal allergic rhinitis: Secondary | ICD-10-CM

## 2013-03-10 DIAGNOSIS — C3490 Malignant neoplasm of unspecified part of unspecified bronchus or lung: Secondary | ICD-10-CM

## 2013-03-10 DIAGNOSIS — J439 Emphysema, unspecified: Secondary | ICD-10-CM

## 2013-03-10 DIAGNOSIS — J438 Other emphysema: Secondary | ICD-10-CM

## 2013-03-10 DIAGNOSIS — J309 Allergic rhinitis, unspecified: Secondary | ICD-10-CM

## 2013-03-10 DIAGNOSIS — G4733 Obstructive sleep apnea (adult) (pediatric): Secondary | ICD-10-CM

## 2013-03-10 NOTE — Progress Notes (Signed)
Patient ID: [name], female    DOB: Dec 28, 1935, 77 y.o.   MRN: [id number]  HPI 75/12- 76 yo former smoker with severe COPD/ chronic hypoxic respiratory failure, hx lung nodule treated with XRT as presumptive Ca w/o bx. OSA  Husband here. Last here November 28, 2010. Since then was hosp for tachyarrythmia- considered for ablation. She was concerned about risk of being put to sleep and wanted to discuss it here.  Now feels sore at strap level around chest, very congested and wheezy. Nebulizer helps but needing frequently. Malaise. Sputum white, no fever. Feet feel still a little swollen. Continues prednisone 5 mg every other day.   06/02/11- 35 yo former smoker with severe COPD/ chronic hypoxic respiratory failure, hx lung nodule treated with XRT as presumptive Ca w/o bx. OSA  Doing better since last visit. Less drainage and less cough. Breathing comfortable at rest on room air but not with any exertion. Theophylline level low 03/15/11- not likely to contribute to irregular heart beat.  Continues O2, low dose prednisone and Xolair.   10/03/11-  31 yo former smoker with severe COPD/ chronic hypoxic respiratory failure, hx lung nodule treated with XRT as presumptive Ca w/o bx, OSA Husband here. More increased SOB than usual and pain in back and chest area over past 2-3 days(recently had flu) Heart rate went up to 150s an hour or two after pain started. Had to double up on ditiazem due to increased heart palpitation-CP stopped once med taken. She reported this to her cardiology office, who referred her here. Today she feels back to normal, just tired.   Sleep Apnea -Wears CPAP for approx 6-8 hours at night.  11/10/11- 47 yo former smoker with severe COPD/ chronic hypoxic respiratory failure, hx lung nodule treated with XRT as presumptive Ca w/o bx, OSA Husband here. Acute visit- C/O: pain in upper back and around sides(pain moves); consistent pain but more intense with inhaling.    02/01/12-  3 yo  former smoker with severe COPD/ chronic hypoxic respiratory failure, hx lung nodule treated with XRT as presumptive Ca w/o bx, OSA    Husband here  Has to stop and rest more than usual; has turned O2 up to 3.5 and feels better In the past 2 or 3 weeks has had increased dyspnea on exertion with no abrupt change. Continues maintenance prednisone 5 mg daily. Uses nebulizer less than once per day. Some tachycardia palpitation and has a history of SVT. There has been some discussion of an ablation procedure. She continues allergy vaccine and feels that has helped her through the spring. CT 12/14/11 reviewed images with them. IMPRESSION:  Increasing paramediastinal nodularity along the medial left upper  lobe. While this may reflect fibrosis related to prior radiation,  given the history of XRT completion in 2011, the interval change is  worrisome. PET-CT is suggested for further evaluation.  Underlying moderate centrilobular and paraseptal emphysema with  stable bullous changes in the right lung apex.  These results will be called to the ordering clinician or  representative by the Radiologist Assistant, and communication  documented in the PACS Dashboard.  Original Report Authenticated By: Julian Hy, M.D.    05/09/12- 89 yo former smoker with severe COPD/ chronic hypoxic respiratory failure, hx lung nodule treated with XRT as presumptive Ca w/o bx, OSA ., Hx SVT    Recent acute illness: Slight fever, SOB, and cough. Has sinus flare up , with nausea, myalgias, nasal congestion and watery rhinorrhea. Sister had  similar illness. Still on prednisone 5 mg daily and oxygen 3.5 L/Advanced.  History of SVT and notices occasional tachycardia palpitation. Nodular prominence left upper lobe suspicious for tumor recurrence. She has followup chest CT and appointment pending with Dr. Kathrynn Running Rad.Onc. OSA/ CPAP doing well with no changes or concerns. PET 02/13/12- images reviewed with her IMPRESSION:   Continued progression of abnormal soft tissue/nodularity in the  medial left upper lung, now measuring approximately 2.6 x 1.5 cm,  mildly FDG avid with max SUV 4.4. This appearance is suspicious  for tumor recurrence.  No evidence of metastatic disease.  Original Report Authenticated By: Charline Bills, M.D.    09/09/12- 70 yo former smoker with severe COPD/ chronic hypoxic respiratory failure, hx lung nodule treated with XRT as presumptive Ca w/o bx, OSA ., Hx SVT FOLLOWS FOR: chest congestion(using Mucinex), wheezing, and more SOB than usual Ran out of [place] for 5 days and just restarted a week ago. Began again increased chest congestion. Some dry throat clearing and wheeze but no fever or sore throat. Had fallen and bruised her chest. Continues prednisone 5 mg daily Had ablation for heart rhythm-successful. Xolair continues to help. CT chest 09/02/12 reviewed with her IMPRESSION:  1. 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  2. Interval sclerosis and pathologic fracture of the manubrium.  This may represent blastic bone metastasis. Alternatively these  findings may represent osteonecrosis secondary to post radiation  therapy.  3. Stable appearance of the compression fractures and abnormal  increase sclerosis involving the T2 and T3 vertebra. 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 Cannot rule  out sclerotic bone metastasis with associated pathologic fractures.  4. Stable T12 compression fracture  Original Report Authenticated By: Signa Kell, M.D.   03/10/13- 56 yo former smoker with severe COPD/ chronic hypoxic respiratory failure, hx lung nodule treated with XRT as presumptive Ca w/o bx, OSA ., Hx SVT FOLLOWS FOR: cough-productive started out yellow but now clear in color since last week-was given pred taper. SOB and wheezing-using nebulizer at home  around the clock. chest tightness and congestion. Fever last week as well. She had a cold last week and we called in prednisone taper. Slowly better. Fever gone. CT chest 03/06/13 IMPRESSION:  1. Stable left apical scarring/radiation changes. No CT findings  to suggest residual, recurrent or metastatic disease.  2. No mediastinal or hilar lymphadenopathy.  3. Stable advanced emphysematous changes.  4. Stable sclerotic bone lesions involving the manubrium, T2 and  T3 vertebral bodies.  5. Stable T12 compression fracture through  Original Report Authenticated By: Rudie Meyer, M.D.   Review of Systems- see HPI Constitutional:   No weight loss, night sweats, fevers, chills, fatigue, lassitude. HEENT:   No headaches,  Difficulty swallowing,  Tooth/dental problems,  Sore throat,                No sneezing, itching, ear ache, +nasal congestion, +post nasal drip,  CV:  No acute chest pain,  No-orthopnea, PND, swelling in lower extremities, anasarca, dizziness, palpitations GI  No heartburn, + indigestion,  No-abdominal pain, nausea, vomiting,  Resp:+ excess mucus, No coughing up of blood.  No change in color of mucus.  Skin: no rash or lesions. GU:  MS:  No joint pain or swelling. Marland Kitchen Psych:  No change in mood or affect. No depression or anxiety.  No memory loss.    Objective:  Physical Exam  General- Alert, Oriented, Affect-appropriate, Distress- none acute   O2 on 3.5 L/M Skin- rash-none, lesions- none, excoriation- none Lymphadenopathy- none Head- atraumatic            Eyes- Gross vision intact, PERRLA, conjunctivae clear secretions            Ears- Hearing, canals            Nose- + mucoid bridging, Septal dev,  polyps, erosion, perforation. Chronic nasal quality to speech.              Throat- Mallampati II , mucosa clear , drainage- none, tonsils- atrophic Neck- flexible , trachea midline, no stridor , thyroid nl, carotid no bruit Chest - symmetrical excursion , unlabored            Heart/CV- RRR , no murmur , no gallop  , no rub, nl s1 s2                           - JVD- none , edema- none, stasis changes- none, varices- none           Lung- + raspy cough with deep breath, decreased sounds  , dullness-none, rub- none, unlabored           Chest wall-  Abd-  Br/ Gen/ Rectal- Not done, not indicated Extrem- cyanosis- none, clubbing, none, atrophy- none, strength- nl Neuro- grossly intact to observation

## 2013-03-10 NOTE — Patient Instructions (Addendum)
Please call as needed 

## 2013-03-13 ENCOUNTER — Ambulatory Visit: Payer: Self-pay | Admitting: Radiation Oncology

## 2013-03-13 ENCOUNTER — Ambulatory Visit: Payer: Medicare Other | Admitting: Radiation Oncology

## 2013-03-19 ENCOUNTER — Telehealth: Payer: Self-pay | Admitting: Internal Medicine

## 2013-03-19 MED ORDER — DOXYCYCLINE HYCLATE 100 MG PO TABS: ORAL_TABLET | ORAL | Status: DC

## 2013-03-19 NOTE — Telephone Encounter (Signed)
Called spoke with patient who reports head/sinus congestion, PND, prod cough with white mucus, x3 weeks.  Pt denies f/c/s, increased SOB, increased wheezing.  She is using her nebulizer, sinus rinses, and mucinex.  Pt did the pred taper given in 6.20.14 phone note:  Waymon Budge, MD at 03/07/2013 12:46 PM     Suggest prednisone 10 mg, # 20, 4 X 2 DAYS, 3 X 2 DAYS, 2 X 2 DAYS, 1 X 2 DAYS  We will wait for now on antibiotic, since this is probably viral   Pt stated that she is no better since the taper or ov w/ CY on 6.23.14. Dr Maple Hudson please advise, thank you.  Allergies  Allergen Reactions  . Clarithromycin     REACTION: nausea

## 2013-03-19 NOTE — Telephone Encounter (Signed)
Per CY - did the pred taper help any?  Spoke to the pt. States that normally pred tapers help with her symptoms but this time it did not.  CY is aware of pt's response. He would like to send in Doxy 100mg  #8 2 tablets today, then 1 daily until gone.  Pt is aware of this. Rx will be sent to her pharmacy. Nothing further is needed.

## 2013-03-23 NOTE — Assessment & Plan Note (Signed)
Plan-continue Xolair, supplemental oxygen and bronchodilators

## 2013-03-23 NOTE — Assessment & Plan Note (Signed)
Good compliance and control with CPAP plus oxygen

## 2013-03-23 NOTE — Assessment & Plan Note (Signed)
She gets marginal benefit still from allergy vaccine

## 2013-03-23 NOTE — Assessment & Plan Note (Signed)
No evidence for recurrence at this time based on June/2014 CT chest

## 2013-03-31 ENCOUNTER — Other Ambulatory Visit: Payer: Self-pay | Admitting: Internal Medicine

## 2013-03-31 DIAGNOSIS — Z1231 Encounter for screening mammogram for malignant neoplasm of breast: Secondary | ICD-10-CM

## 2013-04-09 ENCOUNTER — Ambulatory Visit (INDEPENDENT_AMBULATORY_CARE_PROVIDER_SITE_OTHER): Payer: Medicare Other

## 2013-04-09 DIAGNOSIS — J309 Allergic rhinitis, unspecified: Secondary | ICD-10-CM

## 2013-04-09 DIAGNOSIS — J45909 Unspecified asthma, uncomplicated: Secondary | ICD-10-CM

## 2013-04-11 MED ORDER — OMALIZUMAB 150 MG ~~LOC~~ SOLR
225.0000 mg | Freq: Once | SUBCUTANEOUS | Status: AC
  Administered 2013-04-11: 225 mg via SUBCUTANEOUS

## 2013-04-18 ENCOUNTER — Ambulatory Visit: Payer: Medicare Other

## 2013-04-23 ENCOUNTER — Ambulatory Visit: Payer: Medicare Other

## 2013-04-24 ENCOUNTER — Ambulatory Visit (INDEPENDENT_AMBULATORY_CARE_PROVIDER_SITE_OTHER): Payer: Medicare Other

## 2013-04-24 ENCOUNTER — Other Ambulatory Visit: Payer: Self-pay | Admitting: *Deleted

## 2013-04-24 DIAGNOSIS — J309 Allergic rhinitis, unspecified: Secondary | ICD-10-CM

## 2013-04-24 DIAGNOSIS — J45909 Unspecified asthma, uncomplicated: Secondary | ICD-10-CM

## 2013-04-24 MED ORDER — THEOPHYLLINE ER 300 MG PO TB12: 150.0000 mg | ORAL_TABLET | Freq: Two times a day (BID) | ORAL | Status: DC

## 2013-04-24 MED ORDER — MONTELUKAST SODIUM 10 MG PO TABS: 10.0000 mg | ORAL_TABLET | Freq: Every day | ORAL | Status: DC

## 2013-04-24 NOTE — Telephone Encounter (Signed)
Pt's spouse in office to see CDY as pt.  C/o [facility] not receiving rx for pt, his spouse.   States [facility] has reported they have attempted to send request several times. Nothing in pt's chart requesting meds from pharm. He was unsure which meds were needed but did know there were 2 needed. I called [facility], spoke with Lawrence Marseilles, Was advised rxs needed for singulair and theophylline.  I gave VO for both.  Lawrence Marseilles verbalized understanding.  Spouse aware.

## 2013-04-28 MED ORDER — OMALIZUMAB 150 MG ~~LOC~~ SOLR
225.0000 mg | Freq: Once | SUBCUTANEOUS | Status: AC
  Administered 2013-04-28: 225 mg via SUBCUTANEOUS

## 2013-05-02 ENCOUNTER — Ambulatory Visit: Payer: Medicare Other

## 2013-05-08 ENCOUNTER — Ambulatory Visit: Payer: Medicare Other

## 2013-05-09 ENCOUNTER — Telehealth: Payer: Self-pay | Admitting: Internal Medicine

## 2013-05-09 NOTE — Telephone Encounter (Signed)
Will hold for CY to sign Rx's on Monday.

## 2013-05-12 MED ORDER — IPRATROPIUM-ALBUTEROL 0.5-2.5 (3) MG/3ML IN SOLN: 3.0000 mL | Freq: Four times a day (QID) | RESPIRATORY_TRACT | Status: DC | PRN

## 2013-05-12 NOTE — Telephone Encounter (Signed)
Rx signed and given to Bjorn Loser to give to [facility].

## 2013-05-16 ENCOUNTER — Ambulatory Visit (INDEPENDENT_AMBULATORY_CARE_PROVIDER_SITE_OTHER): Payer: Medicare Other

## 2013-05-16 DIAGNOSIS — J309 Allergic rhinitis, unspecified: Secondary | ICD-10-CM

## 2013-05-16 DIAGNOSIS — J45909 Unspecified asthma, uncomplicated: Secondary | ICD-10-CM

## 2013-05-22 ENCOUNTER — Ambulatory Visit
Admission: RE | Admit: 2013-05-22 | Discharge: 2013-05-22 | Disposition: A | Discharge status: Home / Self Care | Payer: Medicare Other | Source: Ambulatory Visit | Attending: Internal Medicine | Admitting: Internal Medicine

## 2013-05-22 DIAGNOSIS — Z1231 Encounter for screening mammogram for malignant neoplasm of breast: Secondary | ICD-10-CM

## 2013-05-22 MED ORDER — OMALIZUMAB 150 MG ~~LOC~~ SOLR
225.0000 mg | Freq: Once | SUBCUTANEOUS | Status: AC
  Administered 2013-05-22: 225 mg via SUBCUTANEOUS

## 2013-05-27 ENCOUNTER — Other Ambulatory Visit: Payer: Self-pay | Admitting: Internal Medicine

## 2013-05-27 DIAGNOSIS — R928 Other abnormal and inconclusive findings on diagnostic imaging of breast: Secondary | ICD-10-CM

## 2013-05-30 ENCOUNTER — Ambulatory Visit: Payer: Medicare Other

## 2013-05-31 IMAGING — CT CT CERVICAL SPINE W/O CM
1 of 7 series · 3 of 14 positions shown, 4 images · non-contrast
Comparison: Chest CT 05/30/2012.  Head CT 07/10/2005 paranasal
sinus CT 05/03/2005.

CT HEAD

CLINICAL DATA: 75-year-old female status post fall.  History of
lung cancer.

CT HEAD WITHOUT CONTRAST
CT CERVICAL SPINE WITHOUT CONTRAST
TECHNIQUE: Multidetector CT imaging of the head and cervical spine
was performed following the standard protocol without intravenous
contrast.  Multiplanar CT image reconstructions of the cervical
spine were also generated.

[Series 6: cervical spine · axial · 0.32mm/px · z∈[-309,-154]mm · 3 of 63 slices shown, 4 images]
[im 1/63  soft-tissue]
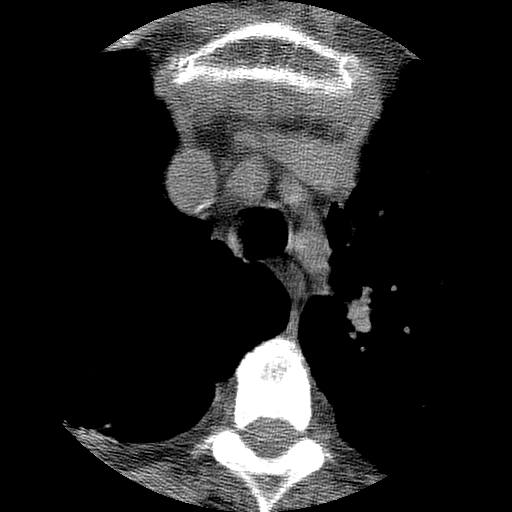
[im 1/63  bone]
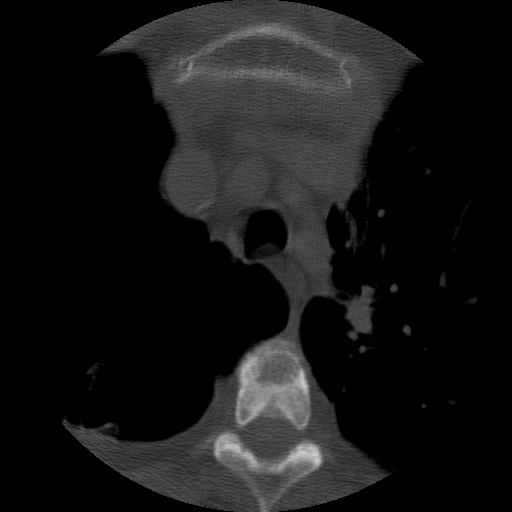
[im 32/63  bone]
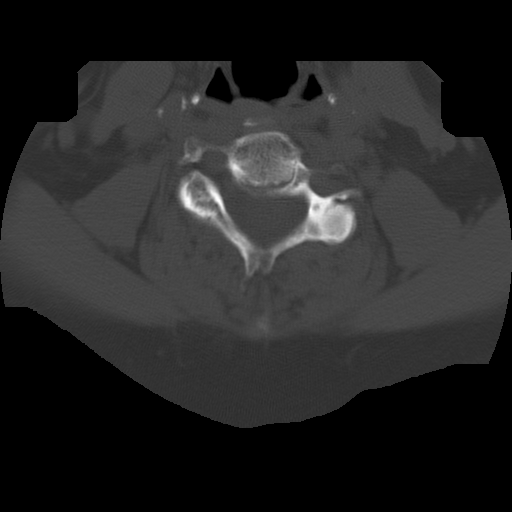
[im 63/63  bone]
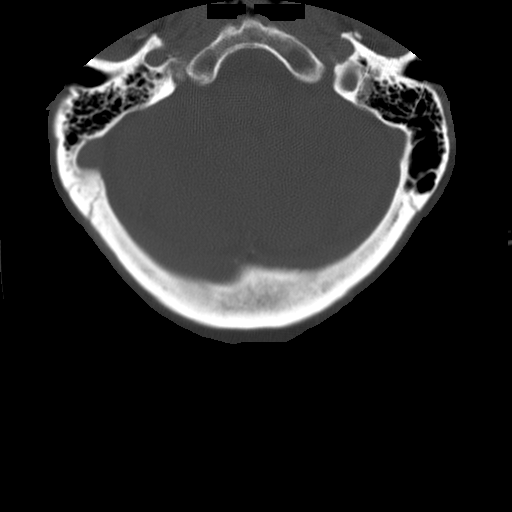

[3 of 14 positions shown; findings below may reference images not displayed]

FINDINGS: No focal scalp hematoma identified.  No acute orbit soft
tissue findings seen.  Minimal left ethmoid air cell mucosal
thickening.  Calvarium intact with no suspicious osseous lesion.

Mild generalized cerebral volume loss since 2777.  No
ventriculomegaly.  Patchy confluent bilateral white matter
hypodensity is new. No midline shift, mass effect, or evidence of
mass lesion.  No acute intracranial hemorrhage identified.  No
evidence of cortically based acute infarction identified.
IMPRESSION: 1.  No acute traumatic injury identified to the head.
2.  Mild generalized cerebral volume loss and moderate nonspecific
white matter changes since [DATE].  Cervical spine findings are below.

CT CERVICAL SPINE
FINDINGS: Straightening of cervical lordosis. Visualized skull base
is intact.  No atlanto-occipital dissociation.  Trace
anterolisthesis of C6 on C7. Widespread severe cervical facet
hypertrophy and degeneration. Bilateral posterior element alignment
is within normal limits.  Trace anterolisthesis of C7 on T1. No
acute cervical fracture identified.

Chronic heterogeneous sclerosis and collapse of the T2 vertebra is
stable from the recent comparison.  T3 heterogeneity is partially
visible.

Severe bullous emphysema in the right apex is stable.  Chronic
scarring consolidation and medial left apex appears stable.  Stable
calcified right thyroid nodule.  Other Visualized paraspinal soft
tissues are within normal limits.
IMPRESSION: 1. No acute fracture identified in the cervical spine.  Ligamentous
injury is not excluded.

2.  Advance cervical facet degeneration.
3.  Stable pathologic appearing compression fracture of T2.

## 2013-06-06 ENCOUNTER — Other Ambulatory Visit: Payer: Self-pay | Admitting: Internal Medicine

## 2013-06-06 ENCOUNTER — Ambulatory Visit
Admission: RE | Admit: 2013-06-06 | Discharge: 2013-06-06 | Disposition: A | Discharge status: Home / Self Care | Payer: Medicare Other | Source: Ambulatory Visit | Attending: Internal Medicine | Admitting: Internal Medicine

## 2013-06-06 DIAGNOSIS — R928 Other abnormal and inconclusive findings on diagnostic imaging of breast: Secondary | ICD-10-CM

## 2013-06-12 ENCOUNTER — Ambulatory Visit
Admission: RE | Admit: 2013-06-12 | Discharge: 2013-06-12 | Disposition: A | Discharge status: Home / Self Care | Payer: Medicare Other | Source: Ambulatory Visit | Attending: Internal Medicine | Admitting: Internal Medicine

## 2013-06-12 ENCOUNTER — Other Ambulatory Visit: Payer: Self-pay | Admitting: Internal Medicine

## 2013-06-12 DIAGNOSIS — R928 Other abnormal and inconclusive findings on diagnostic imaging of breast: Secondary | ICD-10-CM

## 2013-06-24 ENCOUNTER — Telehealth: Payer: Self-pay | Admitting: Internal Medicine

## 2013-06-24 NOTE — Telephone Encounter (Signed)
lmomtcb x1 for pt 

## 2013-06-25 ENCOUNTER — Ambulatory Visit (INDEPENDENT_AMBULATORY_CARE_PROVIDER_SITE_OTHER): Payer: Medicare Other

## 2013-06-25 DIAGNOSIS — J45909 Unspecified asthma, uncomplicated: Secondary | ICD-10-CM

## 2013-06-25 NOTE — Telephone Encounter (Signed)
Lmtcbx2 with pt spouse. Carron Curie, CMA

## 2013-06-26 DIAGNOSIS — J45909 Unspecified asthma, uncomplicated: Secondary | ICD-10-CM

## 2013-06-26 MED ORDER — OMALIZUMAB 150 MG ~~LOC~~ SOLR
225.0000 mg | Freq: Once | SUBCUTANEOUS | Status: AC
  Administered 2013-06-26: 225 mg via SUBCUTANEOUS

## 2013-06-26 MED ORDER — DOXYCYCLINE HYCLATE 100 MG PO TABS: ORAL_TABLET | ORAL | Status: DC

## 2013-06-26 NOTE — Telephone Encounter (Signed)
Ok doxycycline 100 mg, # 8, 2 today then one daily 

## 2013-06-26 NOTE — Telephone Encounter (Signed)
Called, spoke with pt.  C/o head congestion, prod cough with small amount of white mucus, wheezing, and increased SOB at rest and with exertion.  Symptoms started last week.  No sinus pressure, chest tightness, or f/c/s.  She is using duoneb more often (approx 4 x daily now) and is taking mucinex bid.  This is helping some.  Pt is requesting doxy rx.  Dr. Maple Hudson, pls advise.  Thank you.  Last OV with CY: 03/10/13  [facility]  Allergies  Allergen Reactions  . Clarithromycin     REACTION: nausea

## 2013-06-26 NOTE — Telephone Encounter (Signed)
Rx was sent to the [facility] with the pt's spouse and notified that this was done  He verbalized understanding and will inform the pt

## 2013-07-09 ENCOUNTER — Ambulatory Visit (INDEPENDENT_AMBULATORY_CARE_PROVIDER_SITE_OTHER): Payer: Medicare Other | Admitting: Pharmacist

## 2013-07-09 ENCOUNTER — Ambulatory Visit (INDEPENDENT_AMBULATORY_CARE_PROVIDER_SITE_OTHER): Payer: Medicare Other

## 2013-07-09 DIAGNOSIS — J45909 Unspecified asthma, uncomplicated: Secondary | ICD-10-CM

## 2013-07-09 DIAGNOSIS — J309 Allergic rhinitis, unspecified: Secondary | ICD-10-CM

## 2013-07-09 DIAGNOSIS — I4892 Unspecified atrial flutter: Secondary | ICD-10-CM

## 2013-07-09 LAB — POCT INR: INR: 2.3

## 2013-07-09 MED ORDER — OMALIZUMAB 150 MG ~~LOC~~ SOLR
225.0000 mg | Freq: Once | SUBCUTANEOUS | Status: AC
  Administered 2013-07-09: 225 mg via SUBCUTANEOUS

## 2013-07-17 ENCOUNTER — Other Ambulatory Visit: Payer: Self-pay | Admitting: Interventional Cardiology

## 2013-07-18 ENCOUNTER — Telehealth: Payer: Self-pay | Admitting: Internal Medicine

## 2013-07-18 MED ORDER — FLUTICASONE-SALMETEROL 250-50 MCG/DOSE IN AEPB: 1.0000 | INHALATION_SPRAY | Freq: Two times a day (BID) | RESPIRATORY_TRACT | Status: AC

## 2013-07-18 NOTE — Telephone Encounter (Signed)
RX has been sent.

## 2013-07-23 ENCOUNTER — Ambulatory Visit: Payer: Medicare Other

## 2013-07-24 ENCOUNTER — Other Ambulatory Visit: Payer: Self-pay | Admitting: Pulmonary Disease

## 2013-07-24 MED ORDER — PREDNISONE 5 MG PO TABS: 5.0000 mg | ORAL_TABLET | Freq: Every day | ORAL | Status: DC

## 2013-08-11 ENCOUNTER — Ambulatory Visit (INDEPENDENT_AMBULATORY_CARE_PROVIDER_SITE_OTHER): Payer: Medicare Other

## 2013-08-11 DIAGNOSIS — J309 Allergic rhinitis, unspecified: Secondary | ICD-10-CM

## 2013-08-11 DIAGNOSIS — J45909 Unspecified asthma, uncomplicated: Secondary | ICD-10-CM

## 2013-08-12 MED ORDER — OMALIZUMAB 150 MG ~~LOC~~ SOLR
225.0000 mg | Freq: Once | SUBCUTANEOUS | Status: AC
  Administered 2013-08-12: 225 mg via SUBCUTANEOUS

## 2013-08-25 ENCOUNTER — Ambulatory Visit: Payer: Medicare Other

## 2013-09-05 ENCOUNTER — Ambulatory Visit: Payer: Medicare Other

## 2013-09-09 ENCOUNTER — Ambulatory Visit: Payer: Medicare Other | Admitting: Internal Medicine

## 2013-09-19 ENCOUNTER — Ambulatory Visit (INDEPENDENT_AMBULATORY_CARE_PROVIDER_SITE_OTHER): Payer: Medicare Other

## 2013-09-19 DIAGNOSIS — J45909 Unspecified asthma, uncomplicated: Secondary | ICD-10-CM

## 2013-09-22 MED ORDER — OMALIZUMAB 150 MG ~~LOC~~ SOLR
225.0000 mg | Freq: Once | SUBCUTANEOUS | Status: AC
  Administered 2013-09-22: 225 mg via SUBCUTANEOUS

## 2013-09-26 ENCOUNTER — Ambulatory Visit: Payer: Medicare Other | Admitting: Internal Medicine

## 2013-10-07 ENCOUNTER — Ambulatory Visit: Payer: Medicare Other | Admitting: Internal Medicine

## 2013-10-10 ENCOUNTER — Other Ambulatory Visit: Payer: Self-pay | Admitting: Interventional Cardiology

## 2013-10-14 ENCOUNTER — Other Ambulatory Visit: Payer: Self-pay | Admitting: Internal Medicine

## 2013-10-14 MED ORDER — MONTELUKAST SODIUM 10 MG PO TABS: 10.0000 mg | ORAL_TABLET | Freq: Every day | ORAL | Status: AC

## 2013-10-14 MED ORDER — THEOPHYLLINE ER 300 MG PO TB12: 150.0000 mg | ORAL_TABLET | Freq: Two times a day (BID) | ORAL | Status: AC

## 2013-10-21 ENCOUNTER — Ambulatory Visit (INDEPENDENT_AMBULATORY_CARE_PROVIDER_SITE_OTHER): Payer: Medicare Other | Admitting: Pharmacist

## 2013-10-21 DIAGNOSIS — Z5181 Encounter for therapeutic drug level monitoring: Secondary | ICD-10-CM

## 2013-10-21 DIAGNOSIS — I4892 Unspecified atrial flutter: Secondary | ICD-10-CM

## 2013-10-21 LAB — POCT INR: INR: 2

## 2013-10-23 ENCOUNTER — Ambulatory Visit (INDEPENDENT_AMBULATORY_CARE_PROVIDER_SITE_OTHER): Payer: Medicare Other

## 2013-10-23 ENCOUNTER — Ambulatory Visit (INDEPENDENT_AMBULATORY_CARE_PROVIDER_SITE_OTHER): Payer: Medicare Other | Admitting: Internal Medicine

## 2013-10-23 ENCOUNTER — Ambulatory Visit: Payer: Medicare Other

## 2013-10-23 ENCOUNTER — Encounter: Payer: Self-pay | Admitting: Internal Medicine

## 2013-10-23 VITALS — BP 124/60 | HR 100 | Ht 65.0 in | Wt 146.4 lb

## 2013-10-23 DIAGNOSIS — J439 Emphysema, unspecified: Secondary | ICD-10-CM

## 2013-10-23 DIAGNOSIS — J45909 Unspecified asthma, uncomplicated: Secondary | ICD-10-CM

## 2013-10-23 DIAGNOSIS — J3089 Other allergic rhinitis: Secondary | ICD-10-CM

## 2013-10-23 DIAGNOSIS — J302 Other seasonal allergic rhinitis: Secondary | ICD-10-CM

## 2013-10-23 DIAGNOSIS — J984 Other disorders of lung: Secondary | ICD-10-CM

## 2013-10-23 DIAGNOSIS — J438 Other emphysema: Secondary | ICD-10-CM

## 2013-10-23 DIAGNOSIS — J449 Chronic obstructive pulmonary disease, unspecified: Secondary | ICD-10-CM

## 2013-10-23 DIAGNOSIS — J309 Allergic rhinitis, unspecified: Secondary | ICD-10-CM

## 2013-10-23 NOTE — Patient Instructions (Signed)
We can continue current treatment   Please call as needed

## 2013-10-23 NOTE — Progress Notes (Signed)
Patient ID: [name], female    DOB: Nov 08, 1935, 78 y.o.   MRN: [id number]  HPI 64/12- 32 yo former smoker with severe COPD/ chronic hypoxic respiratory failure, hx lung nodule treated with XRT as presumptive Ca w/o bx. OSA  Husband here. Last here November 28, 2010. Since then was hosp for tachyarrythmia- considered for ablation. She was concerned about risk of being put to sleep and wanted to discuss it here.  Now feels sore at strap level around chest, very congested and wheezy. Nebulizer helps but needing frequently. Malaise. Sputum white, no fever. Feet feel still a little swollen. Continues prednisone 5 mg every other day.   06/02/11- 78 yo former smoker with severe COPD/ chronic hypoxic respiratory failure, hx lung nodule treated with XRT as presumptive Ca w/o bx. OSA  Doing better since last visit. Less drainage and less cough. Breathing comfortable at rest on room air but not with any exertion. Theophylline level low 03/15/11- not likely to contribute to irregular heart beat.  Continues O2, low dose prednisone and Xolair.   10/03/11-  78 yo former smoker with severe COPD/ chronic hypoxic respiratory failure, hx lung nodule treated with XRT as presumptive Ca w/o bx, OSA Husband here. More increased SOB than usual and pain in back and chest area over past 2-3 days(recently had flu) Heart rate went up to 150s an hour or two after pain started. Had to double up on ditiazem due to increased heart palpitation-CP stopped once med taken. She reported this to her cardiology office, who referred her here. Today she feels back to normal, just tired.   Sleep Apnea -Wears CPAP for approx 6-8 hours at night.  11/10/11- 78 yo former smoker with severe COPD/ chronic hypoxic respiratory failure, hx lung nodule treated with XRT as presumptive Ca w/o bx, OSA Husband here. Acute visit- C/O: pain in upper back and around sides(pain moves); consistent pain but more intense with inhaling.    02/01/12-  78 yo  former smoker with severe COPD/ chronic hypoxic respiratory failure, hx lung nodule treated with XRT as presumptive Ca w/o bx, OSA    Husband here  Has to stop and rest more than usual; has turned O2 up to 3.5 and feels better In the past 2 or 3 weeks has had increased dyspnea on exertion with no abrupt change. Continues maintenance prednisone 5 mg daily. Uses nebulizer less than once per day. Some tachycardia palpitation and has a history of SVT. There has been some discussion of an ablation procedure. She continues allergy vaccine and feels that has helped her through the spring. CT 12/14/11 reviewed images with them. IMPRESSION:  Increasing paramediastinal nodularity along the medial left upper  lobe. While this may reflect fibrosis related to prior radiation,  given the history of XRT completion in 2011, the interval change is  worrisome. PET-CT is suggested for further evaluation.  Underlying moderate centrilobular and paraseptal emphysema with  stable bullous changes in the right lung apex.  These results will be called to the ordering clinician or  representative by the Radiologist Assistant, and communication  documented in the PACS Dashboard.  Original Report Authenticated By: Julian Hy, M.D.    05/09/12- 78 yo former smoker with severe COPD/ chronic hypoxic respiratory failure, hx lung nodule treated with XRT as presumptive Ca w/o bx, OSA ., Hx SVT    Recent acute illness: Slight fever, SOB, and cough. Has sinus flare up , with nausea, myalgias, nasal congestion and watery rhinorrhea. Sister had  similar illness. Still on prednisone 5 mg daily and oxygen 3.5 L/Advanced.  History of SVT and notices occasional tachycardia palpitation. Nodular prominence left upper lobe suspicious for tumor recurrence. She has followup chest CT and appointment pending with Dr. Tammi Klippel Rad.Onc. OSA/ CPAP doing well with no changes or concerns. PET 02/13/12- images reviewed with her IMPRESSION:   Continued progression of abnormal soft tissue/nodularity in the  medial left upper lung, now measuring approximately 2.6 x 1.5 cm,  mildly FDG avid with max SUV 4.4. This appearance is suspicious  for tumor recurrence.  No evidence of metastatic disease.  Original Report Authenticated By: Julian Hy, M.D.    09/09/12- 78 yo former smoker with severe COPD/ chronic hypoxic respiratory failure, hx lung nodule treated with XRT as presumptive Ca w/o bx, OSA ., Hx SVT FOLLOWS FOR: chest congestion(using Mucinex), wheezing, and more SOB than usual Ran out of Theodur for 5 days and just restarted a week ago. Began again increased chest congestion. Some dry throat clearing and wheeze but no fever or sore throat. Had fallen and bruised her chest. Continues prednisone 5 mg daily Had ablation for heart rhythm-successful. Xolair continues to help. CT chest 09/02/12 reviewed with her IMPRESSION:  1. 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  2. Interval sclerosis and pathologic fracture of the manubrium.  This may represent blastic bone metastasis. Alternatively these  findings may represent osteonecrosis secondary to post radiation  therapy.  3. Stable appearance of the compression fractures and abnormal  increase sclerosis involving the T2 and T3 vertebra. 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 Cannot rule  out sclerotic bone metastasis with associated pathologic fractures.  4. Stable T12 compression fracture  Original Report Authenticated By: Kerby Moors, M.D.   03/10/13- 78 yo former smoker with severe COPD/ chronic hypoxic respiratory failure, hx lung nodule treated with XRT as presumptive Ca w/o bx, OSA ., Hx SVT FOLLOWS FOR: cough-productive started out yellow but now clear in color since last week-was given pred taper. SOB and wheezing-using nebulizer at home  around the clock. chest tightness and congestion. Fever last week as well. She had a cold last week and we called in prednisone taper. Slowly better. Fever gone. CT chest 03/06/13 IMPRESSION:  1. Stable left apical scarring/radiation changes. No CT findings  to suggest residual, recurrent or metastatic disease.  2. No mediastinal or hilar lymphadenopathy.  3. Stable advanced emphysematous changes.  4. Stable sclerotic bone lesions involving the manubrium, T2 and  T3 vertebral bodies.  5. Stable T12 compression fracture through  Original Report Authenticated By: Marijo Sanes, M.D.  10/23/13- 30 yo former smoker with severe COPD/ chronic hypoxic respiratory failure, hx lung nodule treated with XRT as presumptive Ca w/o bx, OSA ., Hx SVT FOLLOWS FOR:  as long as she is wearing her O2 and pacing herself she is doing well; continues to get Xolair and allergy injections. O2 3L/ concentrator Imogene Little phlegm  Review of Systems- see HPI Constitutional:   No weight loss, night sweats, fevers, chills, fatigue, lassitude. HEENT:   No headaches,  Difficulty swallowing,  Tooth/dental problems,  Sore throat,                No sneezing, itching, ear ache, +nasal congestion, +post nasal drip,  CV:  No acute chest pain,  No-orthopnea, PND, swelling in lower extremities, anasarca, dizziness, palpitations GI  No heartburn, + indigestion,  No-abdominal pain, nausea, vomiting,  Resp:no-he excess mucus, No coughing up of blood.  No change in color of mucus.  Skin: no rash or lesions. GU:  MS:  No joint pain or swelling. Marland Kitchen Psych:  No change in mood or affect. No depression or anxiety.  No memory loss.    Objective:   Physical Exam  General- Alert, Oriented, Affect-appropriate, Distress- none acute   O2 on 3.5 L/M Skin- rash-none, lesions- none, excoriation- none Lymphadenopathy- none Head- atraumatic            Eyes- Gross vision intact, PERRLA, conjunctivae clear secretions            Ears-  Hearing, canals            Nose- + mucoid bridging, Septal dev,  polyps, erosion, perforation. Chronic nasal quality to speech.              Throat- Mallampati II , mucosa clear , drainage- none, tonsils- atrophic Neck- flexible , trachea midline, no stridor , thyroid nl, carotid no bruit Chest - symmetrical excursion , unlabored           Heart/CV- RRR , no murmur , no gallop  , no rub, nl s1 s2                           - JVD- none , edema- none, stasis changes- none, varices- none           Lung- + raspy cough with deep breath, +decreased sounds  , dullness-none, rub- none, unlabored           Chest wall-  Abd-  Br/ Gen/ Rectal- Not done, not indicated Extrem- cyanosis- none, clubbing, none, atrophy- none, strength- nl Neuro- grossly intact to observation

## 2013-10-24 MED ORDER — OMALIZUMAB 150 MG ~~LOC~~ SOLR
225.0000 mg | Freq: Once | SUBCUTANEOUS | Status: AC
  Administered 2013-10-24: 225 mg via SUBCUTANEOUS

## 2013-11-06 ENCOUNTER — Ambulatory Visit: Payer: Medicare Other

## 2013-11-10 ENCOUNTER — Ambulatory Visit: Payer: Medicare Other

## 2013-11-11 ENCOUNTER — Ambulatory Visit: Payer: Medicare Other

## 2013-11-14 ENCOUNTER — Ambulatory Visit (INDEPENDENT_AMBULATORY_CARE_PROVIDER_SITE_OTHER): Payer: Medicare Other

## 2013-11-14 DIAGNOSIS — J45909 Unspecified asthma, uncomplicated: Secondary | ICD-10-CM

## 2013-11-15 NOTE — Assessment & Plan Note (Signed)
She is wanted to continue allergy vaccine rather than risk changes that might upset any instability she can have.

## 2013-11-15 NOTE — Assessment & Plan Note (Signed)
Stable scarring after radiation therapy

## 2013-11-15 NOTE — Assessment & Plan Note (Signed)
She has gotten through the winter well so far and continues on with supportive family. No changes indicated.

## 2013-11-15 NOTE — Assessment & Plan Note (Signed)
She has felt more stable with Xolair

## 2013-11-18 MED ORDER — OMALIZUMAB 150 MG ~~LOC~~ SOLR
225.0000 mg | Freq: Once | SUBCUTANEOUS | Status: AC
  Administered 2013-11-18: 225 mg via SUBCUTANEOUS

## 2013-11-28 ENCOUNTER — Encounter: Payer: Self-pay | Admitting: Interventional Cardiology

## 2013-12-02 ENCOUNTER — Ambulatory Visit: Payer: Medicare Other | Admitting: Interventional Cardiology

## 2013-12-12 ENCOUNTER — Encounter: Payer: Self-pay | Admitting: Internal Medicine

## 2013-12-12 ENCOUNTER — Ambulatory Visit (INDEPENDENT_AMBULATORY_CARE_PROVIDER_SITE_OTHER): Payer: Medicare Other | Admitting: *Deleted

## 2013-12-12 ENCOUNTER — Ambulatory Visit: Payer: Medicare Other

## 2013-12-12 DIAGNOSIS — Z5181 Encounter for therapeutic drug level monitoring: Secondary | ICD-10-CM

## 2013-12-12 DIAGNOSIS — I4892 Unspecified atrial flutter: Secondary | ICD-10-CM

## 2013-12-12 LAB — POCT INR: INR: 1.9

## 2013-12-15 ENCOUNTER — Other Ambulatory Visit (HOSPITAL_COMMUNITY): Payer: Self-pay | Admitting: Interventional Cardiology

## 2013-12-15 ENCOUNTER — Ambulatory Visit (INDEPENDENT_AMBULATORY_CARE_PROVIDER_SITE_OTHER): Payer: Medicare Other

## 2013-12-15 ENCOUNTER — Encounter (INDEPENDENT_AMBULATORY_CARE_PROVIDER_SITE_OTHER): Payer: Self-pay

## 2013-12-15 ENCOUNTER — Telehealth: Payer: Self-pay | Admitting: Internal Medicine

## 2013-12-15 DIAGNOSIS — J45909 Unspecified asthma, uncomplicated: Secondary | ICD-10-CM

## 2013-12-15 NOTE — Telephone Encounter (Signed)
ATC NA on home number  LMTCB on her cell number

## 2013-12-15 NOTE — Telephone Encounter (Signed)
Spoke with [name]. C/o prod cough w/ whote phlem, weak, off balance, nasal congestion, dizziness, increase SOB activity/rest, wheezing, chest tx. Getting worse x 2 weeks. Has been taking tylenol. She is coming in at 3:30 for xolair. Requesting further recs. Please advise Dr. Annamaria Boots thanks  Allergies  Allergen Reactions  . Clarithromycin     REACTION: nausea    Current Outpatient Prescriptions on File Prior to Visit  Medication Sig Dispense Refill  . albuterol (PROVENTIL HFA;VENTOLIN HFA) 108 (90 BASE) MCG/ACT inhaler Inhale 2 puffs into the lungs every 6 (six) hours as needed. For shortness of breath      . buPROPion (WELLBUTRIN SR) 150 MG 12 hr tablet Take 150 mg by mouth 2 (two) times daily.       Marland Kitchen diltiazem (CARDIZEM CD) 240 MG 24 hr capsule take 1 capsule by mouth once daily  30 capsule  6  . EPINEPHrine (EPIPEN) 0.3 mg/0.3 mL DEVI Inject 0.3 mg into the muscle as needed. For allergic reactions      . flecainide (TAMBOCOR) 50 MG tablet Take 50 mg by mouth 2 (two) times daily.       . Fluticasone-Salmeterol (ADVAIR) 250-50 MCG/DOSE AEPB Inhale 1 puff into the lungs every 12 (twelve) hours.  60 each  6  . guaiFENesin (MUCINEX) 600 MG 12 hr tablet Take 600 mg by mouth every 12 (twelve) hours as needed. For congestion      . HYDROcodone-homatropine (HYCODAN) 5-1.5 MG/5ML syrup Take 5 mLs by mouth every 6 (six) hours as needed for cough.  200 mL  0  . ipratropium-albuterol (DUONEB) 0.5-2.5 (3) MG/3ML SOLN Take 3 mLs by nebulization every 6 (six) hours as needed. For shortness of breath  360 mL  11  . levothyroxine (SYNTHROID, LEVOTHROID) 75 MCG tablet Take 75 mcg by mouth daily.      . montelukast (SINGULAIR) 10 MG tablet Take 1 tablet (10 mg total) by mouth at bedtime.  30 tablet  5  . NON FORMULARY CPAP       . NON FORMULARY Oxygen 2-4 LPM      . omalizumab (XOLAIR) 150 MG injection Inject 225 mg into the skin every 14 (fourteen) days. Friday      . predniSONE (DELTASONE) 5 MG tablet Take 1  tablet (5 mg total) by mouth daily.  90 tablet  1  . risedronate (ACTONEL) 35 MG tablet Take 35 mg by mouth every 7 (seven) days. with water on empty stomach, nothing by mouth or lie down for next 30 minutes; takes on Saturdays      . theophylline (THEODUR) 300 MG 12 hr tablet Take 0.5 tablets (150 mg total) by mouth 2 (two) times daily.  30 tablet  5  . tiotropium (SPIRIVA) 18 MCG inhalation capsule Place 18 mcg into inhaler and inhale daily.      . traMADol (ULTRAM) 50 MG tablet Take 1 tablet by mouth 4 (four) times daily as needed (pain).       Marland Kitchen venlafaxine (EFFEXOR) 37.5 MG tablet 37.5 mg. Take one time daily      . warfarin (COUMADIN) 1 MG tablet Take 2 tablets daily or as directed by [facility].  60 tablet  4  . zolpidem (AMBIEN) 5 MG tablet Take 2.5 mg by mouth at bedtime as needed. For sleep       No current facility-administered medications on file prior to visit.

## 2013-12-15 NOTE — Telephone Encounter (Signed)
Offer prednisone 10 mg, # 20, 4 X 2 DAYS, 3 X 2 DAYS, 2 X 2 DAYS, 1 X 2 DAYS  

## 2013-12-16 MED ORDER — PREDNISONE 10 MG PO TABS: ORAL_TABLET | ORAL | Status: DC

## 2013-12-16 NOTE — Telephone Encounter (Signed)
Pt aware of recs. rx called in

## 2013-12-17 ENCOUNTER — Other Ambulatory Visit (HOSPITAL_COMMUNITY): Payer: Self-pay | Admitting: Interventional Cardiology

## 2013-12-17 MED ORDER — OMALIZUMAB 150 MG ~~LOC~~ SOLR
225.0000 mg | Freq: Once | SUBCUTANEOUS | Status: AC
  Administered 2013-12-17: 225 mg via SUBCUTANEOUS

## 2013-12-29 ENCOUNTER — Ambulatory Visit: Payer: Medicare Other

## 2014-01-12 ENCOUNTER — Ambulatory Visit (INDEPENDENT_AMBULATORY_CARE_PROVIDER_SITE_OTHER): Payer: Medicare Other

## 2014-01-12 DIAGNOSIS — J45909 Unspecified asthma, uncomplicated: Secondary | ICD-10-CM

## 2014-01-14 MED ORDER — OMALIZUMAB 150 MG ~~LOC~~ SOLR
225.0000 mg | Freq: Once | SUBCUTANEOUS | Status: AC
Start: 2014-01-14 — End: 2014-01-14
  Administered 2014-01-14: 225 mg via SUBCUTANEOUS

## 2014-01-20 ENCOUNTER — Telehealth: Payer: Self-pay | Admitting: Radiation Oncology

## 2014-01-20 NOTE — Telephone Encounter (Signed)
Phoned patient's home. No answer. Left message requesting return call. Wanted to inform patient that Dr. Tammi Klippel wishes to leave CT as scheduled.

## 2014-01-20 NOTE — Telephone Encounter (Signed)
Message copied by Heywood Footman on Tue Jan 20, 2014 12:47 PM ------      Message from: [place], [place]      Created: Tue Jan 20, 2014 12:25 PM      Regarding: RE: Should I move CT closer to follow up       Not sure how this got moved to 7/6.  I will be in [place] then, and should be marked OFF for vacation.  I would prefer to keep her on 6/25.  When my PUTs get moved to another day, I think it is probably OK to leave a few follow-ups in place, but, maybe space them out one per hour?            MM                  ----- Message -----         From: Heywood Footman, RN         Sent: 01/20/2014  11:34 AM           To: Lora Paula, MD      Subject: Should I move CT closer to follow up                     Dr. Tammi Klippel.             This patient's follow up was moved from 03/12/14 to 03/23/14 to review 03/06/2014 CT of chest. Should I leave her CT on 03/06/2014 then, call her with results to save her a follow up trip or move the CT closer to 03/23/14 so she doesn't have to wait so long for results.            Sam, RN       ------

## 2014-01-21 ENCOUNTER — Telehealth: Payer: Self-pay | Admitting: Internal Medicine

## 2014-01-21 ENCOUNTER — Ambulatory Visit: Payer: Medicare Other | Admitting: Interventional Cardiology

## 2014-01-21 MED ORDER — PREDNISONE 5 MG PO TABS: 5.0000 mg | ORAL_TABLET | Freq: Every day | ORAL | Status: DC

## 2014-01-21 MED ORDER — PREDNISONE 10 MG PO TABS: ORAL_TABLET | ORAL | Status: DC

## 2014-01-21 NOTE — Telephone Encounter (Signed)
Yes, ok to fill prednisone taper.

## 2014-01-21 NOTE — Telephone Encounter (Signed)
Pt current with OV's; I have sent Maintenance Pred 5 mg #90 to pharmacy electronically; however I am sending refill request for Pred 10 mg 8 day taper to [facility] to approve.   [facility], are you okay with refilling Pred 10 mg taper dose for patient. Thanks.

## 2014-01-21 NOTE — Telephone Encounter (Signed)
I have sent Pred 10 mg taper to drugstore as well.

## 2014-01-23 ENCOUNTER — Ambulatory Visit (INDEPENDENT_AMBULATORY_CARE_PROVIDER_SITE_OTHER): Payer: Medicare Other | Admitting: Internal Medicine

## 2014-01-23 ENCOUNTER — Ambulatory Visit: Payer: Medicare Other

## 2014-01-23 ENCOUNTER — Encounter: Payer: Self-pay | Admitting: Internal Medicine

## 2014-01-23 ENCOUNTER — Telehealth: Payer: Self-pay | Admitting: Internal Medicine

## 2014-01-23 VITALS — BP 130/60 | HR 94 | Ht 65.0 in | Wt 145.4 lb

## 2014-01-23 DIAGNOSIS — J3089 Other allergic rhinitis: Secondary | ICD-10-CM

## 2014-01-23 DIAGNOSIS — J441 Chronic obstructive pulmonary disease with (acute) exacerbation: Secondary | ICD-10-CM

## 2014-01-23 DIAGNOSIS — C3490 Malignant neoplasm of unspecified part of unspecified bronchus or lung: Secondary | ICD-10-CM

## 2014-01-23 DIAGNOSIS — J302 Other seasonal allergic rhinitis: Secondary | ICD-10-CM

## 2014-01-23 DIAGNOSIS — J438 Other emphysema: Secondary | ICD-10-CM

## 2014-01-23 DIAGNOSIS — J439 Emphysema, unspecified: Secondary | ICD-10-CM

## 2014-01-23 DIAGNOSIS — G4733 Obstructive sleep apnea (adult) (pediatric): Secondary | ICD-10-CM

## 2014-01-23 DIAGNOSIS — J309 Allergic rhinitis, unspecified: Secondary | ICD-10-CM

## 2014-01-23 MED ORDER — HYDROCODONE-HOMATROPINE 5-1.5 MG/5ML PO SYRP: 5.0000 mL | ORAL_SOLUTION | Freq: Four times a day (QID) | ORAL | Status: DC | PRN

## 2014-01-23 MED ORDER — PREDNISONE 10 MG PO TABS
ORAL_TABLET | ORAL | Status: DC
Start: 2014-01-23 — End: 2014-02-09

## 2014-01-23 MED ORDER — AMOXICILLIN-POT CLAVULANATE 500-125 MG PO TABS: ORAL_TABLET | ORAL | Status: DC

## 2014-01-23 MED ORDER — LEVALBUTEROL HCL 0.63 MG/3ML IN NEBU: 0.6300 mg | INHALATION_SOLUTION | Freq: Once | RESPIRATORY_TRACT | Status: DC

## 2014-01-23 MED ORDER — METHYLPREDNISOLONE ACETATE 80 MG/ML IJ SUSP
80.0000 mg | Freq: Once | INTRAMUSCULAR | Status: AC
  Administered 2014-01-23: 80 mg via INTRAMUSCULAR

## 2014-01-23 NOTE — Progress Notes (Signed)
Patient ID: [name], female    DOB: [birth year], 78 y.o.   MRN: [id number]  HPI 72/12- 10 yo former smoker with severe COPD/ chronic hypoxic respiratory failure, hx lung nodule treated with XRT as presumptive Ca w/o bx. OSA  Husband here. Last here November 28, 2010. Since then was hosp for tachyarrythmia- considered for ablation. She was concerned about risk of being put to sleep and wanted to discuss it here.  Now feels sore at strap level around chest, very congested and wheezy. Nebulizer helps but needing frequently. Malaise. Sputum white, no fever. Feet feel still a little swollen. Continues prednisone 5 mg every other day.   06/02/11- 38 yo former smoker with severe COPD/ chronic hypoxic respiratory failure, hx lung nodule treated with XRT as presumptive Ca w/o bx. OSA  Doing better since last visit. Less drainage and less cough. Breathing comfortable at rest on room air but not with any exertion. Theophylline level low 03/15/11- not likely to contribute to irregular heart beat.  Continues O2, low dose prednisone and Xolair.   10/03/11-  70 yo former smoker with severe COPD/ chronic hypoxic respiratory failure, hx lung nodule treated with XRT as presumptive Ca w/o bx, OSA Husband here. More increased SOB than usual and pain in back and chest area over past 2-3 days(recently had flu) Heart rate went up to 150s an hour or two after pain started. Had to double up on ditiazem due to increased heart palpitation-CP stopped once med taken. She reported this to her cardiology office, who referred her here. Today she feels back to normal, just tired.   Sleep Apnea -Wears CPAP for approx 6-8 hours at night.  11/10/11- 47 yo former smoker with severe COPD/ chronic hypoxic respiratory failure, hx lung nodule treated with XRT as presumptive Ca w/o bx, OSA Husband here. Acute visit- C/O: pain in upper back and around sides(pain moves); consistent pain but more intense with inhaling.    02/01/12-  22 yo  former smoker with severe COPD/ chronic hypoxic respiratory failure, hx lung nodule treated with XRT as presumptive Ca w/o bx, OSA    Husband here  Has to stop and rest more than usual; has turned O2 up to 3.5 and feels better In the past 2 or 3 weeks has had increased dyspnea on exertion with no abrupt change. Continues maintenance prednisone 5 mg daily. Uses nebulizer less than once per day. Some tachycardia palpitation and has a history of SVT. There has been some discussion of an ablation procedure. She continues allergy vaccine and feels that has helped her through the spring. CT 12/14/11 reviewed images with them. IMPRESSION:  Increasing paramediastinal nodularity along the medial left upper  lobe. While this may reflect fibrosis related to prior radiation,  given the history of XRT completion in 2011, the interval change is  worrisome. PET-CT is suggested for further evaluation.  Underlying moderate centrilobular and paraseptal emphysema with  stable bullous changes in the right lung apex.  These results will be called to the ordering clinician or  representative by the Radiologist Assistant, and communication  documented in the PACS Dashboard.  Original Report Authenticated By: Julian Hy, M.D.    05/09/12- 44 yo former smoker with severe COPD/ chronic hypoxic respiratory failure, hx lung nodule treated with XRT as presumptive Ca w/o bx, OSA ., Hx SVT    Recent acute illness: Slight fever, SOB, and cough. Has sinus flare up , with nausea, myalgias, nasal congestion and watery rhinorrhea. Sister had  similar illness. Still on prednisone 5 mg daily and oxygen 3.5 L/Advanced.  History of SVT and notices occasional tachycardia palpitation. Nodular prominence left upper lobe suspicious for tumor recurrence. She has followup chest CT and appointment pending with Dr. Tammi Klippel Rad.Onc. OSA/ CPAP doing well with no changes or concerns. PET 02/13/12- images reviewed with her IMPRESSION:   Continued progression of abnormal soft tissue/nodularity in the  medial left upper lung, now measuring approximately 2.6 x 1.5 cm,  mildly FDG avid with max SUV 4.4. This appearance is suspicious  for tumor recurrence.  No evidence of metastatic disease.  Original Report Authenticated By: Julian Hy, M.D.    09/09/12- 66 yo former smoker with severe COPD/ chronic hypoxic respiratory failure, hx lung nodule treated with XRT as presumptive Ca w/o bx, OSA ., Hx SVT FOLLOWS FOR: chest congestion(using Mucinex), wheezing, and more SOB than usual Ran out of Theodur for 5 days and just restarted a week ago. Began again increased chest congestion. Some dry throat clearing and wheeze but no fever or sore throat. Had fallen and bruised her chest. Continues prednisone 5 mg daily Had ablation for heart rhythm-successful. Xolair continues to help. CT chest 09/02/12 reviewed with her IMPRESSION:  1. 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  2. Interval sclerosis and pathologic fracture of the manubrium.  This may represent blastic bone metastasis. Alternatively these  findings may represent osteonecrosis secondary to post radiation  therapy.  3. Stable appearance of the compression fractures and abnormal  increase sclerosis involving the T2 and T3 vertebra. 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 Cannot rule  out sclerotic bone metastasis with associated pathologic fractures.  4. Stable T12 compression fracture  Original Report Authenticated By: Kerby Moors, M.D.   03/10/13- 89 yo former smoker with severe COPD/ chronic hypoxic respiratory failure, hx lung nodule treated with XRT as presumptive Ca w/o bx, OSA ., Hx SVT FOLLOWS FOR: cough-productive started out yellow but now clear in color since last week-was given pred taper. SOB and wheezing-using nebulizer at home  around the clock. chest tightness and congestion. Fever last week as well. She had a cold last week and we called in prednisone taper. Slowly better. Fever gone. CT chest 03/06/13 IMPRESSION:  1. Stable left apical scarring/radiation changes. No CT findings  to suggest residual, recurrent or metastatic disease.  2. No mediastinal or hilar lymphadenopathy.  3. Stable advanced emphysematous changes.  4. Stable sclerotic bone lesions involving the manubrium, T2 and  T3 vertebral bodies.  5. Stable T12 compression fracture through  Original Report Authenticated By: Marijo Sanes, M.D.  10/23/13- 80 yo former smoker with severe COPD/ chronic hypoxic respiratory failure, hx lung nodule treated with XRT as presumptive Ca w/o bx, OSA ., Hx SVT FOLLOWS FOR:  as long as she is wearing her O2 and pacing herself she is doing well; continues to get Xolair and allergy injections. O2 3L/ concentrator Imogene Little phlegm  01/23/14- 62 yo former smoker with severe COPD/ chronic hypoxic respiratory failure, hx lung nodule treated with XRT as presumptive Ca w/o bx, OSA ., Hx SVT Acute OV for cough with green sputum for 2 days now thick white sputum, increase SOB, unbalanced with her walking, wheezing.   We sent pred taper recently. Continues 5 mg prednisone daily maintenance. She has been treating a cold back and forth with her husband. She again says that Xolair helps  her and is worth continuing. Currently denies fever or sore throat but feels weak. Sputum is no longer green but still thick with increased cough. Mucinex helps.  Review of Systems- see HPI Constitutional:   No weight loss, night sweats, fevers, chills, fatigue, lassitude. HEENT:   No headaches,  Difficulty swallowing,  Tooth/dental problems,  Sore throat,                No sneezing, itching, ear ache, +nasal congestion, +post nasal drip,  CV:  No acute chest pain,  No-orthopnea, PND, swelling in lower extremities, anasarca, dizziness,                palpitations GI  No heartburn, + indigestion,  No-abdominal pain, nausea, vomiting,  Resp:     no- excess mucus, No coughing up of blood.  No change in color of mucus.  Skin: no rash or lesions. GU:  MS:  No joint pain or swelling. Marland Kitchen Psych:  No change in mood or affect. No depression or anxiety.  No memory loss.    Objective:   Physical Exam  General- Alert, Oriented, Affect-appropriate, Distress- none acute   O2 on 3.5 L/M Skin- rash-none, lesions- none, excoriation- none Lymphadenopathy- none Head- atraumatic            Eyes- Gross vision intact, PERRLA, conjunctivae clear secretions            Ears- Hearing, canals            Nose- + mucoid bridging, Septal dev,  polyps, erosion, perforation. Chronic nasal quality to speech.              Throat- Mallampati II , mucosa clear , drainage- none, tonsils- atrophic Neck- flexible , trachea midline, no stridor , thyroid nl, carotid no bruit Chest - symmetrical excursion , unlabored           Heart/CV- RRR , no murmur , no gallop  , no rub, nl s1 s2                           - JVD- none , edema- none, stasis changes- none, varices- none           Lung- + raspy cough with deep breath, wheeze +, +decreased sounds  , dullness-none,                     rub- none, unlabored           Chest wall-  Abd-  Br/ Gen/ Rectal- Not done, not indicated Extrem- cyanosis- none, clubbing, none, atrophy- none, strength- nl, +wheelchair Neuro- grossly intact to observation

## 2014-01-23 NOTE — Telephone Encounter (Signed)
Pt c/o increased cough, SOB, wheezing, chest congestion, sinus issues, leg/body weakness x 2 weeks. Patient reports being feverish x 2 days ago--f/c/s Pt states that she was coughing up green mucus but it is now white. Pt offered appt with CDY today---pt coming in a 3:45 to be seen.

## 2014-01-23 NOTE — Patient Instructions (Addendum)
Scripts sent for prednisone burst and augmentin antibiotic. When you finish the taper, go back to prednisone 5 mg daily  Neb xop 0.63  Depo 80  Keep appointment August 10 unless needed sooner  Refill Hycodan printed

## 2014-01-24 ENCOUNTER — Encounter: Payer: Self-pay | Admitting: Internal Medicine

## 2014-01-24 NOTE — Assessment & Plan Note (Addendum)
No recurrence after XRT

## 2014-01-24 NOTE — Assessment & Plan Note (Signed)
Remains compliant with CPAP and oxygen

## 2014-01-24 NOTE — Assessment & Plan Note (Signed)
Acute exacerbation Plan-repeat prednisone taper back to 5 mg maintenance daily, Augmentin, nebulizer Xopenex, Depo-Medrol

## 2014-01-27 ENCOUNTER — Ambulatory Visit (INDEPENDENT_AMBULATORY_CARE_PROVIDER_SITE_OTHER): Payer: Medicare Other

## 2014-01-27 DIAGNOSIS — J309 Allergic rhinitis, unspecified: Secondary | ICD-10-CM

## 2014-02-02 ENCOUNTER — Inpatient Hospital Stay (HOSPITAL_COMMUNITY)
Admission: EM | Admit: 2014-02-02 | Discharge: 2014-02-10 | DRG: 871 | Disposition: A | Discharge status: Skilled Nursing Facility | Payer: Medicare Other | Attending: Internal Medicine | Admitting: Internal Medicine

## 2014-02-02 ENCOUNTER — Encounter (HOSPITAL_COMMUNITY): Payer: Self-pay | Admitting: Emergency Medicine

## 2014-02-02 ENCOUNTER — Emergency Department (HOSPITAL_COMMUNITY): Payer: Medicare Other

## 2014-02-02 DIAGNOSIS — A419 Sepsis, unspecified organism: Principal | ICD-10-CM

## 2014-02-02 DIAGNOSIS — E872 Acidosis, unspecified: Secondary | ICD-10-CM | POA: Diagnosis present

## 2014-02-02 DIAGNOSIS — J9600 Acute respiratory failure, unspecified whether with hypoxia or hypercapnia: Secondary | ICD-10-CM

## 2014-02-02 DIAGNOSIS — N390 Urinary tract infection, site not specified: Secondary | ICD-10-CM | POA: Diagnosis present

## 2014-02-02 DIAGNOSIS — S22000A Wedge compression fracture of unspecified thoracic vertebra, initial encounter for closed fracture: Secondary | ICD-10-CM | POA: Diagnosis present

## 2014-02-02 DIAGNOSIS — J441 Chronic obstructive pulmonary disease with (acute) exacerbation: Secondary | ICD-10-CM

## 2014-02-02 DIAGNOSIS — N189 Chronic kidney disease, unspecified: Secondary | ICD-10-CM | POA: Diagnosis present

## 2014-02-02 DIAGNOSIS — M810 Age-related osteoporosis without current pathological fracture: Secondary | ICD-10-CM | POA: Diagnosis present

## 2014-02-02 DIAGNOSIS — D649 Anemia, unspecified: Secondary | ICD-10-CM | POA: Diagnosis present

## 2014-02-02 DIAGNOSIS — W19XXXA Unspecified fall, initial encounter: Secondary | ICD-10-CM | POA: Diagnosis present

## 2014-02-02 DIAGNOSIS — S32592A Other specified fracture of left pubis, initial encounter for closed fracture: Secondary | ICD-10-CM | POA: Diagnosis present

## 2014-02-02 DIAGNOSIS — R778 Other specified abnormalities of plasma proteins: Secondary | ICD-10-CM

## 2014-02-02 DIAGNOSIS — R0609 Other forms of dyspnea: Secondary | ICD-10-CM

## 2014-02-02 DIAGNOSIS — Z923 Personal history of irradiation: Secondary | ICD-10-CM

## 2014-02-02 DIAGNOSIS — R0603 Acute respiratory distress: Secondary | ICD-10-CM

## 2014-02-02 DIAGNOSIS — J449 Chronic obstructive pulmonary disease, unspecified: Secondary | ICD-10-CM

## 2014-02-02 DIAGNOSIS — Z79899 Other long term (current) drug therapy: Secondary | ICD-10-CM

## 2014-02-02 DIAGNOSIS — Z66 Do not resuscitate: Secondary | ICD-10-CM | POA: Diagnosis present

## 2014-02-02 DIAGNOSIS — R0989 Other specified symptoms and signs involving the circulatory and respiratory systems: Secondary | ICD-10-CM

## 2014-02-02 DIAGNOSIS — G4733 Obstructive sleep apnea (adult) (pediatric): Secondary | ICD-10-CM | POA: Diagnosis present

## 2014-02-02 DIAGNOSIS — Z9181 History of falling: Secondary | ICD-10-CM

## 2014-02-02 DIAGNOSIS — J9602 Acute respiratory failure with hypercapnia: Secondary | ICD-10-CM

## 2014-02-02 DIAGNOSIS — S3210XA Unspecified fracture of sacrum, initial encounter for closed fracture: Secondary | ICD-10-CM | POA: Diagnosis present

## 2014-02-02 DIAGNOSIS — R799 Abnormal finding of blood chemistry, unspecified: Secondary | ICD-10-CM

## 2014-02-02 DIAGNOSIS — R55 Syncope and collapse: Secondary | ICD-10-CM

## 2014-02-02 DIAGNOSIS — R7989 Other specified abnormal findings of blood chemistry: Secondary | ICD-10-CM | POA: Insufficient documentation

## 2014-02-02 DIAGNOSIS — J189 Pneumonia, unspecified organism: Secondary | ICD-10-CM

## 2014-02-02 DIAGNOSIS — R5381 Other malaise: Secondary | ICD-10-CM | POA: Diagnosis present

## 2014-02-02 DIAGNOSIS — R7309 Other abnormal glucose: Secondary | ICD-10-CM | POA: Diagnosis present

## 2014-02-02 DIAGNOSIS — R6889 Other general symptoms and signs: Secondary | ICD-10-CM | POA: Diagnosis not present

## 2014-02-02 DIAGNOSIS — J45901 Unspecified asthma with (acute) exacerbation: Secondary | ICD-10-CM

## 2014-02-02 DIAGNOSIS — S32591A Other specified fracture of right pubis, initial encounter for closed fracture: Secondary | ICD-10-CM | POA: Diagnosis present

## 2014-02-02 DIAGNOSIS — Z8249 Family history of ischemic heart disease and other diseases of the circulatory system: Secondary | ICD-10-CM

## 2014-02-02 DIAGNOSIS — F3289 Other specified depressive episodes: Secondary | ICD-10-CM | POA: Diagnosis present

## 2014-02-02 DIAGNOSIS — Z9981 Dependence on supplemental oxygen: Secondary | ICD-10-CM

## 2014-02-02 DIAGNOSIS — Z87891 Personal history of nicotine dependence: Secondary | ICD-10-CM

## 2014-02-02 DIAGNOSIS — T380X5A Adverse effect of glucocorticoids and synthetic analogues, initial encounter: Secondary | ICD-10-CM | POA: Diagnosis present

## 2014-02-02 DIAGNOSIS — R6520 Severe sepsis without septic shock: Secondary | ICD-10-CM

## 2014-02-02 DIAGNOSIS — M25519 Pain in unspecified shoulder: Secondary | ICD-10-CM | POA: Diagnosis present

## 2014-02-02 DIAGNOSIS — I4892 Unspecified atrial flutter: Secondary | ICD-10-CM | POA: Diagnosis present

## 2014-02-02 DIAGNOSIS — Z85118 Personal history of other malignant neoplasm of bronchus and lung: Secondary | ICD-10-CM

## 2014-02-02 DIAGNOSIS — M8448XA Pathological fracture, other site, initial encounter for fracture: Secondary | ICD-10-CM | POA: Diagnosis present

## 2014-02-02 DIAGNOSIS — J9620 Acute and chronic respiratory failure, unspecified whether with hypoxia or hypercapnia: Secondary | ICD-10-CM | POA: Diagnosis present

## 2014-02-02 DIAGNOSIS — J439 Emphysema, unspecified: Secondary | ICD-10-CM | POA: Diagnosis present

## 2014-02-02 DIAGNOSIS — Y92009 Unspecified place in unspecified non-institutional (private) residence as the place of occurrence of the external cause: Secondary | ICD-10-CM

## 2014-02-02 DIAGNOSIS — F329 Major depressive disorder, single episode, unspecified: Secondary | ICD-10-CM | POA: Diagnosis present

## 2014-02-02 DIAGNOSIS — E039 Hypothyroidism, unspecified: Secondary | ICD-10-CM | POA: Diagnosis present

## 2014-02-02 DIAGNOSIS — N179 Acute kidney failure, unspecified: Secondary | ICD-10-CM

## 2014-02-02 DIAGNOSIS — IMO0002 Reserved for concepts with insufficient information to code with codable children: Secondary | ICD-10-CM

## 2014-02-02 DIAGNOSIS — R748 Abnormal levels of other serum enzymes: Secondary | ICD-10-CM | POA: Diagnosis present

## 2014-02-02 DIAGNOSIS — K219 Gastro-esophageal reflux disease without esophagitis: Secondary | ICD-10-CM | POA: Diagnosis present

## 2014-02-02 DIAGNOSIS — Z7901 Long term (current) use of anticoagulants: Secondary | ICD-10-CM

## 2014-02-02 DIAGNOSIS — F411 Generalized anxiety disorder: Secondary | ICD-10-CM | POA: Diagnosis present

## 2014-02-02 LAB — CBC WITH DIFFERENTIAL/PLATELET
BASOS ABS: 0 10*3/uL (ref 0.0–0.1)
Basophils Relative: 0 % (ref 0–1)
Eosinophils Absolute: 0 10*3/uL (ref 0.0–0.7)
Eosinophils Relative: 0 % (ref 0–5)
HEMATOCRIT: 36.3 % (ref 36.0–46.0)
Hemoglobin: 11.6 g/dL — ABNORMAL LOW (ref 12.0–15.0)
LYMPHS PCT: 3 % — AB (ref 12–46)
Lymphs Abs: 0.6 10*3/uL — ABNORMAL LOW (ref 0.7–4.0)
MCH: 29.7 pg (ref 26.0–34.0)
MCHC: 32 g/dL (ref 30.0–36.0)
MCV: 92.8 fL (ref 78.0–100.0)
Monocytes Absolute: 0.8 10*3/uL (ref 0.1–1.0)
Monocytes Relative: 3 % (ref 3–12)
Neutro Abs: 21.9 10*3/uL — ABNORMAL HIGH (ref 1.7–7.7)
Neutrophils Relative %: 94 % — ABNORMAL HIGH (ref 43–77)
PLATELETS: 262 10*3/uL (ref 150–400)
RBC: 3.91 MIL/uL (ref 3.87–5.11)
RDW: 14 % (ref 11.5–15.5)
WBC: 23.3 10*3/uL — ABNORMAL HIGH (ref 4.0–10.5)

## 2014-02-02 LAB — I-STAT CHEM 8, ED
BUN: 31 mg/dL — AB (ref 6–23)
CREATININE: 1.5 mg/dL — AB (ref 0.50–1.10)
Calcium, Ion: 1.13 mmol/L (ref 1.13–1.30)
Chloride: 100 mEq/L (ref 96–112)
GLUCOSE: 178 mg/dL — AB (ref 70–99)
HCT: 38 % (ref 36.0–46.0)
Hemoglobin: 12.9 g/dL (ref 12.0–15.0)
Potassium: 4.4 mEq/L (ref 3.7–5.3)
SODIUM: 139 meq/L (ref 137–147)
TCO2: 25 mmol/L (ref 0–100)

## 2014-02-02 LAB — I-STAT ARTERIAL BLOOD GAS, ED
Bicarbonate: 27 mEq/L — ABNORMAL HIGH (ref 20.0–24.0)
O2 SAT: 96 %
Patient temperature: 98.8
TCO2: 29 mmol/L (ref 0–100)
pCO2 arterial: 56 mmHg — ABNORMAL HIGH (ref 35.0–45.0)
pH, Arterial: 7.292 — ABNORMAL LOW (ref 7.350–7.450)
pO2, Arterial: 90 mmHg (ref 80.0–100.0)

## 2014-02-02 LAB — I-STAT VENOUS BLOOD GAS, ED
ACID-BASE DEFICIT: 2 mmol/L (ref 0.0–2.0)
Bicarbonate: 26.4 mEq/L — ABNORMAL HIGH (ref 20.0–24.0)
O2 Saturation: 80 %
TCO2: 28 mmol/L (ref 0–100)
pCO2, Ven: 58.5 mmHg — ABNORMAL HIGH (ref 45.0–50.0)
pH, Ven: 7.263 (ref 7.250–7.300)
pO2, Ven: 52 mmHg — ABNORMAL HIGH (ref 30.0–45.0)

## 2014-02-02 LAB — BASIC METABOLIC PANEL
BUN: 31 mg/dL — ABNORMAL HIGH (ref 6–23)
CHLORIDE: 96 meq/L (ref 96–112)
CO2: 23 meq/L (ref 19–32)
Calcium: 9.2 mg/dL (ref 8.4–10.5)
Creatinine, Ser: 1.45 mg/dL — ABNORMAL HIGH (ref 0.50–1.10)
GFR calc Af Amer: 40 mL/min — ABNORMAL LOW (ref >90)
GFR calc non Af Amer: 34 mL/min — ABNORMAL LOW (ref >90)
Glucose, Bld: 187 mg/dL — ABNORMAL HIGH (ref 70–99)
Potassium: 4.6 mEq/L (ref 3.7–5.3)
SODIUM: 139 meq/L (ref 137–147)

## 2014-02-02 LAB — ABO/RH: ABO/RH(D): A POS

## 2014-02-02 LAB — URINE MICROSCOPIC-ADD ON

## 2014-02-02 LAB — URINALYSIS, ROUTINE W REFLEX MICROSCOPIC
GLUCOSE, UA: NEGATIVE mg/dL
KETONES UR: 15 mg/dL — AB
NITRITE: POSITIVE — AB
PROTEIN: 100 mg/dL — AB
Specific Gravity, Urine: >1.03 — ABNORMAL HIGH (ref 1.005–1.030)
UROBILINOGEN UA: 1 mg/dL (ref 0.0–1.0)
pH: 5 (ref 5.0–8.0)

## 2014-02-02 LAB — TYPE AND SCREEN
ABO/RH(D): A POS
Antibody Screen: NEGATIVE

## 2014-02-02 LAB — PROTIME-INR
INR: 3.87 — ABNORMAL HIGH (ref 0.00–1.49)
PROTHROMBIN TIME: 36.5 s — AB (ref 11.6–15.2)

## 2014-02-02 LAB — I-STAT TROPONIN, ED: Troponin i, poc: 0.22 ng/mL (ref 0.00–0.08)

## 2014-02-02 LAB — LACTIC ACID, PLASMA
LACTIC ACID, VENOUS: 5.7 mmol/L — AB (ref 0.5–2.2)
Lactic Acid, Venous: 1.6 mmol/L (ref 0.5–2.2)

## 2014-02-02 LAB — TROPONIN I
TROPONIN I: 0.63 ng/mL — AB (ref <0.30)
Troponin I: 0.53 ng/mL (ref <0.30)
Troponin I: 0.39 ng/mL (ref <0.30)

## 2014-02-02 LAB — MRSA PCR SCREENING: MRSA by PCR: NEGATIVE

## 2014-02-02 LAB — CBG MONITORING, ED: Glucose-Capillary: 205 mg/dL — ABNORMAL HIGH (ref 70–99)

## 2014-02-02 MED ORDER — ALPRAZOLAM 0.25 MG PO TABS
0.2500 mg | ORAL_TABLET | Freq: Three times a day (TID) | ORAL | Status: DC | PRN
  Administered 2014-02-02 – 2014-02-09 (×9): 0.25 mg via ORAL
  Filled 2014-02-02 (×10): qty 1

## 2014-02-02 MED ORDER — ASPIRIN 300 MG RE SUPP
300.0000 mg | Freq: Once | RECTAL | Status: AC
  Administered 2014-02-02: 300 mg via RECTAL
  Filled 2014-02-02: qty 1

## 2014-02-02 MED ORDER — DILTIAZEM HCL ER COATED BEADS 240 MG PO CP24
240.0000 mg | ORAL_CAPSULE | Freq: Every day | ORAL | Status: DC
  Administered 2014-02-02 – 2014-02-10 (×8): 240 mg via ORAL
  Filled 2014-02-02 (×9): qty 1

## 2014-02-02 MED ORDER — METHYLPREDNISOLONE SODIUM SUCC 125 MG IJ SOLR
125.0000 mg | Freq: Once | INTRAMUSCULAR | Status: AC
Start: 2014-02-02 — End: 2014-02-02
  Administered 2014-02-02: 125 mg via INTRAVENOUS
  Filled 2014-02-02: qty 2

## 2014-02-02 MED ORDER — ALBUTEROL SULFATE (2.5 MG/3ML) 0.083% IN NEBU: 5.0000 mg | INHALATION_SOLUTION | Freq: Once | RESPIRATORY_TRACT | Status: DC

## 2014-02-02 MED ORDER — IPRATROPIUM-ALBUTEROL 0.5-2.5 (3) MG/3ML IN SOLN
3.0000 mL | Freq: Four times a day (QID) | RESPIRATORY_TRACT | Status: DC | PRN
Start: 2014-02-02 — End: 2014-02-10
  Administered 2014-02-02 – 2014-02-06 (×3): 3 mL via RESPIRATORY_TRACT
  Filled 2014-02-02 (×3): qty 3

## 2014-02-02 MED ORDER — ALBUTEROL SULFATE (2.5 MG/3ML) 0.083% IN NEBU
2.5000 mg | INHALATION_SOLUTION | Freq: Four times a day (QID) | RESPIRATORY_TRACT | Status: DC | PRN
  Administered 2014-02-07 – 2014-02-09 (×4): 2.5 mg via RESPIRATORY_TRACT
  Filled 2014-02-02 (×4): qty 3

## 2014-02-02 MED ORDER — SODIUM CHLORIDE 0.9 % IV BOLUS (SEPSIS)
1000.0000 mL | Freq: Once | INTRAVENOUS | Status: AC
  Administered 2014-02-02: 1000 mL via INTRAVENOUS

## 2014-02-02 MED ORDER — DEXTROSE 5 % IV SOLN
1.0000 g | INTRAVENOUS | Status: DC
  Administered 2014-02-02 – 2014-02-10 (×9): 1 g via INTRAVENOUS
  Filled 2014-02-02 (×10): qty 1

## 2014-02-02 MED ORDER — ASPIRIN 81 MG PO CHEW: 324.0000 mg | CHEWABLE_TABLET | Freq: Once | ORAL | Status: DC

## 2014-02-02 MED ORDER — ALBUTEROL SULFATE HFA 108 (90 BASE) MCG/ACT IN AERS: 2.0000 | INHALATION_SPRAY | Freq: Four times a day (QID) | RESPIRATORY_TRACT | Status: DC | PRN

## 2014-02-02 MED ORDER — ALBUTEROL SULFATE (2.5 MG/3ML) 0.083% IN NEBU
5.0000 mg | INHALATION_SOLUTION | Freq: Once | RESPIRATORY_TRACT | Status: AC
  Administered 2014-02-02: 5 mg via RESPIRATORY_TRACT
  Filled 2014-02-02: qty 6

## 2014-02-02 MED ORDER — HYDROCODONE-HOMATROPINE 5-1.5 MG/5ML PO SYRP
5.0000 mL | ORAL_SOLUTION | Freq: Four times a day (QID) | ORAL | Status: DC | PRN
  Administered 2014-02-02: 5 mL via ORAL
  Filled 2014-02-02: qty 5

## 2014-02-02 MED ORDER — PIPERACILLIN-TAZOBACTAM IN DEX 2-0.25 GM/50ML IV SOLN
2.2500 g | Freq: Once | INTRAVENOUS | Status: DC
  Administered 2014-02-02: 2.25 g via INTRAVENOUS
  Filled 2014-02-02: qty 50

## 2014-02-02 MED ORDER — FLECAINIDE ACETATE 50 MG PO TABS
50.0000 mg | ORAL_TABLET | Freq: Two times a day (BID) | ORAL | Status: DC
  Administered 2014-02-05 – 2014-02-10 (×11): 50 mg via ORAL
  Filled 2014-02-02 (×13): qty 1

## 2014-02-02 MED ORDER — WARFARIN - PHARMACIST DOSING INPATIENT
Freq: Every day | Status: DC
  Administered 2014-02-05: 18:00:00

## 2014-02-02 MED ORDER — NYSTATIN 100000 UNIT/ML MT SUSP: 5.0000 mL | Freq: Three times a day (TID) | OROMUCOSAL | Status: DC | PRN

## 2014-02-02 MED ORDER — MOMETASONE FURO-FORMOTEROL FUM 100-5 MCG/ACT IN AERO
2.0000 | INHALATION_SPRAY | Freq: Two times a day (BID) | RESPIRATORY_TRACT | Status: DC
  Administered 2014-02-02 – 2014-02-10 (×14): 2 via RESPIRATORY_TRACT
  Filled 2014-02-02: qty 8.8

## 2014-02-02 MED ORDER — METHYLPREDNISOLONE SODIUM SUCC 125 MG IJ SOLR
60.0000 mg | Freq: Two times a day (BID) | INTRAMUSCULAR | Status: DC
  Administered 2014-02-02 – 2014-02-04 (×4): 60 mg via INTRAVENOUS
  Filled 2014-02-02: qty 2
  Filled 2014-02-02: qty 0.96
  Filled 2014-02-02: qty 2
  Filled 2014-02-02 (×3): qty 0.96

## 2014-02-02 MED ORDER — FLECAINIDE ACETATE 50 MG PO TABS
50.0000 mg | ORAL_TABLET | Freq: Two times a day (BID) | ORAL | Status: DC
  Administered 2014-02-02: 50 mg via ORAL
  Filled 2014-02-02 (×2): qty 1

## 2014-02-02 MED ORDER — BUPROPION HCL ER (SR) 150 MG PO TB12
150.0000 mg | ORAL_TABLET | Freq: Two times a day (BID) | ORAL | Status: DC
  Administered 2014-02-02 – 2014-02-10 (×16): 150 mg via ORAL
  Filled 2014-02-02 (×18): qty 1

## 2014-02-02 MED ORDER — GUAIFENESIN ER 600 MG PO TB12
600.0000 mg | ORAL_TABLET | Freq: Two times a day (BID) | ORAL | Status: DC | PRN
  Administered 2014-02-02: 600 mg via ORAL
  Filled 2014-02-02: qty 1

## 2014-02-02 MED ORDER — CYCLOBENZAPRINE HCL 10 MG PO TABS
10.0000 mg | ORAL_TABLET | Freq: Three times a day (TID) | ORAL | Status: DC | PRN
  Administered 2014-02-02 – 2014-02-09 (×6): 10 mg via ORAL
  Filled 2014-02-02 (×6): qty 1

## 2014-02-02 MED ORDER — MONTELUKAST SODIUM 10 MG PO TABS
10.0000 mg | ORAL_TABLET | Freq: Every day | ORAL | Status: DC
  Administered 2014-02-02 – 2014-02-09 (×8): 10 mg via ORAL
  Filled 2014-02-02 (×9): qty 1

## 2014-02-02 MED ORDER — TRAMADOL HCL 50 MG PO TABS
50.0000 mg | ORAL_TABLET | Freq: Four times a day (QID) | ORAL | Status: DC | PRN
  Administered 2014-02-02 – 2014-02-09 (×11): 50 mg via ORAL
  Filled 2014-02-02 (×11): qty 1

## 2014-02-02 MED ORDER — ACETAMINOPHEN 325 MG PO TABS
650.0000 mg | ORAL_TABLET | Freq: Four times a day (QID) | ORAL | Status: DC | PRN
  Administered 2014-02-02 (×2): 650 mg via ORAL
  Filled 2014-02-02 (×2): qty 2

## 2014-02-02 MED ORDER — VANCOMYCIN HCL IN DEXTROSE 1-5 GM/200ML-% IV SOLN
1000.0000 mg | Freq: Once | INTRAVENOUS | Status: AC
  Administered 2014-02-02: 1000 mg via INTRAVENOUS
  Filled 2014-02-02: qty 200

## 2014-02-02 MED ORDER — BIOTENE DRY MOUTH MT LIQD
15.0000 mL | Freq: Two times a day (BID) | OROMUCOSAL | Status: DC
  Administered 2014-02-02 – 2014-02-10 (×14): 15 mL via OROMUCOSAL

## 2014-02-02 MED ORDER — LEVOTHYROXINE SODIUM 75 MCG PO TABS
75.0000 ug | ORAL_TABLET | Freq: Every day | ORAL | Status: DC
  Administered 2014-02-02 – 2014-02-10 (×9): 75 ug via ORAL
  Filled 2014-02-02 (×10): qty 1

## 2014-02-02 MED ORDER — VANCOMYCIN HCL IN DEXTROSE 1-5 GM/200ML-% IV SOLN
1000.0000 mg | INTRAVENOUS | Status: DC
  Administered 2014-02-03 – 2014-02-06 (×4): 1000 mg via INTRAVENOUS
  Filled 2014-02-02 (×4): qty 200

## 2014-02-02 MED ORDER — THEOPHYLLINE ER 300 MG PO TB12
150.0000 mg | ORAL_TABLET | Freq: Two times a day (BID) | ORAL | Status: DC
  Administered 2014-02-02: 150 mg via ORAL
  Administered 2014-02-02: 10:00:00 via ORAL
  Administered 2014-02-03 – 2014-02-10 (×14): 150 mg via ORAL
  Filled 2014-02-02 (×19): qty 1

## 2014-02-02 MED ORDER — SODIUM CHLORIDE 0.9 % IV SOLN
INTRAVENOUS | Status: DC
  Administered 2014-02-02: 07:00:00 via INTRAVENOUS

## 2014-02-02 MED ORDER — TIOTROPIUM BROMIDE MONOHYDRATE 18 MCG IN CAPS
18.0000 ug | ORAL_CAPSULE | Freq: Every day | RESPIRATORY_TRACT | Status: DC
  Administered 2014-02-02 – 2014-02-08 (×5): 18 ug via RESPIRATORY_TRACT
  Filled 2014-02-02 (×2): qty 5

## 2014-02-02 MED ORDER — RISEDRONATE SODIUM 5 MG PO TABS: 35.0000 mg | ORAL_TABLET | ORAL | Status: DC

## 2014-02-02 NOTE — Progress Notes (Signed)
eLink Physician-Brief Progress Note Patient Name: [name] DOB: 12-12-1935 MRN: [id number]  Date of Service  02/02/2014   HPI/Events of Note  Pt c/o back spasms  eICU Interventions  Flexeril prn ordered   Intervention Category Intermediate Interventions: Pain - evaluation and management  Elsie Stain 02/02/2014, 6:37 PM

## 2014-02-02 NOTE — ED Notes (Signed)
Per EMS, EMS was called out to pts home for fall. When EMS arrived pt was on floor but alert x 4. Pt had to use bathroom so EMS assisted pt to the bathroom after pt was done EMS assisted pt to chair. Pt then had an episode of unresponsiveness lasting approximately 30 seconds. Pt then woke up and was alert x 4 but having increased difficulty breathing. EMS gave a total of 10 of albuterol and 1 of Atrovent. Pt denies pain from fall. Pt denies CP Pt alert x 4 at this time. Pt placed on Bipap upon arrival.

## 2014-02-02 NOTE — Progress Notes (Signed)
ANTICOAGULATION/ANTIBIOTIC CONSULT NOTE - Initial Consult  Pharmacy Consult for Coumadin, Vancomycin, and Cefepime Indication: atrial fibrillation and HCAP  Allergies  Allergen Reactions  . Clarithromycin     REACTION: nausea    Patient Measurements:   Ht: 65 in  Wt: 66 kg  Vital Signs: Temp: 98.3 F (36.8 C) (05/18 0306) Temp src: Oral (05/18 0306) BP: 99/46 mmHg (05/18 0415) Pulse Rate: 99 (05/18 0415)  Labs:  Recent Labs  02/02/14 0403  HGB 12.9  11.6*  HCT 38.0  36.3  PLT 262  CREATININE 1.50*  1.45*    The CrCl is unknown because both a height and weight (above a minimum accepted value) are required for this calculation.   Medical History: Past Medical History  Diagnosis Date  . Unspecified sinusitis (chronic)   . Obstructive sleep apnea   . COPD (chronic obstructive pulmonary disease)     on home O2, chronic steroid use  . Asthma   . Lung nodule   . SVT (supraventricular tachycardia)   . Atrial flutter     typical appearing  . Hypothyroidism   . GERD (gastroesophageal reflux disease)   . Anxiety   . Depression   . History of radiation therapy 11/08/2009-11/18/2009    left upper lung  . Allergy   . Cancer     lung ca s/p XRT 2011  . Lung cancer     non-small cell ca let upper lung stage IA    Medications:  See electronic med rec  Assessment: 78 y.o. female presents with syncopal episode.   Anticoagulation: Pt on coumadin PTA for afib. Baseline INR 3.87 on 2mg  daily. CBC stable at baseline. No bleeding noted.  Infectious Disease: Vancomycin and Cefepime for HCAP. LA 5.7. Wbc elevated. Afeb. Pt received 1gm Vanc ~0500 in ED.  5/18 Vanc>> 5/18 Cefepime>>  5/18 Bld x 2>> Sputum>>  Goal of Therapy:  INR 2-3 Monitor platelets by anticoagulation protocol: Yes Vancomycin trough 15-20 mcg/ml   Plan:  1) Cefepime 1gm IV q24h 2) Vancomycin 500mg  IV q12h 3) Will f/u micro data, pt's clinical condition, renal function, vanc trough prn 4)  No coumadin today 5) Daily INR  Sherlon Handing, PharmD, BCPS Clinical pharmacist, pager [phone] 02/02/2014,5:15 AM

## 2014-02-02 NOTE — Progress Notes (Signed)
ANTIBIOTIC CONSULT NOTE - FOLLOW UP  Pharmacy Consult for vancomycin, cefepime Indication: HCAP  Allergies  Allergen Reactions  . Clarithromycin     REACTION: nausea    Patient Measurements: Height: 5\' 5"  (165.1 cm) Weight: 144 lb (65.318 kg) IBW/kg (Calculated) : 57  Vital Signs: Temp: 100 F (37.8 C) (05/18 1131) Temp src: Oral (05/18 1131) BP: 121/59 mmHg (05/18 1400) Pulse Rate: 91 (05/18 1400) Intake/Output from previous day: 05/17 0701 - 05/18 0700 In: 70 [I.V.:20; IV Piggyback:50] Out: 1 [Urine:1] Intake/Output from this shift: Total I/O In: 685 [P.O.:480; I.V.:155; IV Piggyback:50] Out: -   Labs:  Recent Labs  02/02/14 0403  WBC 23.3*  HGB 12.9  11.6*  PLT 262  CREATININE 1.50*  1.45*   Estimated Creatinine Clearance: 29.2 ml/min (by C-G formula based on Cr of 1.5). No results found for this basename: VANCOTROUGH, Corlis Leak, VANCORANDOM, GENTTROUGH, GENTPEAK, GENTRANDOM, TOBRATROUGH, TOBRAPEAK, TOBRARND, AMIKACINPEAK, AMIKACINTROU, AMIKACIN,  in the last 72 hours   Microbiology: Recent Results (from the past 720 hour(s))  MRSA PCR SCREENING     Status: None   Collection Time    02/02/14  6:27 AM      Result Value Ref Range Status   MRSA by PCR NEGATIVE  NEGATIVE Final   Comment:            The GeneXpert MRSA Assay (FDA     approved for NASAL specimens     only), is one component of a     comprehensive MRSA colonization     surveillance program. It is not     intended to diagnose MRSA     infection nor to guide or     monitor treatment for     MRSA infections.    Anti-infectives   Start     Dose/Rate Route Frequency Ordered Stop   02/03/14 0500  vancomycin (VANCOCIN) IVPB 1000 mg/200 mL premix     1,000 mg 200 mL/hr over 60 Minutes Intravenous Every 24 hours 02/02/14 1434     02/02/14 0530  ceFEPIme (MAXIPIME) 1 g in dextrose 5 % 50 mL IVPB     1 g 100 mL/hr over 30 Minutes Intravenous Every 24 hours 02/02/14 0529     02/02/14 0500   piperacillin-tazobactam (ZOSYN) IVPB 2.25 g  Status:  Discontinued     2.25 g 100 mL/hr over 30 Minutes Intravenous  Once 02/02/14 0441 02/02/14 0704   02/02/14 0445  vancomycin (VANCOCIN) IVPB 1000 mg/200 mL premix     1,000 mg 200 mL/hr over 60 Minutes Intravenous  Once 02/02/14 0441 02/02/14 0602      Assessment: 78 yo female with possible PNA on vancomycin and cefepime. SCr= 1.45 and CrCl ~ 30.  5/18 Vanc>> 5/18 Cefepime>>  5/18 Bld x 2>> Sputum>>  Goal of Therapy:  Vancomycin trough level 15-20 mcg/ml  Plan:  -Will change vancomycin to 1000mg  IV q24h -No cefepime changes needed -Will follow renal function, cultures and clinical progress  Hildred Laser, Pharm D 02/02/2014 2:37 PM

## 2014-02-02 NOTE — Consult Note (Signed)
PULMONARY / CRITICAL CARE MEDICINE   Name: [name] MRN: [id number] DOB: 1935/11/12    ADMISSION DATE:  02/02/2014 CONSULTATION DATE:  02/02/2014   REFERRING MD :  [facility] PRIMARY SERVICE: [facility]  CHIEF COMPLAINT:  Syncope, resp failure  BRIEF PATIENT DESCRIPTION: 78 y.o (CY pt) with severe COPD on 3L home o2, OSA & LUL nodule s/p R in 2011 , recent outpt Rx with prednisone a week PTA for flare, adm with syncope & hypercarbic resp failure requiring bipap, DNI/R established, lactate 5  SIGNIFICANT EVENTS / STUDIES:  5/18 adm on bipap  LINES / TUBES:   CULTURES:   ANTIBIOTICS: 5/18 zosyn >>  HISTORY OF PRESENT ILLNESS:  78 yo former smoker with severe COPD/ chronic hypoxic respiratory failure, hx lung nodule treated with XRT as presumptive Ca w/o bx -2011, OSA ., Hx SVT  Acute OV 01/23/14 for cough with green sputum for 2 days now thick white sputum, increase SOB, unbalanced with her walking, wheezing. S/p pred taper recently, rpt given -on 5 mg prednisone daily maintenance  & Xolair . She has been treating a cold back and forth with her husband. Compliant with CPAP. Per ED notes - 'EMS was called out to pts home for fall. When EMS arrived pt was on floor but alert x 4. Assisted to BR & to chair -then had an episode of unresponsiveness lasting approximately 30 seconds. Pt then woke up and was alert x 4 but having increased difficulty breathing' Placed on bipap ona rrival to ED     PAST MEDICAL HISTORY :  Past Medical History  Diagnosis Date  . Unspecified sinusitis (chronic)   . Obstructive sleep apnea   . COPD (chronic obstructive pulmonary disease)     on home O2, chronic steroid use  . Asthma   . Lung nodule   . SVT (supraventricular tachycardia)   . Atrial flutter     typical appearing  . Hypothyroidism   . GERD (gastroesophageal reflux disease)   . Anxiety   . Depression   . History of radiation therapy 11/08/2009-11/18/2009    left upper lung  . Allergy   .  Cancer     lung ca s/p XRT 2011  . Lung cancer     non-small cell ca let upper lung stage IA   Past Surgical History  Procedure Laterality Date  . Hysterectomy      abdominal  . Tonsillectomy    . Cystectomy      removal from scalp  . Bladder repair      bladder tack  . Appendectomy    . Lung biopsy      Needle bx lung nodule - atypical cells- complicated by PTX/ chest tube  . Abdominal hysterectomy    . Svt ablation  9//26/13    AVNRT and CTI ablation by Dr Rayann Heman   Prior to Admission medications   Medication Sig Start Date End Date Taking? Authorizing Provider  albuterol (PROVENTIL HFA;VENTOLIN HFA) 108 (90 BASE) MCG/ACT inhaler Inhale 2 puffs into the lungs every 6 (six) hours as needed. For shortness of breath   Yes Historical Provider, MD  buPROPion (WELLBUTRIN SR) 150 MG 12 hr tablet Take 150 mg by mouth 2 (two) times daily.    Yes Historical Provider, MD  diltiazem (CARDIZEM CD) 240 MG 24 hr capsule take 1 capsule by mouth once daily 07/17/13  Yes Jettie Booze, MD  EPINEPHrine (EPIPEN) 0.3 mg/0.3 mL DEVI Inject 0.3 mg into the muscle as needed.  For allergic reactions   Yes Historical Provider, MD  flecainide (TAMBOCOR) 50 MG tablet take 1 tablet by mouth every 12 hours   Yes Jettie Booze, MD  Fluticasone-Salmeterol (ADVAIR) 250-50 MCG/DOSE AEPB Inhale 1 puff into the lungs every 12 (twelve) hours. 07/18/13  Yes Deneise Lever, MD  guaiFENesin (MUCINEX) 600 MG 12 hr tablet Take 600 mg by mouth every 12 (twelve) hours as needed. For congestion   Yes Historical Provider, MD  HYDROcodone-homatropine (HYCODAN) 5-1.5 MG/5ML syrup Take 5 mLs by mouth every 6 (six) hours as needed for cough. 01/23/14  Yes Deneise Lever, MD  ipratropium-albuterol (DUONEB) 0.5-2.5 (3) MG/3ML SOLN Take 3 mLs by nebulization every 6 (six) hours as needed. For shortness of breath 05/12/13  Yes Deneise Lever, MD  levothyroxine (SYNTHROID, LEVOTHROID) 75 MCG tablet Take 75 mcg by mouth daily.    Yes Historical Provider, MD  montelukast (SINGULAIR) 10 MG tablet Take 1 tablet (10 mg total) by mouth at bedtime. 10/14/13  Yes Deneise Lever, MD  NON FORMULARY CPAP    Yes Historical Provider, MD  NON FORMULARY Oxygen 2-4 LPM   Yes Historical Provider, MD  nystatin (MYCOSTATIN) 100000 UNIT/ML suspension Take 5 mLs by mouth 3 (three) times daily as needed (pain).   Yes Historical Provider, MD  omalizumab Arvid Right) 150 MG injection Inject 225 mg into the skin every 14 (fourteen) days. Friday   Yes Historical Provider, MD  predniSONE (DELTASONE) 5 MG tablet Take 1 tablet (5 mg total) by mouth daily. 01/21/14  Yes Deneise Lever, MD  risedronate (ACTONEL) 35 MG tablet Take 35 mg by mouth every 7 (seven) days. with water on empty stomach, nothing by mouth or lie down for next 30 minutes; takes on Saturdays   Yes Historical Provider, MD  theophylline (THEODUR) 300 MG 12 hr tablet Take 0.5 tablets (150 mg total) by mouth 2 (two) times daily. 10/14/13  Yes Deneise Lever, MD  tiotropium (SPIRIVA) 18 MCG inhalation capsule Place 18 mcg into inhaler and inhale daily.   Yes Historical Provider, MD  traMADol (ULTRAM) 50 MG tablet Take 1 tablet by mouth 4 (four) times daily as needed (pain).  10/25/11  Yes Historical Provider, MD  venlafaxine (EFFEXOR) 37.5 MG tablet 37.5 mg. Take one time daily   Yes Historical Provider, MD  warfarin (COUMADIN) 2 MG tablet Take 2 mg by mouth every evening.   Yes Historical Provider, MD  zolpidem (AMBIEN) 5 MG tablet Take 2.5 mg by mouth at bedtime as needed. For sleep   Yes Historical Provider, MD  amoxicillin-clavulanate (AUGMENTIN) 500-125 MG per tablet 1 twice daily 01/23/14   Deneise Lever, MD  predniSONE (DELTASONE) 10 MG tablet Take 4 tabs daily x 2 days, 3 tabs daily x 2 days, 2 tabs daily x 2 days, 1 tab daily x 2 days then stop 01/23/14   Deneise Lever, MD   Allergies  Allergen Reactions  . Clarithromycin     REACTION: nausea    FAMILY HISTORY:  Family History   Problem Relation Age of Onset  . Heart disease Mother 24   SOCIAL HISTORY:  reports that she quit smoking about 27 years ago. Her smoking use included Cigarettes. She has a 80 pack-year smoking history. She has never used smokeless tobacco. She reports that she does not drink alcohol or use illicit drugs.  REVIEW OF SYSTEMS:  Constitutional: negative for anorexia, fevers and sweats  Eyes: negative for irritation, redness and visual disturbance  Ears, nose, mouth, throat, and face: negative for earaches, epistaxis, nasal congestion and sore throat  Respiratory: negative for wheezing , COugh with white sputum Cardiovascular: negative for chest pain, dyspnea, lower extremity edema, orthopnea, palpitations and syncope  Gastrointestinal: negative for abdominal pain, constipation, diarrhea, melena, nausea and vomiting  Genitourinary:negative for dysuria, frequency and hematuria  Hematologic/lymphatic: negative for bleeding, easy bruising and lymphadenopathy  Musculoskeletal:negative for arthralgias, muscle weakness and stiff joints  Neurological: negative for coordination problems, , headaches  -POS for gait problems and weakness  Endocrine: negative for diabetic symptoms including polydipsia, polyuria and weight loss   SUBJECTIVE: comfortable on bipap  VITAL SIGNS: Temp:  [98.2 F (36.8 C)-98.8 F (37.1 C)] 98.2 F (36.8 C) (05/18 0630) Pulse Rate:  [90-108] 99 (05/18 0630) Resp:  [20-35] 20 (05/18 0630) BP: (97-147)/(46-69) 147/69 mmHg (05/18 0630) SpO2:  [87 %-98 %] 87 % (05/18 0630) FiO2 (%):  [40 %-50 %] 40 % (05/18 0630) Weight:  [65.318 kg (144 lb)] 65.318 kg (144 lb) (05/18 0630) HEMODYNAMICS:   VENTILATOR SETTINGS: Vent Mode:  [-]  FiO2 (%):  [40 %-50 %] 40 % INTAKE / OUTPUT: Intake/Output     05/17 0701 - 05/18 0700 05/18 0701 - 05/19 0700   I.V. (mL/kg) 10 (0.2)    IV Piggyback 50    Total Intake(mL/kg) 60 (0.9)    Urine (mL/kg/hr) 1    Total Output 1     Net +59             PHYSICAL EXAMINATION: Gen. Pleasant, well-nourished, in no distress, normal affect, on bipap, full face mask ENT - no lesions, no post nasal drip Neck: No JVD, no thyromegaly, no carotid bruits Lungs: no use of accessory muscles, no dullness to percussion, clear without rales or rhonchi  Cardiovascular: Rhythm regular, heart sounds  normal, no murmurs, no peripheral edema Abdomen: soft and non-tender, no hepatosplenomegaly, BS normal. Musculoskeletal: No deformities, no cyanosis or clubbing Neuro:  alert, non focal Skin:  Warm, no lesions/ rash   LABS:  CBC  Recent Labs Lab 02/02/14 0403  WBC 23.3*  HGB 12.9  11.6*  HCT 38.0  36.3  PLT 262   Coag's  Recent Labs Lab 02/02/14 0403  INR 3.87*   BMET  Recent Labs Lab 02/02/14 0403  NA 139  139  K 4.4  4.6  CL 100  96  CO2 23  BUN 31*  31*  CREATININE 1.50*  1.45*  GLUCOSE 178*  187*   Electrolytes  Recent Labs Lab 02/02/14 0403  CALCIUM 9.2   Sepsis Markers  Recent Labs Lab 02/02/14 0403  LATICACIDVEN 5.7*   ABG  Recent Labs Lab 02/02/14 0546  PHART 7.292*  PCO2ART 56.0*  PO2ART 90.0   Liver Enzymes No results found for this basename: AST, ALT, ALKPHOS, BILITOT, ALBUMIN,  in the last 168 hours Cardiac Enzymes No results found for this basename: TROPONINI, PROBNP,  in the last 168 hours Glucose  Recent Labs Lab 02/02/14 0442  GLUCAP 205*    Imaging Dg Chest Port 1 View  02/02/2014   CLINICAL DATA:  Shortness of breath and respiratory distress.  EXAM: PORTABLE CHEST - 1 VIEW  COMPARISON:  DG RIBS 2V*L* dated 03/31/2013; CT CHEST W/O CM dated 03/06/2013  FINDINGS: Cardiac silhouette is unremarkable and unchanged. Mildly calcified aortic knob. Increased lung volumes with mild chronic interstitial changes. Nodularity in left lung base may correspond to lingular density seen on prior CT. No pleural effusions. No pneumothorax.  Left rib fractures seen on prior radiograph show  interval healing. Osteopenia. Soft tissue planes are nonsuspicious.  IMPRESSION: COPD without superimposed acute cardiopulmonary process.   Electronically Signed   By: Elon Alas   On: 02/02/2014 03:38     CXR: old left rib fractures, no infx or effusion  ASSESSMENT / PLAN:  PULMONARY A:Acute hypercarbic resp failure Chronic hypoxic resp failure COPD exacerbation -recently treated with steroids, no bspasm on exam Allergies - on xolair & allergy shots OSA P:   Stress dose steroids - can rapidly taper down once clinical improvement to maintenance dose of 5 mg daily Bipap 12/6 during sleep, then back on [place] Ct duonebs Ct singulair  CARDIOVASCULAR A: Atrial flutter/ fibrillation -on coumadin Syncope Echo 05/2012 - RVSP 45, gr 2 diastolic dysfn Lactic acidosis - related to fall, doubt sepsis P:  Ct flecainide/ warferin per INR Tele Consider rpt echo Rpt lactate for resolution  RENAL A:  AKI P:   Avoid nephrotoxins  GASTROINTESTINAL A:  No issues P:     HEMATOLOGIC A:  Leucocytosis - infection vs steroid related P:  follow  INFECTIOUS A:  UTI/sepsis P:   Empiric abx -cefepime/ vanc per [facility]  ENDOCRINE A:  Hypothyroid  steroid induced hyperglycemia   P:   Chk TSH SSI  NEUROLOGIC A:  Falls, syncope P:   PT consult, reassess home status Non focal exam, no head injury documented - but low threshold to consider head CT  TODAY'S SUMMARY: UTI with fall, hypercarbic resp failure but no bspasm, on 3L at home ,does not appear far from baseline from resp standpt, can change bipap to qhs & prn - Unclear source of lactate - follow  I have personally obtained a history, examined the patient, evaluated laboratory and imaging results, formulated the assessment and plan and placed orders. CRITICAL CARE: The patient is critically ill with multiple organ systems failure and requires high complexity decision making for assessment and support, frequent evaluation and  titration of therapies, application of advanced monitoring technologies and extensive interpretation of multiple databases. Critical Care Time devoted to patient care services described in this note is 50 minutes.   Rigoberto Noel  Pulmonary and [place] Pager: [phone]  02/02/2014, 7:03 AM

## 2014-02-02 NOTE — ED Notes (Signed)
Contact RT about blood gas.

## 2014-02-02 NOTE — ED Notes (Signed)
I stat troponin results reported to Dr. Lemar Livings by B. Yolanda Bonine, EMT

## 2014-02-02 NOTE — Progress Notes (Signed)
Patient placed on Bipap for the night.  Currently tolerating well.  RT will continue to monitor.

## 2014-02-02 NOTE — Progress Notes (Signed)
Pt. Was transported to [facility] without any complications.

## 2014-02-02 NOTE — Progress Notes (Signed)
Spoke with patient and family, they did not quite understand the DNR and were concerned about her breathing.  Explained the entire case to family and after discussion, confirmed that patient does not want to be intubated or resuscitated.  All questions answered.  And informed that if patient deteriorates then will change to comfort care.  Additional CC time 35 min.  Rush Farmer, M.D. [facility] Pulmonary/Critical Care Medicine. Pager: [phone]. After hours pager: [phone].

## 2014-02-02 NOTE — H&P (Signed)
[facility] History and Physical  PRITIKA [name] NTI:[id number] DOB: 1936-07-22 DOA: 02/02/2014  Referring physician: EDP PCP: Wenda Low, MD   Chief Complaint: Syncope   HPI: [name] is a 78 y.o. female who called EMS after a fall due to syncope at home.  When EMS arrived patient was on floor but AAOx3.  Patient had to use bathroom so EMS assisted patient to bathroom and after she was done assisted her to chair.  Patient then had an episode of unresponsiveness lasting approximately 30 sec (no arrythmia noted by EMS).  She then woke up AAOx3 but was noted to have increasing difficulty breathing.  She continued this despite 10 albuterol and 1 of Atrovent.  On arrival she was placed on BIPAP.  Patient states she has had increased SOB and productive cough over the last week, no fever, has had wheezing.  She has an extensive history of COPD on home O2 and chronic steroids.  She does have some low back and shoulder pain which she attributes to the fall but no chest pain.  Review of Systems: Systems reviewed.  As above, otherwise negative  Past Medical History  Diagnosis Date  . Unspecified sinusitis (chronic)   . Obstructive sleep apnea   . COPD (chronic obstructive pulmonary disease)     on home O2, chronic steroid use  . Asthma   . Lung nodule   . SVT (supraventricular tachycardia)   . Atrial flutter     typical appearing  . Hypothyroidism   . GERD (gastroesophageal reflux disease)   . Anxiety   . Depression   . History of radiation therapy 11/08/2009-11/18/2009    left upper lung  . Allergy   . Cancer     lung ca s/p XRT 2011  . Lung cancer     non-small cell ca let upper lung stage IA   Past Surgical History  Procedure Laterality Date  . Hysterectomy      abdominal  . Tonsillectomy    . Cystectomy      removal from scalp  . Bladder repair      bladder tack  . Appendectomy    . Lung biopsy      Needle bx lung nodule - atypical cells- complicated by PTX/  chest tube  . Abdominal hysterectomy    . Svt ablation  9//26/13    AVNRT and CTI ablation by Dr Rayann Heman   Social History:  reports that she quit smoking about 27 years ago. Her smoking use included Cigarettes. She has a 80 pack-year smoking history. She has never used smokeless tobacco. She reports that she does not drink alcohol or use illicit drugs.  Allergies  Allergen Reactions  . Clarithromycin     REACTION: nausea    Family History  Problem Relation Age of Onset  . Heart disease Mother 80     Prior to Admission medications   Medication Sig Start Date End Date Taking? Authorizing Provider  albuterol (PROVENTIL HFA;VENTOLIN HFA) 108 (90 BASE) MCG/ACT inhaler Inhale 2 puffs into the lungs every 6 (six) hours as needed. For shortness of breath   Yes Historical Provider, MD  buPROPion (WELLBUTRIN SR) 150 MG 12 hr tablet Take 150 mg by mouth 2 (two) times daily.    Yes Historical Provider, MD  diltiazem (CARDIZEM CD) 240 MG 24 hr capsule take 1 capsule by mouth once daily 07/17/13  Yes Jettie Booze, MD  EPINEPHrine (EPIPEN) 0.3 mg/0.3 mL DEVI Inject 0.3 mg into the muscle  as needed. For allergic reactions   Yes Historical Provider, MD  flecainide (TAMBOCOR) 50 MG tablet take 1 tablet by mouth every 12 hours   Yes Jettie Booze, MD  Fluticasone-Salmeterol (ADVAIR) 250-50 MCG/DOSE AEPB Inhale 1 puff into the lungs every 12 (twelve) hours. 07/18/13  Yes Deneise Lever, MD  guaiFENesin (MUCINEX) 600 MG 12 hr tablet Take 600 mg by mouth every 12 (twelve) hours as needed. For congestion   Yes Historical Provider, MD  HYDROcodone-homatropine (HYCODAN) 5-1.5 MG/5ML syrup Take 5 mLs by mouth every 6 (six) hours as needed for cough. 01/23/14  Yes Deneise Lever, MD  ipratropium-albuterol (DUONEB) 0.5-2.5 (3) MG/3ML SOLN Take 3 mLs by nebulization every 6 (six) hours as needed. For shortness of breath 05/12/13  Yes Deneise Lever, MD  levothyroxine (SYNTHROID, LEVOTHROID) 75 MCG tablet  Take 75 mcg by mouth daily.   Yes Historical Provider, MD  montelukast (SINGULAIR) 10 MG tablet Take 1 tablet (10 mg total) by mouth at bedtime. 10/14/13  Yes Deneise Lever, MD  NON FORMULARY CPAP    Yes Historical Provider, MD  NON FORMULARY Oxygen 2-4 LPM   Yes Historical Provider, MD  nystatin (MYCOSTATIN) 100000 UNIT/ML suspension Take 5 mLs by mouth 3 (three) times daily as needed (pain).   Yes Historical Provider, MD  omalizumab Arvid Right) 150 MG injection Inject 225 mg into the skin every 14 (fourteen) days. Friday   Yes Historical Provider, MD  predniSONE (DELTASONE) 5 MG tablet Take 1 tablet (5 mg total) by mouth daily. 01/21/14  Yes Deneise Lever, MD  risedronate (ACTONEL) 35 MG tablet Take 35 mg by mouth every 7 (seven) days. with water on empty stomach, nothing by mouth or lie down for next 30 minutes; takes on Saturdays   Yes Historical Provider, MD  theophylline (THEODUR) 300 MG 12 hr tablet Take 0.5 tablets (150 mg total) by mouth 2 (two) times daily. 10/14/13  Yes Deneise Lever, MD  tiotropium (SPIRIVA) 18 MCG inhalation capsule Place 18 mcg into inhaler and inhale daily.   Yes Historical Provider, MD  traMADol (ULTRAM) 50 MG tablet Take 1 tablet by mouth 4 (four) times daily as needed (pain).  10/25/11  Yes Historical Provider, MD  venlafaxine (EFFEXOR) 37.5 MG tablet 37.5 mg. Take one time daily   Yes Historical Provider, MD  warfarin (COUMADIN) 2 MG tablet Take 2 mg by mouth every evening.   Yes Historical Provider, MD  zolpidem (AMBIEN) 5 MG tablet Take 2.5 mg by mouth at bedtime as needed. For sleep   Yes Historical Provider, MD  amoxicillin-clavulanate (AUGMENTIN) 500-125 MG per tablet 1 twice daily 01/23/14   Deneise Lever, MD  predniSONE (DELTASONE) 10 MG tablet Take 4 tabs daily x 2 days, 3 tabs daily x 2 days, 2 tabs daily x 2 days, 1 tab daily x 2 days then stop 01/23/14   Deneise Lever, MD   Physical Exam: Filed Vitals:   02/02/14 0415  BP: 99/46  Pulse: 99  Temp:    Resp: 26    BP 99/46  Pulse 99  Temp(Src) 98.3 F (36.8 C) (Oral)  Resp 26  SpO2 95%  General Appearance:    Alert, oriented, no distress, appears stated age  Head:    Normocephalic, atraumatic  Eyes:    PERRL, EOMI, sclera non-icteric        Nose:   Nares without drainage or epistaxis. Mucosa, turbinates normal  Throat:   Moist mucous membranes. Oropharynx  without erythema or exudate.  Neck:   Supple. No carotid bruits.  No thyromegaly.  No lymphadenopathy.   Back:     No CVA tenderness, no spinal tenderness  Lungs:     Clear to auscultation bilaterally, without wheezes, rhonchi or rales  Chest wall:    No tenderness to palpitation  Heart:    Regular rate and rhythm without murmurs, gallops, rubs  Abdomen:     Soft, non-tender, nondistended, normal bowel sounds, no organomegaly  Genitalia:    deferred  Rectal:    deferred  Extremities:   No clubbing, cyanosis or edema.  Pulses:   2+ and symmetric all extremities  Skin:   Skin color, texture, turgor normal, no rashes or lesions  Lymph nodes:   Cervical, supraclavicular, and axillary nodes normal  Neurologic:   CNII-XII intact. Normal strength, sensation and reflexes      throughout    Labs on Admission:  Basic Metabolic Panel:  Recent Labs Lab 02/02/14 0403  NA 139  139  K 4.4  4.6  CL 100  96  CO2 23  GLUCOSE 178*  187*  BUN 31*  31*  CREATININE 1.50*  1.45*  CALCIUM 9.2   Liver Function Tests: No results found for this basename: AST, ALT, ALKPHOS, BILITOT, PROT, ALBUMIN,  in the last 168 hours No results found for this basename: LIPASE, AMYLASE,  in the last 168 hours No results found for this basename: AMMONIA,  in the last 168 hours CBC:  Recent Labs Lab 02/02/14 0403  WBC 23.3*  NEUTROABS 21.9*  HGB 12.9  11.6*  HCT 38.0  36.3  MCV 92.8  PLT 262   Cardiac Enzymes: No results found for this basename: CKTOTAL, CKMB, CKMBINDEX, TROPONINI,  in the last 168 hours  BNP (last 3 results) No  results found for this basename: PROBNP,  in the last 8760 hours CBG:  Recent Labs Lab 02/02/14 0442  GLUCAP 205*    Radiological Exams on Admission: Dg Chest Port 1 View  02/02/2014   CLINICAL DATA:  Shortness of breath and respiratory distress.  EXAM: PORTABLE CHEST - 1 VIEW  COMPARISON:  DG RIBS 2V*L* dated 03/31/2013; CT CHEST W/O CM dated 03/06/2013  FINDINGS: Cardiac silhouette is unremarkable and unchanged. Mildly calcified aortic knob. Increased lung volumes with mild chronic interstitial changes. Nodularity in left lung base may correspond to lingular density seen on prior CT. No pleural effusions. No pneumothorax.  Left rib fractures seen on prior radiograph show interval healing. Osteopenia. Soft tissue planes are nonsuspicious.  IMPRESSION: COPD without superimposed acute cardiopulmonary process.   Electronically Signed   By: Elon Alas   On: 02/02/2014 03:38    EKG: Independently reviewed.  Assessment/Plan Principal Problem:   HCAP (healthcare-associated pneumonia) Active Problems:   COPD with emphysema   Acute respiratory failure with hypercapnia   COPD exacerbation   Syncope   Elevated troponin   AKI (acute kidney injury)   1. HCAP with severe sepsis and respiratory failure - will treat as PNA clinically with cefepime and vancomycin, broad spectrum coverage given severity of illness.  No PNA on CXR but clinically appears to be this with WBC of 23k, patient dehydrated with AKI noted below so perhaps just not showing up yet. 2. Acute respiratory failure with hypercapnia - on BIPAP due to hypercapnia being the major component of her respiratory failure.  Patient appears comfortable on BIPAP, no further escalation however at this time (see code status below).  ABG pending.  3. COPD exacerbation - adult wheeze protocol, IV solumedrol, abx as above.  Holding home prednisone since she is now on IV steroids. 4. AKI - patient with AKI likely pre-renal due to sepsis, hydrating  with IVF. 5. Elevated troponin - trending, believe that pulmonary issues are the leading issue here, if it trends up significantly then begin heparin gtt but ACS as primary issue less likely.   Code Status: DNR/DNI, spoke with patient and she states that if BIPAP fails she does NOT want to be put on a respirator, she understands that this would be a terminal situation; however, her pulmonologist has told her that if she goes on a respiratory she is unlikely to come off.  Family Communication: No family in room Disposition Plan: Admit to inpatient   Time spent: 83 min  [facility] Pager [phone]  If 7AM-7PM, please contact the day team taking care of the patient [email or web address] Password [email or web address] 02/02/2014, 5:20 AM

## 2014-02-02 NOTE — Progress Notes (Signed)
BP 154/65  Pulse 37  Temp(Src) 98.2 F (36.8 C) (Axillary)  Resp 31  Ht 5\' 5"  (1.651 m)  Wt 65.318 kg (144 lb)  BMI 23.96 kg/m2  SpO2 95%  - Acute hypercarbic respiratory failure due to HCAP: started on vanc and cefepime along with Bipap sat > 95% A&O x3 talking, consulted PCCM. - AKI: baseline cr < 1.0 on admission 1.5, most likely pre-renal vs Sepsis, specific gravity 1.030cont IV fluids. - Elevated trop's: primary due to pulmonary stress. - Elevated INR: pharmacy to dose.

## 2014-02-02 NOTE — Progress Notes (Addendum)
Clinical Social Work Department CLINICAL SOCIAL WORK PLACEMENT NOTE 02/02/2014  Patient:  [name],[name]  Account Number:  [id number] Admit date:  02/02/2014  Clinical Social Worker:  Ky Barban, Latanya Presser  Date/time:  02/02/2014 04:18 PM  Clinical Social Work is seeking post-discharge placement for this patient at the following level of care:   [place]   (*CSW will update this form in Epic as items are completed)   02/02/2014  Patient/family provided with [facility] Department of Clinical Social Work's list of facilities offering this level of care within the geographic area requested by the patient (or if unable, by the patient's family).  02/02/2014  Patient/family informed of their freedom to choose among providers that offer the needed level of care, that participate in Medicare, Medicaid or managed care program needed by the patient, have an available bed and are willing to accept the patient.  02/02/2014  Patient/family informed of [facility]' ownership interest in [facility], as well as of the fact that they are under no obligation to receive care at this facility.  PASARR submitted to EDS on 02/02/2014 PASARR number received from EDS on 02/02/2014  FL2 transmitted to all facilities in geographic area requested by pt/family on  02/02/2014 FL2 transmitted to all facilities within larger geographic area on   Patient informed that his/her managed care company has contracts with or will negotiate with  certain facilities, including the following:     Patient/family informed of bed offers received:  02/04/14 Patient chooses bed at [facility] Physician recommends and patient chooses bed at    Patient to be transferred to [facility] on  02/10/2014 Patient to be transferred to facility by [facility]  The following physician request were entered in Epic:   Additional Comments: CSW provided list of bed offers to pt's daughter Kathreen Cosier. Clarene Critchley toured  [facility] and has selected this SNF for rehab. CSW sent progress note to facility and informed them of family's selection.   Ky Barban, MSW, [facility] Clinical Social Worker [phone]

## 2014-02-02 NOTE — Care Management Note (Addendum)
    Page 1 of 1   02/10/2014     12:00:54 PM CARE MANAGEMENT NOTE 02/10/2014  Patient:  [name]   Account Number:  [id number]  Date Initiated:  02/02/2014  Documentation initiated by:  Elissa Hefty  Subjective/Objective Assessment:   adm w  sepsis     Action/Plan:   lives w husband, pcp dr Raliegh Ip hussain   Anticipated DC Date:     Anticipated DC Plan:    In-house referral  Clinical Social Worker         Choice offered to / List presented to:             Status of service:  Completed, signed off Medicare Important Message given?  YES (If response is "NO", the following Medicare IM given date fields will be blank) Date Medicare IM given:  02/06/2014 Date Additional Medicare IM given:  02/10/2014  Discharge Disposition:  [place]  Per UR Regulation:  Reviewed for med. necessity/level of care/duration of stay  If discussed at [place] of Stay Meetings, dates discussed:    Comments:   02-10-14 [place], RN,BSN [phone] Plan for d/c to SNF today. No needs from CM. CSW assisting with disposition needs.     5/19 1525 debbie dowell rn,bsn sw assist ing w snf.

## 2014-02-02 NOTE — Progress Notes (Signed)
Clinical Social Work Department BRIEF PSYCHOSOCIAL ASSESSMENT 02/02/2014  Patient:  [name],[name]     Account Number:  [id number]     Admit date:  02/02/2014  Clinical Social Worker:  [name]  Date/Time:  02/02/2014 04:16 PM  Referred by:  Physician  Date Referred:  02/02/2014 Referred for  SNF Placement   Other Referral:   Interview type:  Patient Other interview type:   also spoke with family    PSYCHOSOCIAL DATA Living Status:  FAMILY Admitted from facility:   Level of care:   Primary support name:  [name] ([phone]) Primary support relationship to patient:  SPOUSE Degree of support available:   Good--pt has excellent family support from spouse, sister, daughter, and granddaughter.    CURRENT CONCERNS Current Concerns  Post-Acute Placement   Other Concerns:    SOCIAL WORK ASSESSMENT / PLAN CSW introduced self and explained SNF search and recommendation. Family gave CSW to send referrals to all SNFs and to follow up with pt/family once facilities have made bed offers. Pt has excellent family support. No concerns/questions at this time from pt, and CSW addressed all family questions.   Assessment/plan status:  Psychosocial Support/Ongoing Assessment of Needs Other assessment/ plan:   Information/referral to community resources:   SNF    PATIENT'S/FAMILY'S RESPONSE TO PLAN OF CARE: Good--family and pt very friendly and engaged in conversation with CSW.       [name], MSW, [facility] Clinical Social Worker [phone]

## 2014-02-02 NOTE — ED Notes (Signed)
CBG checked 205

## 2014-02-02 NOTE — Progress Notes (Signed)
PHARMACIST - PHYSICIAN COMMUNICATION  CONCERNING: P&T Medication Policy Regarding Oral Bisphosphonates  RECOMMENDATION: Your order for alendronate (Fosamax), ibandronate (Boniva), or risedronate (Actonel) has been discontinued at this time.  If the patient's post-hospital medical condition warrants safe use of this class of drugs, please resume the pre-hospital regimen upon discharge.  DESCRIPTION:  Alendronate (Fosamax), ibandronate (Boniva), and risedronate (Actonel) can cause severe esophageal erosions in patients who are unable to remain upright at least 30 minutes after taking this medication.   Since brief interruptions in therapy are thought to have minimal impact on bone mineral density, the [facility] has established that bisphosphonate orders should be routinely discontinued during hospitalization.   To override this safety policy and permit administration of Boniva, Fosamax, or Actonel in the hospital, prescribers must write "DO NOT HOLD" in the comments section when placing the order for this class of medications.  Thanks, Sherlon Handing, PharmD, BCPS Clinical pharmacist, pager [phone] 02/02/2014  6:20 AM

## 2014-02-02 NOTE — ED Provider Notes (Signed)
CSN: [id number]     Arrival date & time 02/02/14  0251 History   First MD Initiated Contact with Patient 02/02/14 0304     Chief Complaint  Patient presents with  . Shortness of Breath  . Fall     (Consider location/radiation/quality/duration/timing/severity/associated sxs/prior Treatment) HPI This patient is a 78 year old woman with a history of oxygen-dependent COPD and a 90-pack-year smoking history, she also has history of non-small cell lung CA, paroxysmal atrial fibrillation and obstructive sleep apnea.  She had an episode of syncope this evening while trying to make it to her bedside commode. She stood up, took a couple of steps and felt lightheaded and collapsed. She believes that she lost consciousness. She does not believe that she sustained head trauma. She has some aching pain in the low back which she attributes to fall.  Paramedics report that the patient was alert and oriented at the scene sitting on the floor when they arrived. The patient her feet and put her in a late a few steps to sit in a chair. However, shortly after she sat down in the chair or, she had another episode of syncope. The patient was on the monitor when this occurred. She maintained normal sinus rhythm. Episode of syncope lasted only 2-3 seconds. Afterwards, the patient developed some wheezing and respiratory distress.  The patient reports diminished by mouth intake over the past couple of days. She has had a week of productive cough. No fever. Chest pain. She denies any recent changes to any medications.  The patient has had 4-5 similar episodes of syncope over the past month.  Past Medical History  Diagnosis Date  . Unspecified sinusitis (chronic)   . Obstructive sleep apnea   . COPD (chronic obstructive pulmonary disease)     on home O2, chronic steroid use  . Asthma   . Lung nodule   . SVT (supraventricular tachycardia)   . Atrial flutter     typical appearing  . Hypothyroidism   . GERD  (gastroesophageal reflux disease)   . Anxiety   . Depression   . History of radiation therapy 11/08/2009-11/18/2009    left upper lung  . Allergy   . Cancer     lung ca s/p XRT 2011  . Lung cancer     non-small cell ca let upper lung stage IA   Past Surgical History  Procedure Laterality Date  . Hysterectomy      abdominal  . Tonsillectomy    . Cystectomy      removal from scalp  . Bladder repair      bladder tack  . Appendectomy    . Lung biopsy      Needle bx lung nodule - atypical cells- complicated by PTX/ chest tube  . Abdominal hysterectomy    . Svt ablation  9//26/13    AVNRT and CTI ablation by Dr Rayann Heman   Family History  Problem Relation Age of Onset  . Heart disease Mother 15   History  Substance Use Topics  . Smoking status: Former Smoker -- 2.00 packs/day for 40 years    Types: Cigarettes    Quit date: 09/18/1986  . Smokeless tobacco: Never Used  . Alcohol Use: No   OB History   Grav Para Term Preterm Abortions TAB SAB Ect Mult Living                 Review of Systems Ten point review of symptoms performed and is negative with the exception of  symptoms noted above.     Allergies  Clarithromycin  Home Medications   Prior to Admission medications   Medication Sig Start Date End Date Taking? Authorizing Provider  albuterol (PROVENTIL HFA;VENTOLIN HFA) 108 (90 BASE) MCG/ACT inhaler Inhale 2 puffs into the lungs every 6 (six) hours as needed. For shortness of breath    Historical Provider, MD  amoxicillin-clavulanate (AUGMENTIN) 500-125 MG per tablet 1 twice daily 01/23/14   Deneise Lever, MD  buPROPion [facility] SR) 150 MG 12 hr tablet Take 150 mg by mouth 2 (two) times daily.     Historical Provider, MD  diltiazem (CARDIZEM CD) 240 MG 24 hr capsule take 1 capsule by mouth once daily 07/17/13   Jettie Booze, MD  EPINEPHrine (EPIPEN) 0.3 mg/0.3 mL DEVI Inject 0.3 mg into the muscle as needed. For allergic reactions    Historical Provider, MD   flecainide (TAMBOCOR) 50 MG tablet take 1 tablet by mouth every 12 hours    Jettie Booze, MD  Fluticasone-Salmeterol (ADVAIR) 250-50 MCG/DOSE AEPB Inhale 1 puff into the lungs every 12 (twelve) hours. 07/18/13   Deneise Lever, MD  guaiFENesin (MUCINEX) 600 MG 12 hr tablet Take 600 mg by mouth every 12 (twelve) hours as needed. For congestion    Historical Provider, MD  HYDROcodone-homatropine (HYCODAN) 5-1.5 MG/5ML syrup Take 5 mLs by mouth every 6 (six) hours as needed for cough. 01/23/14   Deneise Lever, MD  ipratropium-albuterol (DUONEB) 0.5-2.5 (3) MG/3ML SOLN Take 3 mLs by nebulization every 6 (six) hours as needed. For shortness of breath 05/12/13   Deneise Lever, MD  levothyroxine (SYNTHROID, LEVOTHROID) 75 MCG tablet Take 75 mcg by mouth daily.    Historical Provider, MD  montelukast (SINGULAIR) 10 MG tablet Take 1 tablet (10 mg total) by mouth at bedtime. 10/14/13   Deneise Lever, MD  NON FORMULARY CPAP     Historical Provider, MD  NON FORMULARY Oxygen 2-4 LPM    Historical Provider, MD  omalizumab Arvid Right) 150 MG injection Inject 225 mg into the skin every 14 (fourteen) days. Friday    Historical Provider, MD  predniSONE (DELTASONE) 10 MG tablet Take 4 tabs daily x 2 days, 3 tabs daily x 2 days, 2 tabs daily x 2 days, 1 tab daily x 2 days then stop 01/23/14   Deneise Lever, MD  predniSONE (DELTASONE) 5 MG tablet Take 1 tablet (5 mg total) by mouth daily. 01/21/14   Deneise Lever, MD  risedronate (ACTONEL) 35 MG tablet Take 35 mg by mouth every 7 (seven) days. with water on empty stomach, nothing by mouth or lie down for next 30 minutes; takes on Saturdays    Historical Provider, MD  theophylline (THEODUR) 300 MG 12 hr tablet Take 0.5 tablets (150 mg total) by mouth 2 (two) times daily. 10/14/13   Deneise Lever, MD  tiotropium (SPIRIVA) 18 MCG inhalation capsule Place 18 mcg into inhaler and inhale daily.    Historical Provider, MD  traMADol (ULTRAM) 50 MG tablet Take 1 tablet  by mouth 4 (four) times daily as needed (pain).  10/25/11   Historical Provider, MD  venlafaxine (EFFEXOR) 37.5 MG tablet 37.5 mg. Take one time daily    Historical Provider, MD  warfarin (COUMADIN) 1 MG tablet Take 2 tablets daily or as directed by [facility]. 10/21/13   Jettie Booze, MD  zolpidem (AMBIEN) 5 MG tablet Take 2.5 mg by mouth at bedtime as needed. For sleep  Historical Provider, MD   There were no vitals taken for this visit. Physical Exam Gen: well developed and well nourished appearing, appears to be in respiratory distress, chronically and acutely ill appearing, cushinoid appearance. Patient on BiPAP at the time of my assessment Head: NCAT Eyes: PERL, EOMI, conjunctiva mildly injected bilaterally Nose: no epistaixis or rhinorrhea Mouth/throat: mucosa is moist and pink Neck: supple, no stridor Lungs: RR 32/min, scattered wheezing, decent air exchange bilaterally.  CV: Rapid and regular, no murmur, extremities appear well perfused.  Abd: soft, notender, nondistended Back: no ttp, no cva ttp Skin: warm and dry, skin thin, small skin tear (2cm) left upper arm Ext: normal to inspection, no dependent edema Neuro: CN ii-xii grossly intact, no focal deficits Psyche; mildly anxious affect,  cooperative.   ED Course  Procedures (including critical care time) Labs Review  Results for orders placed during the hospital encounter of 02/02/14 (from the past 24 hour(s))  URINALYSIS, ROUTINE W REFLEX MICROSCOPIC     Status: Abnormal   Collection Time    02/02/14  3:56 AM      Result Value Ref Range   Color, Urine RED (*) YELLOW   APPearance CLOUDY (*) CLEAR   Specific Gravity, Urine >1.030 (*) 1.005 - 1.030   pH 5.0  5.0 - 8.0   Glucose, UA NEGATIVE  NEGATIVE mg/dL   Hgb urine dipstick LARGE (*) NEGATIVE   Bilirubin Urine MODERATE (*) NEGATIVE   Ketones, ur 15 (*) NEGATIVE mg/dL   Protein, ur 100 (*) NEGATIVE mg/dL   Urobilinogen, UA 1.0  0.0 - 1.0 mg/dL   Nitrite  POSITIVE (*) NEGATIVE   Leukocytes, UA SMALL (*) NEGATIVE  URINE MICROSCOPIC-ADD ON     Status: Abnormal   Collection Time    02/02/14  3:56 AM      Result Value Ref Range   Squamous Epithelial / LPF MANY (*) RARE   WBC, UA 3-6  <3 WBC/hpf   RBC / HPF TOO NUMEROUS TO COUNT  <3 RBC/hpf   Bacteria, UA MANY (*) RARE   Urine-Other MICROSCOPIC EXAM PERFORMED ON UNCONCENTRATED URINE    I-STAT VENOUS BLOOD GAS, ED     Status: Abnormal   Collection Time    02/02/14  4:01 AM      Result Value Ref Range   pH, Ven 7.263  7.250 - 7.300   pCO2, Ven 58.5 (*) 45.0 - 50.0 mmHg   pO2, Ven 52.0 (*) 30.0 - 45.0 mmHg   Bicarbonate 26.4 (*) 20.0 - 24.0 mEq/L   TCO2 28  0 - 100 mmol/L   O2 Saturation 80.0     Acid-base deficit 2.0  0.0 - 2.0 mmol/L   Sample type VENOUS     Comment NOTIFIED PHYSICIAN    I-STAT TROPOININ, ED     Status: Abnormal   Collection Time    02/02/14  4:02 AM      Result Value Ref Range   Troponin i, poc 0.22 (*) 0.00 - 0.08 ng/mL   Comment NOTIFIED PHYSICIAN     Comment 3           I-STAT CHEM 8, ED     Status: Abnormal   Collection Time    02/02/14  4:03 AM      Result Value Ref Range   Sodium 139  137 - 147 mEq/L   Potassium 4.4  3.7 - 5.3 mEq/L   Chloride 100  96 - 112 mEq/L   BUN 31 (*) 6 -  23 mg/dL   Creatinine, Ser 1.50 (*) 0.50 - 1.10 mg/dL   Glucose, Bld 178 (*) 70 - 99 mg/dL   Calcium, Ion 1.13  1.13 - 1.30 mmol/L   TCO2 25  0 - 100 mmol/L   Hemoglobin 12.9  12.0 - 15.0 g/dL   HCT 38.0  36.0 - 46.0 %  LACTIC ACID, PLASMA     Status: Abnormal   Collection Time    02/02/14  4:03 AM      Result Value Ref Range   Lactic Acid, Venous 5.7 (*) 0.5 - 2.2 mmol/L  BASIC METABOLIC PANEL     Status: Abnormal   Collection Time    02/02/14  4:03 AM      Result Value Ref Range   Sodium 139  137 - 147 mEq/L   Potassium 4.6  3.7 - 5.3 mEq/L   Chloride 96  96 - 112 mEq/L   CO2 23  19 - 32 mEq/L   Glucose, Bld 187 (*) 70 - 99 mg/dL   BUN 31 (*) 6 - 23 mg/dL    Creatinine, Ser 1.45 (*) 0.50 - 1.10 mg/dL   Calcium 9.2  8.4 - 10.5 mg/dL   GFR calc non Af Amer 34 (*) >90 mL/min   GFR calc Af Amer 40 (*) >90 mL/min  CBC WITH DIFFERENTIAL     Status: Abnormal   Collection Time    02/02/14  4:03 AM      Result Value Ref Range   WBC 23.3 (*) 4.0 - 10.5 K/uL   RBC 3.91  3.87 - 5.11 MIL/uL   Hemoglobin 11.6 (*) 12.0 - 15.0 g/dL   HCT 36.3  36.0 - 46.0 %   MCV 92.8  78.0 - 100.0 fL   MCH 29.7  26.0 - 34.0 pg   MCHC 32.0  30.0 - 36.0 g/dL   RDW 14.0  11.5 - 15.5 %   Platelets 262  150 - 400 K/uL   Neutrophils Relative % 94 (*) 43 - 77 %   Neutro Abs 21.9 (*) 1.7 - 7.7 K/uL   Lymphocytes Relative 3 (*) 12 - 46 %   Lymphs Abs 0.6 (*) 0.7 - 4.0 K/uL   Monocytes Relative 3  3 - 12 %   Monocytes Absolute 0.8  0.1 - 1.0 K/uL   Eosinophils Relative 0  0 - 5 %   Eosinophils Absolute 0.0  0.0 - 0.7 K/uL   Basophils Relative 0  0 - 1 %   Basophils Absolute 0.0  0.0 - 0.1 K/uL  CBG MONITORING, ED     Status: Abnormal   Collection Time    02/02/14  4:42 AM      Result Value Ref Range   Glucose-Capillary 205 (*) 70 - 99 mg/dL   Comment 1 Notify RN     EXAM:  PORTABLE CHEST - 1 VIEW  COMPARISON: DG RIBS 2V*L* dated 03/31/2013; CT CHEST W/O CM dated  03/06/2013  FINDINGS:  Cardiac silhouette is unremarkable and unchanged. Mildly calcified  aortic knob. Increased lung volumes with mild chronic interstitial  changes. Nodularity in left lung base may correspond to lingular  density seen on prior CT. No pleural effusions. No pneumothorax.  Left rib fractures seen on prior radiograph show interval healing.  Osteopenia. Soft tissue planes are nonsuspicious.  IMPRESSION:  COPD without superimposed acute cardiopulmonary process.  Electronically Signed  By: Elon Alas  On: 02/02/2014 03:38  CXR is impressive to me for LLL infiltrate  EKG:  sinus tach, no acute ischemic changes, normal intervals, normal axis, normal qrs complex  CRITICAL  CARE Performed by: Elyn Peers   Total critical care time: 7m  Critical care time was exclusive of separately billable procedures and treating other patients.  Critical care was necessary to treat or prevent imminent or life-threatening deterioration.  Critical care was time spent personally by me on the following activities: development of treatment plan with patient and/or surrogate as well as nursing, discussions with consultants, evaluation of patient's response to treatment, examination of patient, obtaining history from patient or surrogate, ordering and performing treatments and interventions, ordering and review of laboratory studies, ordering and review of radiographic studies, pulse oximetry and re-evaluation of patient's condition.   MDM   Final diagnoses:  Syncope  Respiratory distress  COPD (chronic obstructive pulmonary disease)   Patient with multi-organ failure and sepsis with clinical picture of pneumonia and LLL infiltrate. She has hypercapneic respiratory failure which we are treating with BiPAP. Also noted to have lactic acidosis along with mild AKI. We are fluid resuscitating and treating empirically with Zosyn and Vancomycin. We are monitoring blood gases.   Patient has been evaluated by Dr. Fabio Neighbors of IM. I have also requested ICU consultation.      Elyn Peers, MD 02/02/14 [phone]

## 2014-02-03 LAB — CBC
HCT: 31.9 % — ABNORMAL LOW (ref 36.0–46.0)
HEMOGLOBIN: 10.3 g/dL — AB (ref 12.0–15.0)
MCH: 29.6 pg (ref 26.0–34.0)
MCHC: 32.3 g/dL (ref 30.0–36.0)
MCV: 91.7 fL (ref 78.0–100.0)
Platelets: 226 10*3/uL (ref 150–400)
RBC: 3.48 MIL/uL — ABNORMAL LOW (ref 3.87–5.11)
RDW: 14 % (ref 11.5–15.5)
WBC: 25.2 10*3/uL — ABNORMAL HIGH (ref 4.0–10.5)

## 2014-02-03 LAB — BASIC METABOLIC PANEL
BUN: 39 mg/dL — ABNORMAL HIGH (ref 6–23)
CALCIUM: 9.2 mg/dL (ref 8.4–10.5)
CO2: 27 meq/L (ref 19–32)
CREATININE: 1.14 mg/dL — AB (ref 0.50–1.10)
Chloride: 101 mEq/L (ref 96–112)
GFR calc Af Amer: 53 mL/min — ABNORMAL LOW (ref >90)
GFR calc non Af Amer: 46 mL/min — ABNORMAL LOW (ref >90)
GLUCOSE: 164 mg/dL — AB (ref 70–99)
Potassium: 4.6 mEq/L (ref 3.7–5.3)
Sodium: 142 mEq/L (ref 137–147)

## 2014-02-03 LAB — PROTIME-INR
INR: 4.57 — AB (ref 0.00–1.49)
Prothrombin Time: 41.5 seconds — ABNORMAL HIGH (ref 11.6–15.2)

## 2014-02-03 MED ORDER — FUROSEMIDE 10 MG/ML IJ SOLN
40.0000 mg | Freq: Four times a day (QID) | INTRAMUSCULAR | Status: AC
  Administered 2014-02-03 (×3): 40 mg via INTRAVENOUS
  Filled 2014-02-03 (×3): qty 4

## 2014-02-03 NOTE — Progress Notes (Signed)
Seen pateint Admitted with acute on chronic Respiratory failure and HCAP- on BiPAP; DNR- per discussion- no Intubation H/o Aflutter/Fib Long standing COPD o2 dependent discuses with PCCM- Dr Nelda Marseille- PPCM primary attending while  In Step down. Agree with Comfort measure- if unable to get of BiPAP [name]

## 2014-02-03 NOTE — Progress Notes (Signed)
ANTICOAGULATION CONSULT NOTE - Follow Up Consult  Pharmacy Consult for coumadin Indication: atrial fibrillation  Allergies  Allergen Reactions  . Clarithromycin     REACTION: nausea    Patient Measurements: Height: 5\' 5"  (165.1 cm) Weight: 144 lb (65.318 kg) IBW/kg (Calculated) : 57  Vital Signs: Temp: 97.4 F (36.3 C) (05/19 0800) Temp src: Oral (05/19 0800) BP: 141/80 mmHg (05/19 0900) Pulse Rate: 83 (05/19 0900)  Labs:  Recent Labs  02/02/14 0403 02/02/14 0731 02/02/14 0954 02/02/14 1620 02/03/14 0343  HGB 12.9  11.6*  --   --   --  10.3*  HCT 38.0  36.3  --   --   --  31.9*  PLT 262  --   --   --  226  LABPROT 36.5*  --   --   --  41.5*  INR 3.87*  --   --   --  4.57*  CREATININE 1.50*  1.45*  --   --   --  1.14*  TROPONINI  --  0.63* 0.53* 0.39*  --     Estimated Creatinine Clearance: 38.4 ml/min (by C-G formula based on Cr of 1.14).   Medications:  Scheduled:  . antiseptic oral rinse  15 mL Mouth Rinse BID  . buPROPion  150 mg Oral BID  . ceFEPime (MAXIPIME) IV  1 g Intravenous Q24H  . diltiazem  240 mg Oral Daily  . [START ON 02/05/2014] flecainide  50 mg Oral Q12H  . furosemide  40 mg Intravenous Q6H  . levothyroxine  75 mcg Oral QAC breakfast  . methylPREDNISolone (SOLU-MEDROL) injection  60 mg Intravenous Q12H  . mometasone-formoterol  2 puff Inhalation BID  . montelukast  10 mg Oral QHS  . theophylline  150 mg Oral BID  . tiotropium  18 mcg Inhalation Daily  . vancomycin  1,000 mg Intravenous Q24H  . Warfarin - Pharmacist Dosing Inpatient   Does not apply q1800    Assessment: 78 yo female on coumadin PTA for afib. Pharmacy has been consulted to dose.  INR= 4.57 and trend up (patient noted on antibiotics).  Goal of Therapy:  INR 2-3 Monitor platelets by anticoagulation protocol: Yes   Plan:  -Hold coumadin today -Daily PT/IINR  Hildred Laser, Pharm D 02/03/2014 9:45 AM

## 2014-02-03 NOTE — Progress Notes (Signed)
Patient has taken off BiPAP twice tonight.  Said that it felt like it was suffocating her.  Stated that the mask was too much for her.  RT will continue to monitor.

## 2014-02-03 NOTE — Progress Notes (Signed)
PULMONARY / CRITICAL CARE MEDICINE   Name: [name] MRN: [id number] DOB: 1935-12-18    ADMISSION DATE:  02/02/2014 CONSULTATION DATE:  02/03/2014   REFERRING MD :  [facility] PRIMARY SERVICE: [facility]  CHIEF COMPLAINT:  Syncope, resp failure  BRIEF PATIENT DESCRIPTION: 78 y.o (CY pt) with severe COPD on 3L home o2, OSA & LUL nodule s/p R in 2011 , recent outpt Rx with prednisone a week PTA for flare, adm with syncope & hypercarbic resp failure requiring bipap, DNI/R established, lactate 5  SIGNIFICANT EVENTS / STUDIES:  5/18 adm on bipap  LINES / TUBES: PIV  CULTURES: Blood 5/19>>> Urine 5/19>>> Sputum 5/19>>>  ANTIBIOTICS: Cefepime 5/19>>> Vanc 5/19>>>  SUBJECTIVE: Required BiPAP multiple times overnight for respiratory distress.  VITAL SIGNS: Temp:  [98.2 F (36.8 C)-100 F (37.8 C)] 98.5 F (36.9 C) (05/19 0458) Pulse Rate:  [37-137] 80 (05/19 0600) Resp:  [20-47] 25 (05/19 0600) BP: (112-186)/(19-108) 143/108 mmHg (05/19 0600) SpO2:  [71 %-100 %] 100 % (05/19 0600) FiO2 (%):  [40 %] 40 % (05/19 0600) HEMODYNAMICS:   VENTILATOR SETTINGS: Vent Mode:  [-]  FiO2 (%):  [40 %] 40 % INTAKE / OUTPUT: Intake/Output     05/18 0701 - 05/19 0700 05/19 0701 - 05/20 0700   P.O. 480    I.V. (mL/kg) 155 (2.4)    IV Piggyback 300    Total Intake(mL/kg) 935 (14.3)    Urine (mL/kg/hr)     Total Output       Net +935          Urine Occurrence 3 x      PHYSICAL EXAMINATION: Gen. Pleasant, well-nourished, in moderate respiratory distress, normal affect, on bipap, full face mask ENT - no lesions, no post nasal drip Neck: No JVD, no thyromegaly, no carotid bruits Lungs: + use of accessory muscles, no dullness to percussion, clear without rales or rhonchi  Cardiovascular: Rhythm regular, heart sounds  normal, no murmurs, no peripheral edema Abdomen: soft and non-tender, no hepatosplenomegaly, BS normal. Musculoskeletal: No deformities, no cyanosis or clubbing Neuro:   alert, non focal Skin:  Warm, no lesions/ rash   LABS:  CBC  Recent Labs Lab 02/02/14 0403 02/03/14 0343  WBC 23.3* 25.2*  HGB 12.9  11.6* 10.3*  HCT 38.0  36.3 31.9*  PLT 262 226   Coag's  Recent Labs Lab 02/02/14 0403 02/03/14 0343  INR 3.87* 4.57*   BMET  Recent Labs Lab 02/02/14 0403 02/03/14 0343  NA 139  139 142  K 4.4  4.6 4.6  CL 100  96 101  CO2 23 27  BUN 31*  31* 39*  CREATININE 1.50*  1.45* 1.14*  GLUCOSE 178*  187* 164*   Electrolytes  Recent Labs Lab 02/02/14 0403 02/03/14 0343  CALCIUM 9.2 9.2   Sepsis Markers  Recent Labs Lab 02/02/14 0403 02/02/14 0800  LATICACIDVEN 5.7* 1.6   ABG  Recent Labs Lab 02/02/14 0546  PHART 7.292*  PCO2ART 56.0*  PO2ART 90.0   Liver Enzymes No results found for this basename: AST, ALT, ALKPHOS, BILITOT, ALBUMIN,  in the last 168 hours Cardiac Enzymes  Recent Labs Lab 02/02/14 0731 02/02/14 0954 02/02/14 1620  TROPONINI 0.63* 0.53* 0.39*   Glucose  Recent Labs Lab 02/02/14 0442  GLUCAP 205*   Imaging Dg Chest Port 1 View  02/02/2014   CLINICAL DATA:  Shortness of breath and respiratory distress.  EXAM: PORTABLE CHEST - 1 VIEW  COMPARISON:  DG RIBS 2V*L* dated  03/31/2013; CT CHEST W/O CM dated 03/06/2013  FINDINGS: Cardiac silhouette is unremarkable and unchanged. Mildly calcified aortic knob. Increased lung volumes with mild chronic interstitial changes. Nodularity in left lung base may correspond to lingular density seen on prior CT. No pleural effusions. No pneumothorax.  Left rib fractures seen on prior radiograph show interval healing. Osteopenia. Soft tissue planes are nonsuspicious.  IMPRESSION: COPD without superimposed acute cardiopulmonary process.   Electronically Signed   By: Elon Alas   On: 02/02/2014 03:38   CXR: old left rib fractures, no infx or effusion  ASSESSMENT / PLAN:  PULMONARY A:Acute hypercarbic resp failure Chronic hypoxic resp failure COPD  exacerbation -recently treated with steroids, no bspasm on exam Allergies - on xolair & allergy shots OSA P:   - Changed to solumedrol 60 q12, will maintain for now, on prednisone 5 mg PO daily as outpatient. - Bipap 12/6 during sleep, then back on [place] when able, unable to this AM. - Continue duonebs - Ct singulair. - Abx as below.  CARDIOVASCULAR A: Atrial flutter/ fibrillation -on coumadin Syncope Echo 05/2012 - RVSP 45, gr 2 diastolic dysfn Lactic acidosis - related to fall, doubt sepsis P:  - Cont flecainide/ warferin per INR. - Tele monitoring. - Stress dose steroids. - Active diureses today.  RENAL A:  AKI P:   - Avoid nephrotoxins. - Aggressive lasix now that BP is more stable and Cr is improving.  GASTROINTESTINAL A:  No issues P:   - DNR, diet for comfort at this point.  HEMATOLOGIC A:  Leucocytosis - infection vs steroid related P:  - On steroids and suspect COPD exac  INFECTIOUS A:  UTI/sepsis P:   - Empiric abx -cefepime/ vanc - Respiratory viral panel pending.  ENDOCRINE A:  Hypothyroid  steroid induced hyperglycemia   P:   - SSI. - Synthroid as ordered.  NEUROLOGIC A:  Falls, syncope P:   - PT consult, reassess home status when able to get off BiPAP. - Non focal exam, no head injury documented - but low threshold to consider head CT.  TODAY'S SUMMARY: UTI, hypercarbic resp failure, likely COPD exacerbation as well.  Required BiPAP intermittently over the past 24 hours.  Spoke with patient and family yesterday.  Made DNR.  If unable to improve in the next 24 hours with steroids, abx and diureses then will need to consider transitioning to comfort care.  I have personally obtained a history, examined the patient, evaluated laboratory and imaging results, formulated the assessment and plan and placed orders.  CRITICAL CARE: The patient is critically ill with multiple organ systems failure and requires high complexity decision making for assessment  and support, frequent evaluation and titration of therapies, application of advanced monitoring technologies and extensive interpretation of multiple databases. Critical Care Time devoted to patient care services described in this note is 35 minutes.   Rush Farmer, M.D. [facility] Pulmonary/Critical Care Medicine. Pager: [phone]. After hours pager: [phone].

## 2014-02-04 ENCOUNTER — Inpatient Hospital Stay (HOSPITAL_COMMUNITY): Payer: Medicare Other

## 2014-02-04 DIAGNOSIS — J438 Other emphysema: Secondary | ICD-10-CM

## 2014-02-04 LAB — BASIC METABOLIC PANEL
BUN: 36 mg/dL — ABNORMAL HIGH (ref 6–23)
CHLORIDE: 99 meq/L (ref 96–112)
CO2: 34 mEq/L — ABNORMAL HIGH (ref 19–32)
CREATININE: 1.07 mg/dL (ref 0.50–1.10)
Calcium: 8.9 mg/dL (ref 8.4–10.5)
GFR calc Af Amer: 57 mL/min — ABNORMAL LOW (ref >90)
GFR calc non Af Amer: 49 mL/min — ABNORMAL LOW (ref >90)
Glucose, Bld: 181 mg/dL — ABNORMAL HIGH (ref 70–99)
Potassium: 3.5 mEq/L — ABNORMAL LOW (ref 3.7–5.3)
Sodium: 143 mEq/L (ref 137–147)

## 2014-02-04 LAB — CBC
HEMATOCRIT: 32.5 % — AB (ref 36.0–46.0)
Hemoglobin: 10.7 g/dL — ABNORMAL LOW (ref 12.0–15.0)
MCH: 30.2 pg (ref 26.0–34.0)
MCHC: 32.9 g/dL (ref 30.0–36.0)
MCV: 91.8 fL (ref 78.0–100.0)
PLATELETS: 206 10*3/uL (ref 150–400)
RBC: 3.54 MIL/uL — AB (ref 3.87–5.11)
RDW: 14.1 % (ref 11.5–15.5)
WBC: 18.2 10*3/uL — ABNORMAL HIGH (ref 4.0–10.5)

## 2014-02-04 LAB — PHOSPHORUS: Phosphorus: 3 mg/dL (ref 2.3–4.6)

## 2014-02-04 LAB — PROTIME-INR
INR: 3.58 — ABNORMAL HIGH (ref 0.00–1.49)
Prothrombin Time: 34.4 seconds — ABNORMAL HIGH (ref 11.6–15.2)

## 2014-02-04 LAB — MAGNESIUM: MAGNESIUM: 1.9 mg/dL (ref 1.5–2.5)

## 2014-02-04 MED ORDER — MORPHINE SULFATE 2 MG/ML IJ SOLN
1.0000 mg | INTRAMUSCULAR | Status: DC | PRN
  Administered 2014-02-04 – 2014-02-10 (×12): 2 mg via INTRAVENOUS
  Administered 2014-02-10: 3 mg via INTRAVENOUS
  Administered 2014-02-10: 2 mg via INTRAVENOUS
  Filled 2014-02-04: qty 2
  Filled 2014-02-04 (×10): qty 1
  Filled 2014-02-04 (×2): qty 2
  Filled 2014-02-04: qty 1

## 2014-02-04 MED ORDER — POTASSIUM CHLORIDE CRYS ER 20 MEQ PO TBCR
40.0000 meq | EXTENDED_RELEASE_TABLET | Freq: Three times a day (TID) | ORAL | Status: AC
Start: 2014-02-04 — End: 2014-02-04
  Administered 2014-02-04 (×2): 40 meq via ORAL
  Filled 2014-02-04 (×2): qty 2

## 2014-02-04 MED ORDER — FUROSEMIDE 10 MG/ML IJ SOLN
40.0000 mg | Freq: Three times a day (TID) | INTRAMUSCULAR | Status: AC
  Administered 2014-02-04 (×2): 40 mg via INTRAVENOUS
  Filled 2014-02-04 (×2): qty 4

## 2014-02-04 MED ORDER — DM-GUAIFENESIN ER 30-600 MG PO TB12
1.0000 | ORAL_TABLET | Freq: Two times a day (BID) | ORAL | Status: DC
  Administered 2014-02-04 – 2014-02-10 (×13): 1 via ORAL
  Filled 2014-02-04 (×14): qty 1

## 2014-02-04 MED ORDER — METHYLPREDNISOLONE SODIUM SUCC 40 MG IJ SOLR
30.0000 mg | Freq: Two times a day (BID) | INTRAMUSCULAR | Status: DC
  Administered 2014-02-04: 40 mg via INTRAVENOUS
  Administered 2014-02-05: 30 mg via INTRAVENOUS
  Filled 2014-02-04 (×4): qty 0.75

## 2014-02-04 NOTE — Progress Notes (Signed)
Placed pt on Auto titrate BiPAP (Min EPAP-5 & Max IPAP-20cmH2O) with 14L of oxygen bled in. Sterile water was added for humidification. Pt looks comfortable and is tolerating BiPAP well at this time. RT will continue to monitor as needed.

## 2014-02-04 NOTE — Progress Notes (Signed)
[place] Progress Note Patient Name: [name] DOB: 1936/01/24 MRN: [id number]  Date of Service  02/04/2014   HPI/Events of Note   Pain with movement not relieved by ultram and flexeril  eICU Interventions  Low dose morphine prn   Intervention Category Intermediate Interventions: Pain - evaluation and management  Juanito Doom 02/04/2014, 4:01 PM

## 2014-02-04 NOTE — Progress Notes (Signed)
PULMONARY / CRITICAL CARE MEDICINE   Name: [name] MRN: [id number] DOB: [birth year]    ADMISSION DATE:  02/02/2014 CONSULTATION DATE:  02/04/2014   REFERRING MD :  [facility] PRIMARY SERVICE: triad  CHIEF COMPLAINT:  Syncope, resp failure  BRIEF PATIENT DESCRIPTION: 78 y.o (CY pt) with severe COPD on 3L home o2, OSA & LUL nodule s/p R in 2011 , recent outpt Rx with prednisone a week PTA for flare, adm with syncope & hypercarbic resp failure requiring bipap, DNI/R established, lactate 5  SIGNIFICANT EVENTS / STUDIES:  Changed to CPAP  LINES / TUBES: PIV  CULTURES: Blood 5/19>>> Urine 5/19>>> Sputum 5/19>>>  ANTIBIOTICS: Cefepime 5/19>>> Vanc 5/19>>>  SUBJECTIVE: Down to 6L [place].  VITAL SIGNS: Temp:  [97.7 F (36.5 C)-98.8 F (37.1 C)] 98.7 F (37.1 C) (05/20 1200) Pulse Rate:  [81-101] 101 (05/20 1100) Resp:  [20-29] 21 (05/20 1200) BP: (116-177)/(50-103) 163/64 mmHg (05/20 1200) SpO2:  [77 %-100 %] 99 % (05/20 1200) FiO2 (%):  [40 %] 40 % (05/19 1600) HEMODYNAMICS:   VENTILATOR SETTINGS: Vent Mode:  [-]  FiO2 (%):  [40 %] 40 % INTAKE / OUTPUT: Intake/Output     05/19 0701 - 05/20 0700 05/20 0701 - 05/21 0700   P.O.  240   I.V. (mL/kg)     IV Piggyback 250    Total Intake(mL/kg) 250 (3.8) 240 (3.7)   Net +250 +240        Urine Occurrence 4 x     PHYSICAL EXAMINATION: Gen. Pleasant, well-nourished, in moderate respiratory distress, normal affect, on 6L [place], full face mask ENT - no lesions, no post nasal drip Neck: No JVD, no thyromegaly, no carotid bruits Lungs: + use of accessory muscles, no dullness to percussion, clear without rales or rhonchi  Cardiovascular: Rhythm regular, heart sounds  normal, no murmurs, no peripheral edema Abdomen: soft and non-tender, no hepatosplenomegaly, BS normal. Musculoskeletal: No deformities, no cyanosis or clubbing but bilateral hip point tenderness Neuro:  alert, non focal Skin:  Warm, no lesions/  rash  LABS:  CBC  Recent Labs Lab 02/02/14 0403 02/03/14 0343 02/04/14 0327  WBC 23.3* 25.2* 18.2*  HGB 12.9  11.6* 10.3* 10.7*  HCT 38.0  36.3 31.9* 32.5*  PLT 262 226 206   Coag's  Recent Labs Lab 02/02/14 0403 02/03/14 0343 02/04/14 0327  INR 3.87* 4.57* 3.58*   BMET  Recent Labs Lab 02/02/14 0403 02/03/14 0343 02/04/14 0327  NA 139  139 142 143  K 4.4  4.6 4.6 3.5*  CL 100  96 101 99  CO2 23 27 34*  BUN 31*  31* 39* 36*  CREATININE 1.50*  1.45* 1.14* 1.07  GLUCOSE 178*  187* 164* 181*   Electrolytes  Recent Labs Lab 02/02/14 0403 02/03/14 0343 02/04/14 0327  CALCIUM 9.2 9.2 8.9  MG  --   --  1.9  PHOS  --   --  3.0   Sepsis Markers  Recent Labs Lab 02/02/14 0403 02/02/14 0800  LATICACIDVEN 5.7* 1.6   ABG  Recent Labs Lab 02/02/14 0546  PHART 7.292*  PCO2ART 56.0*  PO2ART 90.0   Liver Enzymes No results found for this basename: AST, ALT, ALKPHOS, BILITOT, ALBUMIN,  in the last 168 hours Cardiac Enzymes  Recent Labs Lab 02/02/14 0731 02/02/14 0954 02/02/14 1620  TROPONINI 0.63* 0.53* 0.39*   Glucose  Recent Labs Lab 02/02/14 0442  GLUCAP 205*   Imaging Dg Chest Port 1 View  02/04/2014  CLINICAL DATA:  Respiratory failure  EXAM: PORTABLE CHEST - 1 VIEW  COMPARISON:  02/02/2014  FINDINGS: Cardiomediastinal silhouette is stable. Mild hyperinflation again noted. Worsening mild interstitial prominence and mild increased bronchial markings. Early edema or pneumonitis cannot be excluded. No central vascular congestion. No focal segmental infiltrate. Follow-up examination recommended.  IMPRESSION: Mild hyperinflation again noted. Worsening mild interstitial prominence and mild increased bronchial markings. Early edema or pneumonitis cannot be excluded. No central vascular congestion. No focal segmental infiltrate.   Electronically Signed   By: Lahoma Crocker M.D.   On: 02/04/2014 08:16   CXR: old left rib fractures, no infx or  effusion  ASSESSMENT / PLAN:  PULMONARY A:Acute hypercarbic resp failure Chronic hypoxic resp failure COPD exacerbation -recently treated with steroids, no bspasm on exam Allergies - on xolair & allergy shots OSA P:   - Decrease to solumedrol 30 q12, will maintain for now, on prednisone 5 mg PO daily as outpatient. - Change BiPAP to nocturnal CPAP. - Continue duonebs - Ct singulair. - Abx as below.  CARDIOVASCULAR A: Atrial flutter/ fibrillation -on coumadin Syncope Echo 05/2012 - RVSP 45, gr 2 diastolic dysfn Lactic acidosis - related to fall, doubt sepsis P:  - Cont flecainide/ warferin per INR. - Tele monitoring. - Stress dose steroids. - Active diureses today.  RENAL A:  AKI P:   - Avoid nephrotoxins. - Aggressive lasix now that BP is more stable and Cr is improving.  GASTROINTESTINAL A:  No issues P:   - DNR, diet for comfort at this point.  HEMATOLOGIC A:  Leucocytosis - infection vs steroid related P:  - On steroids and suspect COPD exac  INFECTIOUS A:  UTI/sepsis P:   - Empiric abx -cefepime/ vanc - Respiratory viral panel pending.  ENDOCRINE A:  Hypothyroid  steroid induced hyperglycemia   P:   - SSI. - Synthroid as ordered.  NEUROLOGIC A:  Falls, syncope P:   - PT consult, reassess home status when able to get off BiPAP. - Non focal exam, no head injury documented - but low threshold to consider head CT.  TODAY'S SUMMARY: Much improved this AM, back to [place], titrate O2 down as able, switch BiPAP to CPAP, bilateral hip x-ray due to multiple falls and pain.  Will transfer care back to Dr. Deforest Hoyles, please arrange f/u with PCCM upon discharge.  I have personally obtained a history, examined the patient, evaluated laboratory and imaging results, formulated the assessment and plan and placed orders.  Rush Farmer, M.D. [facility] Pulmonary/Critical Care Medicine. Pager: [phone]. After hours pager: [phone].

## 2014-02-04 NOTE — Progress Notes (Addendum)
Notified by Dr. Lake Bells of pt's parasympheseal fractures. MD talked to [facility] MD. NO intervention ordered. PT/OT consult placed. RN will pass onto day shift RN and MD. Will continue to assess and manage pt's pain.

## 2014-02-04 NOTE — Progress Notes (Signed)
Alerted E-link nurse Cyril Mourning to have MD review xray results.   Etta Quill

## 2014-02-04 NOTE — Progress Notes (Signed)
[facility] Progress Note Patient Name: [name] DOB: 12-28-1935 MRN: [id number]  Date of Service  02/04/2014   HPI/Events of Note  Hip x-ray resulted today> bilateral parasympheseal fractures  Discussed case with orthopedic surgery (Dr. Erlinda Hong) who reviewed the films.  He recommends pain control and physical therapy.  She can weight bear as tolerated.     eICU Interventions  PT consult placed Patient currently asleep on CPAP, have asked RN to communicate to rounding physician in AM to discuss with patient    Intervention Category Intermediate Interventions: Diagnostic test evaluation  Juanito Doom 02/04/2014, 9:50 PM

## 2014-02-04 NOTE — Progress Notes (Signed)
ANTICOAGULATION CONSULT NOTE - Follow Up Consult  Pharmacy Consult for coumadin Indication: atrial fibrillation  Allergies  Allergen Reactions  . Clarithromycin     REACTION: nausea    Patient Measurements: Height: 5\' 5"  (165.1 cm) Weight: 144 lb (65.318 kg) IBW/kg (Calculated) : 57  Vital Signs: Temp: 98 F (36.7 C) (05/20 0800) Temp src: Axillary (05/20 0800) BP: 177/62 mmHg (05/20 0929) Pulse Rate: 94 (05/20 0929)  Labs:  Recent Labs  02/02/14 0403 02/02/14 0731 02/02/14 0954 02/02/14 1620 02/03/14 0343 02/04/14 0327  HGB 12.9  11.6*  --   --   --  10.3* 10.7*  HCT 38.0  36.3  --   --   --  31.9* 32.5*  PLT 262  --   --   --  226 206  LABPROT 36.5*  --   --   --  41.5* 34.4*  INR 3.87*  --   --   --  4.57* 3.58*  CREATININE 1.50*  1.45*  --   --   --  1.14* 1.07  TROPONINI  --  0.63* 0.53* 0.39*  --   --     Estimated Creatinine Clearance: 40.9 ml/min (by C-G formula based on Cr of 1.07).   Medications:  Scheduled:  . antiseptic oral rinse  15 mL Mouth Rinse BID  . buPROPion  150 mg Oral BID  . ceFEPime (MAXIPIME) IV  1 g Intravenous Q24H  . diltiazem  240 mg Oral Daily  . [START ON 02/05/2014] flecainide  50 mg Oral Q12H  . levothyroxine  75 mcg Oral QAC breakfast  . methylPREDNISolone (SOLU-MEDROL) injection  60 mg Intravenous Q12H  . mometasone-formoterol  2 puff Inhalation BID  . montelukast  10 mg Oral QHS  . theophylline  150 mg Oral BID  . tiotropium  18 mcg Inhalation Daily  . vancomycin  1,000 mg Intravenous Q24H  . Warfarin - Pharmacist Dosing Inpatient   Does not apply q1800    Assessment: 78 yo female on coumadin PTA for afib. Pharmacy has been consulted to dose.  INR= 3.58 and trend down (patient noted on antibiotics). Noted for consideration for transition to comfort care  Goal of Therapy:  INR 2-3 Monitor platelets by anticoagulation protocol: Yes   Plan:  -Hold coumadin today -Daily PT/IINR  Hildred Laser, Pharm D 02/04/2014  11:04 AM

## 2014-02-04 NOTE — Progress Notes (Signed)
Provided bed offers for SNF to pt's daughter at bedside. Daughter has selected [facility]. CSW informed facility and will facilitate discharge when pt is medically ready.   Ky Barban, MSW, [facility] Clinical Social Worker [phone]

## 2014-02-05 LAB — CBC
HEMATOCRIT: 33.9 % — AB (ref 36.0–46.0)
Hemoglobin: 10.8 g/dL — ABNORMAL LOW (ref 12.0–15.0)
MCH: 29.8 pg (ref 26.0–34.0)
MCHC: 31.9 g/dL (ref 30.0–36.0)
MCV: 93.6 fL (ref 78.0–100.0)
Platelets: 249 10*3/uL (ref 150–400)
RBC: 3.62 MIL/uL — ABNORMAL LOW (ref 3.87–5.11)
RDW: 14.1 % (ref 11.5–15.5)
WBC: 21.3 10*3/uL — AB (ref 4.0–10.5)

## 2014-02-05 LAB — PROTIME-INR
INR: 2.63 — AB (ref 0.00–1.49)
Prothrombin Time: 27.2 seconds — ABNORMAL HIGH (ref 11.6–15.2)

## 2014-02-05 LAB — MAGNESIUM: Magnesium: 2 mg/dL (ref 1.5–2.5)

## 2014-02-05 LAB — BASIC METABOLIC PANEL
BUN: 44 mg/dL — AB (ref 6–23)
CHLORIDE: 100 meq/L (ref 96–112)
CO2: 34 mEq/L — ABNORMAL HIGH (ref 19–32)
Calcium: 9.2 mg/dL (ref 8.4–10.5)
Creatinine, Ser: 1.09 mg/dL (ref 0.50–1.10)
GFR calc Af Amer: 56 mL/min — ABNORMAL LOW (ref >90)
GFR calc non Af Amer: 48 mL/min — ABNORMAL LOW (ref >90)
GLUCOSE: 183 mg/dL — AB (ref 70–99)
POTASSIUM: 5.2 meq/L (ref 3.7–5.3)
Sodium: 144 mEq/L (ref 137–147)

## 2014-02-05 LAB — PHOSPHORUS: Phosphorus: 3.2 mg/dL (ref 2.3–4.6)

## 2014-02-05 MED ORDER — WARFARIN SODIUM 1 MG PO TABS
1.0000 mg | ORAL_TABLET | Freq: Once | ORAL | Status: AC
  Administered 2014-02-05: 1 mg via ORAL
  Filled 2014-02-05: qty 1

## 2014-02-05 MED ORDER — PREDNISONE 20 MG PO TABS
40.0000 mg | ORAL_TABLET | Freq: Every day | ORAL | Status: DC
  Administered 2014-02-05 – 2014-02-10 (×6): 40 mg via ORAL
  Filled 2014-02-05 (×7): qty 2

## 2014-02-05 MED ORDER — FUROSEMIDE 20 MG PO TABS
20.0000 mg | ORAL_TABLET | Freq: Two times a day (BID) | ORAL | Status: DC
  Administered 2014-02-05 (×2): 20 mg via ORAL
  Filled 2014-02-05 (×5): qty 1

## 2014-02-05 MED ORDER — POTASSIUM CHLORIDE CRYS ER 10 MEQ PO TBCR
10.0000 meq | EXTENDED_RELEASE_TABLET | Freq: Once | ORAL | Status: AC
  Administered 2014-02-05: 10 meq via ORAL
  Filled 2014-02-05: qty 1

## 2014-02-05 MED ORDER — VENLAFAXINE HCL 37.5 MG PO TABS
37.5000 mg | ORAL_TABLET | Freq: Every day | ORAL | Status: DC
  Administered 2014-02-05: 37.5 mg via ORAL
  Filled 2014-02-05 (×2): qty 1

## 2014-02-05 MED ORDER — OXYCODONE-ACETAMINOPHEN 5-325 MG PO TABS
1.0000 | ORAL_TABLET | Freq: Four times a day (QID) | ORAL | Status: DC | PRN
  Administered 2014-02-05 – 2014-02-06 (×4): 2 via ORAL
  Administered 2014-02-07: 1 via ORAL
  Administered 2014-02-07 (×2): 2 via ORAL
  Administered 2014-02-08 – 2014-02-09 (×5): 1 via ORAL
  Filled 2014-02-05: qty 1
  Filled 2014-02-05: qty 2
  Filled 2014-02-05: qty 1
  Filled 2014-02-05 (×2): qty 2
  Filled 2014-02-05: qty 1
  Filled 2014-02-05 (×2): qty 2
  Filled 2014-02-05 (×2): qty 1
  Filled 2014-02-05: qty 2
  Filled 2014-02-05: qty 1

## 2014-02-05 NOTE — Progress Notes (Signed)
Plan remains discharge to [facility]. Pt can discharge to SNF as long as she is on nasal cannula and not CPAP/non-rebreather.   Ky Barban, MSW, [facility] Clinical Social Worker [phone]

## 2014-02-05 NOTE — Progress Notes (Signed)
This note also relates to the following rows which could not be included: Pulse Rate - Cannot attach notes to unvalidated device data Resp - Cannot attach notes to unvalidated device data SpO2 - Cannot attach notes to unvalidated device data    02/05/14 2330  BiPAP/CPAP/SIPAP  BiPAP/CPAP/SIPAP Pt Type Adult  Mask Type Full face mask  Respiratory Rate 21 breaths/min  Flow Rate 8 lpm  Patient Home Equipment Yes  Patient placed on Home Bipap with full face mask and 8L  o2 bleed in.

## 2014-02-05 NOTE — Progress Notes (Signed)
pts home cpap machine bought in by family today, everything has been checked over and machine okay to use tonight until it can be checked out tomorrow. Water added to machine and oxygen bleed in of 4L added. HR 96 RR 18 sats 95%. Will continue to monitor.

## 2014-02-05 NOTE — Progress Notes (Signed)
Subjective: Pt c/o pain in upper thigh Pelvic fx noted on xray Breathing better  Objective: Vital signs in last 24 hours: Temp:  [98 F (36.7 C)-98.8 F (37.1 C)] 98.1 F (36.7 C) (05/21 0358) Pulse Rate:  [85-107] 97 (05/21 0400) Resp:  [20-27] 21 (05/21 0400) BP: (83-177)/(44-72) 133/54 mmHg (05/21 0400) SpO2:  [87 %-99 %] 87 % (05/21 0400) Weight change:  Last BM Date: 01/31/14  Intake/Output from previous day: 05/20 0701 - 05/21 0700 In: 480 [P.O.:480] Out: -  Intake/Output this shift:    General appearance: alert Resp: clear to auscultation bilaterally Cardio: regular rate and rhythm Extremities: extremities normal, atraumatic, no cyanosis or edema  Lab Results:  Recent Labs  02/04/14 0327 02/05/14 0403  WBC 18.2* 21.3*  HGB 10.7* 10.8*  HCT 32.5* 33.9*  PLT 206 249   BMET  Recent Labs  02/04/14 0327 02/05/14 0403  NA 143 144  K 3.5* 5.2  CL 99 100  CO2 34* 34*  GLUCOSE 181* 183*  BUN 36* 44*  CREATININE 1.07 1.09  CALCIUM 8.9 9.2    Studies/Results: Dg Hip Bilateral W/pelvis  02/04/2014   CLINICAL DATA:  Recurrent falls.  EXAM: BILATERAL HIP WITH PELVIS - 4+ VIEW  COMPARISON:  Single view of the pelvis 10/25/2012.  FINDINGS: The patient has bilateral parasymphyseal pubic bone fractures which appear acute and are new since the prior exam. No hip fractures identified. Both hips are located.  IMPRESSION: Acute appearing bilateral parasymphyseal pubic bone fractures are new since the prior exam. Negative for hip fracture.   Electronically Signed   By: Inge Rise M.D.   On: 02/04/2014 18:04   Dg Chest Port 1 View  02/04/2014   CLINICAL DATA:  Respiratory failure  EXAM: PORTABLE CHEST - 1 VIEW  COMPARISON:  02/02/2014  FINDINGS: Cardiomediastinal silhouette is stable. Mild hyperinflation again noted. Worsening mild interstitial prominence and mild increased bronchial markings. Early edema or pneumonitis cannot be excluded. No central vascular  congestion. No focal segmental infiltrate. Follow-up examination recommended.  IMPRESSION: Mild hyperinflation again noted. Worsening mild interstitial prominence and mild increased bronchial markings. Early edema or pneumonitis cannot be excluded. No central vascular congestion. No focal segmental infiltrate.   Electronically Signed   By: Lahoma Crocker M.D.   On: 02/04/2014 08:16    Medications: I have reviewed the patient's current medications.  Assessment/Plan: Respiratory failure A/C with severe COPD and h/o lung Cancer; and HCAP: xray better-  Off Bipap during day- Continue CPAP at night and prn in daytime with naps; O2- 4-6 L [place] OSA- CPAP at night IV Antibiotic for now switch to oral tomorrow Leucocytosis: steroid induced- change to oral prednisone Continue pulmonory meds Lasix 20 mg bid Pelvic Fx noted- recent fall at home prior to Admission- pain control/ PT/ ortho consult Aflutter- continue flecainide; Cardizem; coumadin CKD stable- a/c renal failure due to HCAP Malnutrition protein moderate: nutrition consult Decondition/  SNF - pt prefer [facility]; Social work consult. DNR    LOS: 3 days   Wenda Low 02/05/2014, 7:40 AM

## 2014-02-05 NOTE — Evaluation (Signed)
Physical Therapy Evaluation Patient Details Name: [name] MRN: [id number] DOB: 03/07/1936 Today's Date: 02/05/2014   History of Present Illness  [name] is a 78 y.o. female who called EMS after a fall due to syncope at home. Acute respiratory distress requiring BiPAP.+ pna; Bilateral superior & inferior rami fx  PMHx-extensive history of COPD on home O2/chronic steroids; anxiety;   Clinical Impression  Pt admitted with syncope, fall, bil pelvic fractures and pna. Pt very debilitated from severe COPD and husband unable to care for her at home in current condition. Pt currently with functional limitations due to the deficits listed below (see PT Problem List).  Pt will benefit from skilled PT to increase their independence and safety with mobility to allow discharge to the venue listed below.       Follow Up Recommendations SNF    Equipment Recommendations  None recommended by PT    Recommendations for Other Services       Precautions / Restrictions Precautions Precautions: Fall Precaution Comments: h/o syncope Restrictions Weight Bearing Restrictions: Yes RLE Weight Bearing: Weight bearing as tolerated LLE Weight Bearing: Weight bearing as tolerated      Mobility  Bed Mobility               General bed mobility comments: pt requires +2 to roll per nursing due to pain and resisting movement; pt rolled for cleaning and ortho MD ~1 hour prior to PT arrival and pt begging not to be moved  Transfers                    Ambulation/Gait                Stairs            Wheelchair Mobility    Modified Rankin (Stroke Patients Only)       Balance                                             Pertinent Vitals/Pain 9/10 pelvic pain (lt >Rt) despite pre-medication with po and IV pain medicine [phone] with ability to slightly decr rate when asked to slow her breathing SaO2 91% on [place] O2    Home Living Family/patient  expects to be discharged to:: Skilled nursing facility Living Arrangements: Spouse/significant other               Additional Comments: Pt has agreed to SNF placement for therapy prior to d/c home. Bed offers have been made    Prior Function Level of Independence: Independent               Hand Dominance        Extremity/Trunk Assessment   Upper Extremity Assessment: Generalized weakness           Lower Extremity Assessment: RLE deficits/detail;LLE deficits/detail RLE Deficits / Details: pt with muscle guarding due to pain (despite maximum pain meds for pre-medication); with max encouragement and AAROM achieved 30 degrees hip and knee flexion; ankle DF to neutral LLE Deficits / Details: pt with muscle guarding due to pain (despite maximum pain meds for pre-medication); with max encouragement and AAROM achieved 15 degrees hip and knee flexion; ankle DF to neutral  Cervical / Trunk Assessment: Kyphotic (HOB elevated and on 2 pillows)  Communication   Communication: Other (comment) (limited by dyspnea)  Cognition Arousal/Alertness:  Awake/alert Behavior During Therapy: Anxious Overall Cognitive Status: Within Functional Limits for tasks assessed                      General Comments General comments (skin integrity, edema, etc.): Pt very anxious re: increasing pain and maintaining bil UE and LE in extension due to heightened anxiety; educated on relaxation techniques and role LE tension is playing on increasing pain due to muscle pull on pelvis with constant isometric contractions    Exercises General Exercises - Lower Extremity Ankle Circles/Pumps: AROM;Both;10 reps;Supine Quad Sets: AROM;Both;10 reps;Supine Heel Slides: AAROM;Both;5 reps;Supine      Assessment/Plan    PT Assessment Patient needs continued PT services  PT Diagnosis Difficulty walking;Acute pain   PT Problem List Decreased strength;Decreased range of motion;Decreased activity  tolerance;Decreased mobility;Decreased knowledge of use of DME;Cardiopulmonary status limiting activity;Pain  PT Treatment Interventions DME instruction;Gait training;Functional mobility training;Therapeutic activities;Therapeutic exercise;Patient/family education   PT Goals (Current goals can be found in the Care Plan section) Acute Rehab PT Goals Patient Stated Goal: agrees wants to move again to be able to go home PT Goal Formulation: With patient Time For Goal Achievement: 02/19/14 Potential to Achieve Goals: Fair    Frequency Min 2X/week   Barriers to discharge Decreased caregiver support      Co-evaluation               End of Session Equipment Utilized During Treatment: Oxygen Activity Tolerance: Patient limited by pain;Treatment limited secondary to medical complications (Comment) (anxiety) Patient left: in bed;with call bell/phone within reach;with family/visitor present Nurse Communication: Weight bearing status;Other (comment) (poor tolerance for movement)         Time: 5110-2111 PT Time Calculation (min): 19 min   Charges:   PT Evaluation $Initial PT Evaluation Tier I: 1 Procedure PT Treatments $Therapeutic Exercise: 8-22 mins   PT G CodesRexanne Mano 02/21/2014, 4:47 PM Pager [phone]

## 2014-02-05 NOTE — Consult Note (Signed)
ORTHOPAEDIC CONSULTATION  REQUESTING PHYSICIAN: Wenda Low, MD  Chief Complaint: Pelvic fractures  HPI: [name] is a 78 y.o. female who complains of pelvic pain s/p multiple mechanical falls.  Patient was admitted for COPD exacerbation.  Complaining of severe pelvic and groin pain.  Ortho consulted for pelvic fx.  Past Medical History  Diagnosis Date  . Unspecified sinusitis (chronic)   . Obstructive sleep apnea   . COPD (chronic obstructive pulmonary disease)     on home O2, chronic steroid use  . Asthma   . Lung nodule   . SVT (supraventricular tachycardia)   . Atrial flutter     typical appearing  . Hypothyroidism   . GERD (gastroesophageal reflux disease)   . Anxiety   . Depression   . History of radiation therapy 11/08/2009-11/18/2009    left upper lung  . Allergy   . Cancer     lung ca s/p XRT 2011  . Lung cancer     non-small cell ca let upper lung stage IA   Past Surgical History  Procedure Laterality Date  . Hysterectomy      abdominal  . Tonsillectomy    . Cystectomy      removal from scalp  . Bladder repair      bladder tack  . Appendectomy    . Lung biopsy      Needle bx lung nodule - atypical cells- complicated by PTX/ chest tube  . Abdominal hysterectomy    . Svt ablation  9//26/13    AVNRT and CTI ablation by Dr Rayann Heman   History   Social History  . Marital Status: Married    Spouse Name: N/A    Number of Children: 2  . Years of Education: N/A   Occupational History  . Retired    Social History Main Topics  . Smoking status: Former Smoker -- 2.00 packs/day for 40 years    Types: Cigarettes    Quit date: 09/18/1986  . Smokeless tobacco: Never Used  . Alcohol Use: No  . Drug Use: No  . Sexual Activity: None   Other Topics Concern  . None   Social History Narrative   Patient states former smoker.  Quit 1988.   Retired   Married with 2 children         Family History  Problem Relation Age of Onset  . Heart disease  Mother 2   Allergies  Allergen Reactions  . Clarithromycin     REACTION: nausea   Prior to Admission medications   Medication Sig Start Date End Date Taking? Authorizing Provider  albuterol (PROVENTIL HFA;VENTOLIN HFA) 108 (90 BASE) MCG/ACT inhaler Inhale 2 puffs into the lungs every 6 (six) hours as needed. For shortness of breath   Yes Historical Provider, MD  buPROPion (WELLBUTRIN SR) 150 MG 12 hr tablet Take 150 mg by mouth 2 (two) times daily.    Yes Historical Provider, MD  diltiazem (CARDIZEM CD) 240 MG 24 hr capsule take 1 capsule by mouth once daily 07/17/13  Yes Jettie Booze, MD  EPINEPHrine (EPIPEN) 0.3 mg/0.3 mL DEVI Inject 0.3 mg into the muscle as needed. For allergic reactions   Yes Historical Provider, MD  flecainide (TAMBOCOR) 50 MG tablet take 1 tablet by mouth every 12 hours   Yes Jettie Booze, MD  Fluticasone-Salmeterol (ADVAIR) 250-50 MCG/DOSE AEPB Inhale 1 puff into the lungs every 12 (twelve) hours. 07/18/13  Yes Deneise Lever, MD  guaiFENesin ([place]) 600 MG  12 hr tablet Take 600 mg by mouth every 12 (twelve) hours as needed. For congestion   Yes Historical Provider, MD  HYDROcodone-homatropine (HYCODAN) 5-1.5 MG/5ML syrup Take 5 mLs by mouth every 6 (six) hours as needed for cough. 01/23/14  Yes Deneise Lever, MD  ipratropium-albuterol (DUONEB) 0.5-2.5 (3) MG/3ML SOLN Take 3 mLs by nebulization every 6 (six) hours as needed. For shortness of breath 05/12/13  Yes Deneise Lever, MD  levothyroxine (SYNTHROID, LEVOTHROID) 75 MCG tablet Take 75 mcg by mouth daily.   Yes Historical Provider, MD  montelukast (SINGULAIR) 10 MG tablet Take 1 tablet (10 mg total) by mouth at bedtime. 10/14/13  Yes Deneise Lever, MD  NON FORMULARY CPAP    Yes Historical Provider, MD  NON FORMULARY Oxygen 2-4 LPM   Yes Historical Provider, MD  nystatin (MYCOSTATIN) 100000 UNIT/ML suspension Take 5 mLs by mouth 3 (three) times daily as needed (pain).   Yes Historical Provider, MD   omalizumab Arvid Right) 150 MG injection Inject 225 mg into the skin every 14 (fourteen) days. Friday   Yes Historical Provider, MD  predniSONE (DELTASONE) 5 MG tablet Take 1 tablet (5 mg total) by mouth daily. 01/21/14  Yes Deneise Lever, MD  risedronate (ACTONEL) 35 MG tablet Take 35 mg by mouth every 7 (seven) days. with water on empty stomach, nothing by mouth or lie down for next 30 minutes; takes on Saturdays   Yes Historical Provider, MD  theophylline (THEODUR) 300 MG 12 hr tablet Take 0.5 tablets (150 mg total) by mouth 2 (two) times daily. 10/14/13  Yes Deneise Lever, MD  tiotropium (SPIRIVA) 18 MCG inhalation capsule Place 18 mcg into inhaler and inhale daily.   Yes Historical Provider, MD  traMADol (ULTRAM) 50 MG tablet Take 1 tablet by mouth 4 (four) times daily as needed (pain).  10/25/11  Yes Historical Provider, MD  venlafaxine (EFFEXOR) 37.5 MG tablet 37.5 mg. Take one time daily   Yes Historical Provider, MD  warfarin (COUMADIN) 2 MG tablet Take 2 mg by mouth every evening.   Yes Historical Provider, MD  zolpidem (AMBIEN) 5 MG tablet Take 2.5 mg by mouth at bedtime as needed. For sleep   Yes Historical Provider, MD  amoxicillin-clavulanate (AUGMENTIN) 500-125 MG per tablet 1 twice daily 01/23/14   Deneise Lever, MD  predniSONE (DELTASONE) 10 MG tablet Take 4 tabs daily x 2 days, 3 tabs daily x 2 days, 2 tabs daily x 2 days, 1 tab daily x 2 days then stop 01/23/14   Deneise Lever, MD   Dg Hip Bilateral W/pelvis  02/04/2014   CLINICAL DATA:  Recurrent falls.  EXAM: BILATERAL HIP WITH PELVIS - 4+ VIEW  COMPARISON:  Single view of the pelvis 10/25/2012.  FINDINGS: The patient has bilateral parasymphyseal pubic bone fractures which appear acute and are new since the prior exam. No hip fractures identified. Both hips are located.  IMPRESSION: Acute appearing bilateral parasymphyseal pubic bone fractures are new since the prior exam. Negative for hip fracture.   Electronically Signed   By: Inge Rise M.D.   On: 02/04/2014 18:04   Dg Chest Port 1 View  02/04/2014   CLINICAL DATA:  Respiratory failure  EXAM: PORTABLE CHEST - 1 VIEW  COMPARISON:  02/02/2014  FINDINGS: Cardiomediastinal silhouette is stable. Mild hyperinflation again noted. Worsening mild interstitial prominence and mild increased bronchial markings. Early edema or pneumonitis cannot be excluded. No central vascular congestion. No focal segmental infiltrate. Follow-up  examination recommended.  IMPRESSION: Mild hyperinflation again noted. Worsening mild interstitial prominence and mild increased bronchial markings. Early edema or pneumonitis cannot be excluded. No central vascular congestion. No focal segmental infiltrate.   Electronically Signed   By: Lahoma Crocker M.D.   On: 02/04/2014 08:16    Positive ROS: All other systems have been reviewed and were otherwise negative with the exception of those mentioned in the HPI and as above.  Physical Exam: General: Alert, no acute distress Cardiovascular: No pedal edema Respiratory: No cyanosis, no use of accessory musculature GI: No organomegaly, abdomen is soft and non-tender Skin: No lesions in the area of chief complaint Neurologic: Sensation intact distally Psychiatric: Patient is competent for consent with normal mood and affect Lymphatic: No axillary or cervical lymphadenopathy  MUSCULOSKELETAL:  - TTP over pubis - pain with rolling of patient - BLE NVI   Assessment: Bilateral superior & inferior rami fx   Plan: - WBAT BLE - pain control - up with PT - f/u 4 weeks  - Based on history and fracture pattern this likely represents a fragility fracture. - Fragility fractures affect up to one half of women and one third of men after age 23 years and occur in the setting of bone disorder such as osteoporosis or osteopenia and warrant appropriate work-up. - The following are general recommendations that may serve as an outline for an appropriate work-up:  1.)  Obtain bone density measurement to confirm presumptive diagnosis, assess severity of osteoporosis and risk of future fracture, and use as baseline for monitoring treatment  2.) Obtain laboratory tests: CBC, ESR, serum calcium, creatinine, albumin,phosphate, alkaline phosphatase, liver transaminases, protein electrophoresis, urinalysis, 25-hydroxyvitamin D.  3.) Exclude secondary causes of low bone mass and skeletal fragility (eg,multiple myeloma, lymphoma) as indicated.  4.) Obtain radiograph of thoracic and lumbar spine, particularly among individuals with back pain or height loss to assess presence of vertebral fractures  5.) Intermittent administration of recombinant human parathyroid hormone  6.) Optimize nutritional status using nutritional supplementation.  7.) Patient/family education to prevent future falls.  8.) Early mobilization and exercise program - exercise decreases the rate of bone loss and has been associated with decreased rate of fragility fractures   Thank you for the consult and the opportunity to see Ms. Kingsbury  N. Eduard Roux, MD [place] 2:19 PM

## 2014-02-05 NOTE — Progress Notes (Addendum)
\ CONSULT NOTE - FOLLOW UP  Pharmacy Consult for vancomycin, cefepime, coumadin Indication: HCAP  Allergies  Allergen Reactions  . Clarithromycin     REACTION: nausea    Patient Measurements: Height: 5\' 5"  (165.1 cm) Weight: 144 lb (65.318 kg) IBW/kg (Calculated) : 57  Vital Signs: Temp: 98.6 F (37 C) (05/21 1200) Temp src: Oral (05/21 1200) BP: 133/92 mmHg (05/21 1200) Pulse Rate: 108 (05/21 1200) Intake/Output from previous day: 05/20 0701 - 05/21 0700 In: 480 [P.O.:480] Out: -  Intake/Output from this shift: Total I/O In: 100 [P.O.:100] Out: -   Labs:  Recent Labs  02/03/14 0343 02/04/14 0327 02/05/14 0403  WBC 25.2* 18.2* 21.3*  HGB 10.3* 10.7* 10.8*  PLT 226 206 249  CREATININE 1.14* 1.07 1.09   Estimated Creatinine Clearance: 40.1 ml/min (by C-G formula based on Cr of 1.09). No results found for this basename: VANCOTROUGH, Corlis Leak, VANCORANDOM, GENTTROUGH, GENTPEAK, GENTRANDOM, TOBRATROUGH, TOBRAPEAK, TOBRARND, AMIKACINPEAK, AMIKACINTROU, AMIKACIN,  in the last 72 hours   Microbiology: Recent Results (from the past 720 hour(s))  CULTURE, BLOOD (ROUTINE X 2)     Status: None   Collection Time    02/02/14  3:59 AM      Result Value Ref Range Status   Specimen Description BLOOD RIGHT FOREARM   Final   Special Requests BOTTLES DRAWN AEROBIC AND ANAEROBIC 5CC EA   Final   Culture  Setup Time     Final   Value: 02/02/2014 08:56     Performed at [facility]   Culture     Final   Value:        BLOOD CULTURE RECEIVED NO GROWTH TO DATE CULTURE WILL BE HELD FOR 5 DAYS BEFORE ISSUING A FINAL NEGATIVE REPORT     Performed at [facility]   Report Status PENDING   Incomplete  CULTURE, BLOOD (ROUTINE X 2)     Status: None   Collection Time    02/02/14  4:22 AM      Result Value Ref Range Status   Specimen Description BLOOD RIGHT ANTECUBITAL   Final   Special Requests BOTTLES DRAWN AEROBIC ONLY 5CC   Final   Culture  Setup Time     Final   Value: 02/02/2014 08:56     Performed at [facility]   Culture     Final   Value:        BLOOD CULTURE RECEIVED NO GROWTH TO DATE CULTURE WILL BE HELD FOR 5 DAYS BEFORE ISSUING A FINAL NEGATIVE REPORT     Performed at [facility]   Report Status PENDING   Incomplete  MRSA PCR SCREENING     Status: None   Collection Time    02/02/14  6:27 AM      Result Value Ref Range Status   MRSA by PCR NEGATIVE  NEGATIVE Final   Comment:            The GeneXpert MRSA Assay (FDA     approved for NASAL specimens     only), is one component of a     comprehensive MRSA colonization     surveillance program. It is not     intended to diagnose MRSA     infection nor to guide or     monitor treatment for     MRSA infections.    Anti-infectives   Start     Dose/Rate Route Frequency Ordered Stop   02/03/14 0500  vancomycin (VANCOCIN) IVPB  1000 mg/200 mL premix     1,000 mg 200 mL/hr over 60 Minutes Intravenous Every 24 hours 02/02/14 1434     02/02/14 0530  ceFEPIme (MAXIPIME) 1 g in dextrose 5 % 50 mL IVPB     1 g 100 mL/hr over 30 Minutes Intravenous Every 24 hours 02/02/14 0529     02/02/14 0500  piperacillin-tazobactam (ZOSYN) IVPB 2.25 g  Status:  Discontinued     2.25 g 100 mL/hr over 30 Minutes Intravenous  Once 02/02/14 0441 02/02/14 0704   02/02/14 0445  vancomycin (VANCOCIN) IVPB 1000 mg/200 mL premix     1,000 mg 200 mL/hr over 60 Minutes Intravenous  Once 02/02/14 0441 02/02/14 0615      Assessment: 78 yo female with possible PNA on vancomycin and cefepime. SCr= 1.09 and CrCl ~ 40 (improving). Noted for change to po antibiotics in am.   5/18 Vanc>> 5/18 Cefepime>>  5/18 Bld x 2>>  Patient also noted on coumadin PTA for afib and INR is now 2.63 (noted 4.57 and 3.58 in last couple days)  Home coumadin dose: 2mg /day  Goal of Therapy:  Vancomycin trough level 15-20 mcg/ml INR= 2-3 Platelet monitoring per protocol: yes  Plan:  -No antibiotic changes -Will  follow renal function, cultures and clinical progress -Coumadin 1mg  po today -Daily PT/INR   Hildred Laser, Pharm D 02/05/2014 1:47 PM

## 2014-02-06 LAB — CBC
HCT: 32.6 % — ABNORMAL LOW (ref 36.0–46.0)
Hemoglobin: 10.6 g/dL — ABNORMAL LOW (ref 12.0–15.0)
MCH: 30.1 pg (ref 26.0–34.0)
MCHC: 32.5 g/dL (ref 30.0–36.0)
MCV: 92.6 fL (ref 78.0–100.0)
Platelets: 261 10*3/uL (ref 150–400)
RBC: 3.52 MIL/uL — AB (ref 3.87–5.11)
RDW: 14.3 % (ref 11.5–15.5)
WBC: 20.5 10*3/uL — ABNORMAL HIGH (ref 4.0–10.5)

## 2014-02-06 LAB — BASIC METABOLIC PANEL
BUN: 61 mg/dL — ABNORMAL HIGH (ref 6–23)
CHLORIDE: 97 meq/L (ref 96–112)
CO2: 31 meq/L (ref 19–32)
Calcium: 9.3 mg/dL (ref 8.4–10.5)
Creatinine, Ser: 1.42 mg/dL — ABNORMAL HIGH (ref 0.50–1.10)
GFR calc Af Amer: 41 mL/min — ABNORMAL LOW (ref >90)
GFR, EST NON AFRICAN AMERICAN: 35 mL/min — AB (ref >90)
GLUCOSE: 156 mg/dL — AB (ref 70–99)
POTASSIUM: 5.5 meq/L — AB (ref 3.7–5.3)
SODIUM: 139 meq/L (ref 137–147)

## 2014-02-06 LAB — PROTIME-INR
INR: 1.98 — AB (ref 0.00–1.49)
PROTHROMBIN TIME: 21.9 s — AB (ref 11.6–15.2)

## 2014-02-06 MED ORDER — WARFARIN SODIUM 2 MG PO TABS
2.0000 mg | ORAL_TABLET | Freq: Every day | ORAL | Status: DC
  Administered 2014-02-06 – 2014-02-07 (×2): 2 mg via ORAL
  Filled 2014-02-06 (×3): qty 1

## 2014-02-06 MED ORDER — VENLAFAXINE HCL ER 37.5 MG PO CP24
37.5000 mg | ORAL_CAPSULE | Freq: Every day | ORAL | Status: DC
  Administered 2014-02-07 – 2014-02-10 (×4): 37.5 mg via ORAL
  Filled 2014-02-06 (×6): qty 1

## 2014-02-06 NOTE — Progress Notes (Signed)
Transferred to [facility] room 23 by bed , stable.[email or web address] with pt.

## 2014-02-06 NOTE — Progress Notes (Signed)
Pt transferred to 3W. Covering unit CSW provided a handoff. This CSW signing off.   Ky Barban, MSW, [facility] Clinical Social Worker [phone]

## 2014-02-06 NOTE — Progress Notes (Signed)
ANTICOAGULATION CONSULT NOTE - Follow Up Consult  Pharmacy Consult for coumadin Indication: atrial fibrillation  Allergies  Allergen Reactions  . Clarithromycin     REACTION: nausea    Patient Measurements: Height: 5\' 5"  (165.1 cm) Weight: 144 lb (65.318 kg) IBW/kg (Calculated) : 57  Vital Signs: Temp: 97.6 F (36.4 C) (05/22 0811) Temp src: Axillary (05/22 0811) BP: 169/56 mmHg (05/22 0900) Pulse Rate: 81 (05/22 0900)  Labs:  Recent Labs  02/04/14 0327 02/05/14 0403 02/06/14 0300  HGB 10.7* 10.8* 10.6*  HCT 32.5* 33.9* 32.6*  PLT 206 249 261  LABPROT 34.4* 27.2* 21.9*  INR 3.58* 2.63* 1.98*  CREATININE 1.07 1.09 1.42*    Estimated Creatinine Clearance: 30.8 ml/min (by C-G formula based on Cr of 1.42).   Medications:  Scheduled:  . antiseptic oral rinse  15 mL Mouth Rinse BID  . buPROPion  150 mg Oral BID  . ceFEPime (MAXIPIME) IV  1 g Intravenous Q24H  . dextromethorphan-guaiFENesin  1 tablet Oral BID  . diltiazem  240 mg Oral Daily  . flecainide  50 mg Oral Q12H  . levothyroxine  75 mcg Oral QAC breakfast  . mometasone-formoterol  2 puff Inhalation BID  . montelukast  10 mg Oral QHS  . predniSONE  40 mg Oral Q breakfast  . theophylline  150 mg Oral BID  . tiotropium  18 mcg Inhalation Daily  . venlafaxine XR  37.5 mg Oral Q breakfast  . warfarin  2 mg Oral q1800  . Warfarin - Pharmacist Dosing Inpatient   Does not apply q1800    Assessment: 78 yo female on coumadin PTA for afib. Pharmacy has been consulted to dose. INR is down to 1.98 today.  Noted for consideration for transition to comfort care  Goal of Therapy:  INR 2-3 Monitor platelets by anticoagulation protocol: Yes   Plan:  Continue Coumadin 2mg  po x1 today as ordered per MD -Daily PT/IINR  Sloan Leiter, PharmD, BCPS Clinical Pharmacist [phone]   02/06/2014 12:11 PM

## 2014-02-06 NOTE — Progress Notes (Signed)
Subjective: Pt sleeping- o2 sat ok   Objective: Vital signs in last 24 hours: Temp:  [98.4 F (36.9 C)-98.9 F (37.2 C)] 98.8 F (37.1 C) (05/22 0300) Pulse Rate:  [74-108] 74 (05/22 0600) Resp:  [13-25] 14 (05/22 0600) BP: (116-175)/(41-92) 136/44 mmHg (05/22 0600) SpO2:  [56 %-100 %] 99 % (05/22 0600) FiO2 (%):  [50 %] 50 % (05/22 0300) Weight change:  Last BM Date: 02/04/14  Intake/Output from previous day: 05/21 0701 - 05/22 0700 In: 960 [P.O.:710; IV Piggyback:250] Out: -  Intake/Output this shift:    General appearance: alert Resp: clear to auscultation bilaterally Cardio: regular rate and rhythm and 1/6 sm Extremities: extremities normal, atraumatic, no cyanosis or edema  Lab Results:  Recent Labs  02/05/14 0403 02/06/14 0300  WBC 21.3* 20.5*  HGB 10.8* 10.6*  HCT 33.9* 32.6*  PLT 249 261   BMET  Recent Labs  02/05/14 0403 02/06/14 0300  NA 144 139  K 5.2 5.5*  CL 100 97  CO2 34* 31  GLUCOSE 183* 156*  BUN 44* 61*  CREATININE 1.09 1.42*  CALCIUM 9.2 9.3    Studies/Results: Dg Hip Bilateral W/pelvis  02/04/2014   CLINICAL DATA:  Recurrent falls.  EXAM: BILATERAL HIP WITH PELVIS - 4+ VIEW  COMPARISON:  Single view of the pelvis 10/25/2012.  FINDINGS: The patient has bilateral parasymphyseal pubic bone fractures which appear acute and are new since the prior exam. No hip fractures identified. Both hips are located.  IMPRESSION: Acute appearing bilateral parasymphyseal pubic bone fractures are new since the prior exam. Negative for hip fracture.   Electronically Signed   By: Inge Rise M.D.   On: 02/04/2014 18:04    Medications: I have reviewed the patient's current medications.  Assessment/Plan: Respiratory failure A/C with severe COPD and h/o lung Cancer; and HCAP: xray better- Off Bipap during day- Continue CPAP at night and prn in daytime with naps; O2- 4-6 L [place]  OSA- CPAP at night  IV Antibiotic for now switch to oral tomorrow  ( d/c  Vanc/ may change to augmentin ) Leucocytosis: steroid induced- change to oral prednisone  Continue pulmonory meds  A/c renal failure- hold lasix- evolumic Pelvic Fx noted- recent fall at home prior to Admission- pain control/ PT/ ortho consult- noted ( out line w/u out pt)  Aflutter- continue flecainide; Cardizem; coumadin  Malnutrition protein moderate: nutrition consult  Decondition/ SNF - pt [facility]; ( anticipate in 1-2 days) Social work consult.  DNR Ok to tranfer to telemetry.  LOS: 4 days   Wenda Low 02/06/2014, 7:11 AM

## 2014-02-07 ENCOUNTER — Inpatient Hospital Stay (HOSPITAL_COMMUNITY): Payer: Medicare Other

## 2014-02-07 DIAGNOSIS — S32591A Other specified fracture of right pubis, initial encounter for closed fracture: Secondary | ICD-10-CM | POA: Diagnosis present

## 2014-02-07 DIAGNOSIS — S32592A Other specified fracture of left pubis, initial encounter for closed fracture: Secondary | ICD-10-CM | POA: Diagnosis present

## 2014-02-07 LAB — CBC
HCT: 32.5 % — ABNORMAL LOW (ref 36.0–46.0)
HEMOGLOBIN: 10.1 g/dL — AB (ref 12.0–15.0)
MCH: 29.4 pg (ref 26.0–34.0)
MCHC: 31.1 g/dL (ref 30.0–36.0)
MCV: 94.5 fL (ref 78.0–100.0)
Platelets: 259 10*3/uL (ref 150–400)
RBC: 3.44 MIL/uL — AB (ref 3.87–5.11)
RDW: 14.2 % (ref 11.5–15.5)
WBC: 13.7 10*3/uL — ABNORMAL HIGH (ref 4.0–10.5)

## 2014-02-07 LAB — BASIC METABOLIC PANEL
BUN: 60 mg/dL — ABNORMAL HIGH (ref 6–23)
CHLORIDE: 99 meq/L (ref 96–112)
CO2: 34 meq/L — AB (ref 19–32)
Calcium: 9.5 mg/dL (ref 8.4–10.5)
Creatinine, Ser: 1.23 mg/dL — ABNORMAL HIGH (ref 0.50–1.10)
GFR calc Af Amer: 48 mL/min — ABNORMAL LOW (ref >90)
GFR calc non Af Amer: 42 mL/min — ABNORMAL LOW (ref >90)
GLUCOSE: 155 mg/dL — AB (ref 70–99)
POTASSIUM: 4.9 meq/L (ref 3.7–5.3)
Sodium: 141 mEq/L (ref 137–147)

## 2014-02-07 LAB — PROTIME-INR
INR: 1.98 — AB (ref 0.00–1.49)
Prothrombin Time: 21.9 seconds — ABNORMAL HIGH (ref 11.6–15.2)

## 2014-02-07 NOTE — Progress Notes (Signed)
Called Dr, Maxwell Caul with Pt Lumbar Spine results.; notify of family inquiries about the results.  Told this writer to relay T12 compression fracture and sacral fracture findings; it was what he was suspecting; and that it will take time to heal. Spouse at bedside and patient made aware of these results and MD comments. Verbalized understanding.Marland Kitchen

## 2014-02-07 NOTE — Progress Notes (Signed)
Pt placed on home cpap for the night with 4 lt O2 bleed in. Will monitor

## 2014-02-07 NOTE — Progress Notes (Signed)
ANTICOAGULATION CONSULT NOTE - Follow Up Consult  Pharmacy Consult for coumadin Indication: atrial fibrillation  Allergies  Allergen Reactions  . Clarithromycin     REACTION: nausea    Patient Measurements: Height: 5\' 5"  (165.1 cm) Weight: 142 lb (64.411 kg) IBW/kg (Calculated) : 57  Vital Signs: Temp: 99.3 F (37.4 C) (05/23 0532) Temp src: Oral (05/23 0532) BP: 133/65 mmHg (05/23 0532) Pulse Rate: 93 (05/23 0532)  Labs:  Recent Labs  02/05/14 0403 02/06/14 0300 02/07/14 0400  HGB 10.8* 10.6* 10.1*  HCT 33.9* 32.6* 32.5*  PLT 249 261 259  LABPROT 27.2* 21.9* 21.9*  INR 2.63* 1.98* 1.98*  CREATININE 1.09 1.42* 1.23*    Estimated Creatinine Clearance: 35.6 ml/min (by C-G formula based on Cr of 1.23).   Medications:  Scheduled:  . antiseptic oral rinse  15 mL Mouth Rinse BID  . buPROPion  150 mg Oral BID  . ceFEPime (MAXIPIME) IV  1 g Intravenous Q24H  . dextromethorphan-guaiFENesin  1 tablet Oral BID  . diltiazem  240 mg Oral Daily  . flecainide  50 mg Oral Q12H  . levothyroxine  75 mcg Oral QAC breakfast  . mometasone-formoterol  2 puff Inhalation BID  . montelukast  10 mg Oral QHS  . predniSONE  40 mg Oral Q breakfast  . theophylline  150 mg Oral BID  . tiotropium  18 mcg Inhalation Daily  . venlafaxine XR  37.5 mg Oral Q breakfast  . warfarin  2 mg Oral q1800  . Warfarin - Pharmacist Dosing Inpatient   Does not apply q1800    Assessment: 78 yo female on coumadin PTA for afib, and pharmacy has been consulted to manage coumadin therapy. INR is slightly subtherapeutic today and unchanged at 1.98 after receiving home dose of 2 mg last night (PTA dose = 2 mg daily).    Hgb/pltc stable. No bleeding issues noted.   Goal of Therapy:  INR 2-3 Monitor platelets by anticoagulation protocol: Yes   Plan:  Continue Coumadin 2mg  po daily  -Daily PT/INR  Robbi Spells C. Lamya Lausch, PharmD Clinical Pharmacist-Resident Pager: [phone] Pharmacy:  [phone] 02/07/2014 10:23 AM

## 2014-02-07 NOTE — Progress Notes (Signed)
Pt having expiratory wheezing, difficulty breathing, and desats to 70's on 6 lpm nasal canula. Respiratory therapist called into room. Switched to venturi mask per RT; saturation in 90"s, occasionally in 80"s.Marland Kitchen

## 2014-02-07 NOTE — Progress Notes (Signed)
Assessment/Plan: Principal Problem:   HCAP (healthcare-associated pneumonia) - stable. Continue current meds and plan.  Active Problems:   COPD with emphysema   Acute respiratory failure with hypercapnia   COPD exacerbation   Syncope   AKI (acute kidney injury)   Bilateral pubic rami fractures - I don't think this would account for pain in her back. Will get lumbar spine films.    Subjective: Complains of lower back pain and pain across the tops of the pelvis as she tries to move. States she knows that she has fracture of pubic bones.   Feels like her breathing is improving.   Objective:  Vital Signs: Filed Vitals:   02/06/14 2158 02/07/14 0532 02/07/14 0631 02/07/14 0835  BP: 130/45 133/65    Pulse: 103 93    Temp: 99.5 F (37.5 C) 99.3 F (37.4 C)    TempSrc: Oral Oral    Resp: 20 20    Height:      Weight:   64.411 kg (142 lb)   SpO2: 94% 94%  92%     EXAM: LUNGS: clear laterally  No intake or output data in the 24 hours ending 02/07/14 0924  Lab Results:  Recent Labs  02/05/14 0403 02/06/14 0300 02/07/14 0400  NA 144 139 141  K 5.2 5.5* 4.9  CL 100 97 99  CO2 34* 31 34*  GLUCOSE 183* 156* 155*  BUN 44* 61* 60*  CREATININE 1.09 1.42* 1.23*  CALCIUM 9.2 9.3 9.5  MG 2.0  --   --   PHOS 3.2  --   --    No results found for this basename: AST, ALT, ALKPHOS, BILITOT, PROT, ALBUMIN,  in the last 72 hours No results found for this basename: LIPASE, AMYLASE,  in the last 72 hours  Recent Labs  02/06/14 0300 02/07/14 0400  WBC 20.5* 13.7*  HGB 10.6* 10.1*  HCT 32.6* 32.5*  MCV 92.6 94.5  PLT 261 259   No results found for this basename: CKTOTAL, CKMB, CKMBINDEX, TROPONINI,  in the last 72 hours BNP    Component Value Date/Time   PROBNP 159.4* 12/25/2012 1825   No results found for this basename: DDIMER,  in the last 72 hours No results found for this basename: HGBA1C,  in the last 72 hours No results found for this basename: CHOL, HDL, LDLCALC,  TRIG, CHOLHDL, LDLDIRECT,  in the last 72 hours No results found for this basename: TSH, T4TOTAL, FREET3, T3FREE, THYROIDAB,  in the last 72 hours No results found for this basename: VITAMINB12, FOLATE, FERRITIN, TIBC, IRON, RETICCTPCT,  in the last 72 hours  Studies/Results: No results found. Medications: Medications administered in the last 24 hours reviewed.  Current Medication List reviewed.    LOS: 5 days   Tyrone Schimke Internal Medicine @ [facility] [phone]) 02/07/2014, 9:24 AM

## 2014-02-08 ENCOUNTER — Inpatient Hospital Stay (HOSPITAL_COMMUNITY): Payer: Medicare Other

## 2014-02-08 DIAGNOSIS — S3210XA Unspecified fracture of sacrum, initial encounter for closed fracture: Secondary | ICD-10-CM | POA: Diagnosis present

## 2014-02-08 DIAGNOSIS — S22000A Wedge compression fracture of unspecified thoracic vertebra, initial encounter for closed fracture: Secondary | ICD-10-CM | POA: Diagnosis present

## 2014-02-08 LAB — CULTURE, BLOOD (ROUTINE X 2)
Culture: NO GROWTH
Culture: NO GROWTH

## 2014-02-08 LAB — PROTIME-INR
INR: 2.44 — ABNORMAL HIGH (ref 0.00–1.49)
Prothrombin Time: 25.7 seconds — ABNORMAL HIGH (ref 11.6–15.2)

## 2014-02-08 MED ORDER — WARFARIN SODIUM 1 MG PO TABS
1.0000 mg | ORAL_TABLET | Freq: Once | ORAL | Status: AC
  Administered 2014-02-08: 1 mg via ORAL
  Filled 2014-02-08: qty 1

## 2014-02-08 MED ORDER — MAGNESIUM HYDROXIDE 400 MG/5ML PO SUSP
30.0000 mL | Freq: Every day | ORAL | Status: DC | PRN
Start: 2014-02-08 — End: 2014-02-10

## 2014-02-08 NOTE — Progress Notes (Signed)
Assessment/Plan: Principal Problem:   HCAP (healthcare-associated pneumonia) - continues to improve. No new issues in regard to pulm status. Will stop telemetry Active Problems:   COPD with emphysema   Acute respiratory failure with hypercapnia   COPD exacerbation   Syncope   AKI (acute kidney injury)   Bilateral pubic rami fractures   Compression fracture of thoracic vertebra - ? New or old. I am not sure that a T12 fx would account for her pain    Sacral fracture - this could account for the pain. Will obtain CT scan of sacrum per radiol recommendations.    Subjective: Breathing is getting better. Still lots of pain with the pelvis, lower back area. Any movement is very painful.   Objective:  Vital Signs: Filed Vitals:   02/07/14 2106 02/08/14 0125 02/08/14 0150 02/08/14 0420  BP: 143/70 109/68  126/56  Pulse: 97 92 88 96  Temp: 98.3 F (36.8 C) 97.7 F (36.5 C)  97.6 F (36.4 C)  TempSrc: Oral Oral  Oral  Resp: 20 20 18 18   Height:      Weight:      SpO2: 96% 93% 95% 94%     EXAM: Breathing comfortably   Intake/Output Summary (Last 24 hours) at 02/08/14 0918 Last data filed at 02/07/14 2156  Gross per 24 hour  Intake    358 ml  Output      0 ml  Net    358 ml    Lab Results:  Recent Labs  02/06/14 0300 02/07/14 0400  NA 139 141  K 5.5* 4.9  CL 97 99  CO2 31 34*  GLUCOSE 156* 155*  BUN 61* 60*  CREATININE 1.42* 1.23*  CALCIUM 9.3 9.5   No results found for this basename: AST, ALT, ALKPHOS, BILITOT, PROT, ALBUMIN,  in the last 72 hours No results found for this basename: LIPASE, AMYLASE,  in the last 72 hours  Recent Labs  02/06/14 0300 02/07/14 0400  WBC 20.5* 13.7*  HGB 10.6* 10.1*  HCT 32.6* 32.5*  MCV 92.6 94.5  PLT 261 259   No results found for this basename: CKTOTAL, CKMB, CKMBINDEX, TROPONINI,  in the last 72 hours BNP    Component Value Date/Time   PROBNP 159.4* 12/25/2012 1825   No results found for this basename: DDIMER,  in  the last 72 hours No results found for this basename: HGBA1C,  in the last 72 hours No results found for this basename: CHOL, HDL, LDLCALC, TRIG, CHOLHDL, LDLDIRECT,  in the last 72 hours No results found for this basename: TSH, T4TOTAL, FREET3, T3FREE, THYROIDAB,  in the last 72 hours No results found for this basename: VITAMINB12, FOLATE, FERRITIN, TIBC, IRON, RETICCTPCT,  in the last 72 hours  Studies/Results: Dg Lumbar Spine 2-3 Views  02/07/2014   CLINICAL DATA:  Multiple falls.  Back pain  EXAM: LUMBAR SPINE - 2-3 VIEW  COMPARISON:  11/10/2009  FINDINGS: Lumbar alignment is normal.  No lumbar spine fracture.  Moderate compression fracture T12. This was not present on 10/21/2009 and could be acute or chronic.  There is a probable fracture in the mid to lower sacrum possibly with displaced fragments. Patient also has fractures of the pubic bone bilaterally. Consider CT for further evaluation.  IMPRESSION: Possible displaced sacral fracture. Consider CT for further evaluation.  Moderate compression fracture T12, possibly acute.   Electronically Signed   By: Franchot Gallo M.D.   On: 02/07/2014 16:42   Medications: Medications administered in the  last 24 hours reviewed.  Current Medication List reviewed.    LOS: 6 days   Tyrone Schimke Internal Medicine @ [facility] [phone]) 02/08/2014, 9:18 AM

## 2014-02-08 NOTE — Progress Notes (Signed)
ANTIBIOTIC CONSULT NOTE - FOLLOW UP  Pharmacy Consult for Cefepime Indication: pneumonia (HCAP)  Allergies  Allergen Reactions  . Clarithromycin     REACTION: nausea    Patient Measurements: Height: 5\' 5"  (165.1 cm) Weight: 142 lb (64.411 kg) IBW/kg (Calculated) : 57  Vital Signs: Temp: 97.6 F (36.4 C) (05/24 0420) Temp src: Oral (05/24 0420) BP: 126/56 mmHg (05/24 0420) Pulse Rate: 96 (05/24 0420) Intake/Output from previous day: 05/23 0701 - 05/24 0700 In: 358 [P.O.:358] Out: -  Intake/Output from this shift:    Labs:  Recent Labs  02/06/14 0300 02/07/14 0400  WBC 20.5* 13.7*  HGB 10.6* 10.1*  PLT 261 259  CREATININE 1.42* 1.23*   Estimated Creatinine Clearance: 35.6 ml/min (by C-G formula based on Cr of 1.23).   Microbiology: Recent Results (from the past 720 hour(s))  CULTURE, BLOOD (ROUTINE X 2)     Status: None   Collection Time    02/02/14  3:59 AM      Result Value Ref Range Status   Specimen Description BLOOD RIGHT FOREARM   Final   Special Requests BOTTLES DRAWN AEROBIC AND ANAEROBIC 5CC EA   Final   Culture  Setup Time     Final   Value: 02/02/2014 08:56     Performed at [facility]   Culture     Final   Value:        BLOOD CULTURE RECEIVED NO GROWTH TO DATE CULTURE WILL BE HELD FOR 5 DAYS BEFORE ISSUING A FINAL NEGATIVE REPORT     Performed at [facility]   Report Status PENDING   Incomplete  CULTURE, BLOOD (ROUTINE X 2)     Status: None   Collection Time    02/02/14  4:22 AM      Result Value Ref Range Status   Specimen Description BLOOD RIGHT ANTECUBITAL   Final   Special Requests BOTTLES DRAWN AEROBIC ONLY 5CC   Final   Culture  Setup Time     Final   Value: 02/02/2014 08:56     Performed at [facility]   Culture     Final   Value:        BLOOD CULTURE RECEIVED NO GROWTH TO DATE CULTURE WILL BE HELD FOR 5 DAYS BEFORE ISSUING A FINAL NEGATIVE REPORT     Performed at [facility]   Report Status  PENDING   Incomplete  MRSA PCR SCREENING     Status: None   Collection Time    02/02/14  6:27 AM      Result Value Ref Range Status   MRSA by PCR NEGATIVE  NEGATIVE Final   Comment:            The GeneXpert MRSA Assay (FDA     approved for NASAL specimens     only), is one component of a     comprehensive MRSA colonization     surveillance program. It is not     intended to diagnose MRSA     infection nor to guide or     monitor treatment for     MRSA infections.    Anti-infectives   Start     Dose/Rate Route Frequency Ordered Stop   02/03/14 0500  vancomycin (VANCOCIN) IVPB 1000 mg/200 mL premix  Status:  Discontinued     1,000 mg 200 mL/hr over 60 Minutes Intravenous Every 24 hours 02/02/14 1434 02/06/14 0711   02/02/14 0530  ceFEPIme (MAXIPIME) 1 g  in dextrose 5 % 50 mL IVPB     1 g 100 mL/hr over 30 Minutes Intravenous Every 24 hours 02/02/14 0529     02/02/14 0500  piperacillin-tazobactam (ZOSYN) IVPB 2.25 g  Status:  Discontinued     2.25 g 100 mL/hr over 30 Minutes Intravenous  Once 02/02/14 0441 02/02/14 0704   02/02/14 0445  vancomycin (VANCOCIN) IVPB 1000 mg/200 mL premix     1,000 mg 200 mL/hr over 60 Minutes Intravenous  Once 02/02/14 0441 02/02/14 0615      Assessment: 78 yo F admitted after syncopal episode at home, with reports of increased SOB and productive cough over the last week. Patient was diagnosed with HCAP, treated empirically with cefepime and vancomycin, managed by pharmacy. Patient continues on cefepime only at this time. Today is day 7 of antibiotics.  5/18 Vanc>> 5/22 5/18 Cefepime>>  Patient remains afebrile, and WBC have trended down (13.7 yesterday).   Blood cultures drawn 5/18 remain no growth to date.  Serum creatine increased on 5/22 to 1.42, and decreased to 1.23 on 5/23. No BMET ordered for today.    Goal of Therapy:  Appropriate antibiotic dosing  Plan:  Continue cefepime 1 g IV q 24h (appropriate dose for current renal  function) F/u antibiotic plans - tomorrow will be day 8, which is sufficient antibiotic duration for HCAP  Monitor renal function, fever trend, clinical progression  Makylee Sanborn C. Tobechukwu Emmick, PharmD Clinical Pharmacist-Resident Pager: [phone] Pharmacy: [phone] 02/08/2014 8:13 AM

## 2014-02-08 NOTE — Progress Notes (Signed)
ANTICOAGULATION CONSULT NOTE - Follow Up Consult  Pharmacy Consult for coumadin Indication: atrial fibrillation  Allergies  Allergen Reactions  . Clarithromycin     REACTION: nausea    Patient Measurements: Height: 5\' 5"  (165.1 cm) Weight: 142 lb (64.411 kg) IBW/kg (Calculated) : 57  Vital Signs: Temp: 97.6 F (36.4 C) (05/24 0420) Temp src: Oral (05/24 0420) BP: 126/56 mmHg (05/24 0420) Pulse Rate: 96 (05/24 0420)  Labs:  Recent Labs  02/06/14 0300 02/07/14 0400 02/08/14 0355  HGB 10.6* 10.1*  --   HCT 32.6* 32.5*  --   PLT 261 259  --   LABPROT 21.9* 21.9* 25.7*  INR 1.98* 1.98* 2.44*  CREATININE 1.42* 1.23*  --     Estimated Creatinine Clearance: 35.6 ml/min (by C-G formula based on Cr of 1.23).   Medications:  Scheduled:  . antiseptic oral rinse  15 mL Mouth Rinse BID  . buPROPion  150 mg Oral BID  . ceFEPime (MAXIPIME) IV  1 g Intravenous Q24H  . dextromethorphan-guaiFENesin  1 tablet Oral BID  . diltiazem  240 mg Oral Daily  . flecainide  50 mg Oral Q12H  . levothyroxine  75 mcg Oral QAC breakfast  . mometasone-formoterol  2 puff Inhalation BID  . montelukast  10 mg Oral QHS  . predniSONE  40 mg Oral Q breakfast  . theophylline  150 mg Oral BID  . tiotropium  18 mcg Inhalation Daily  . venlafaxine XR  37.5 mg Oral Q breakfast  . warfarin  2 mg Oral q1800  . Warfarin - Pharmacist Dosing Inpatient   Does not apply q1800    Assessment: 78 yo female on coumadin PTA for afib, and pharmacy has been consulted to manage coumadin therapy. INR is therapeutic today after receiving two days of her home dose of 2mg  daily. However, given significant increase in INR, will provide a more conservative dose for tonight to prevent INR from becoming SUPRAtherapeutic. Would anticipate return to home regimen tomorrow, depending on INR in AM tomorrow.    Hgb/pltc stable as of yesterday. No CBC ordered for today. No bleeding issues noted.   Goal of Therapy:  INR  2-3 Monitor platelets by anticoagulation protocol: Yes   Plan:  Coumadin 1 mg po x 1 dose tonight, likely resume home regimen tomorrow pending tomorrow's INR -Daily PT/INR  Annslee Tercero C. Mischell Branford, PharmD Clinical Pharmacist-Resident Pager: [phone] Pharmacy: [phone] 02/08/2014 8:15 AM

## 2014-02-08 NOTE — Progress Notes (Signed)
Placed patient on home CPAP with 4L 02 bled in.

## 2014-02-08 NOTE — Progress Notes (Signed)
Pt wheezing upon assessment. Stated she had some increased SOB this morning, but feels okay at this time. Pt in no obvious respiratory distress at this time. Pt didn't want PRN neb tx at this time, but I advised her if she felt she needed one to notify RN and I would administer tx. Pt agrees. RT will continue to monitor.

## 2014-02-09 LAB — PROTIME-INR
INR: 2.9 — AB (ref 0.00–1.49)
PROTHROMBIN TIME: 29.3 s — AB (ref 11.6–15.2)

## 2014-02-09 MED ORDER — ALPRAZOLAM 0.25 MG PO TABS: 0.2500 mg | ORAL_TABLET | Freq: Three times a day (TID) | ORAL | Status: DC | PRN

## 2014-02-09 MED ORDER — PREDNISONE 10 MG PO TABS: ORAL_TABLET | ORAL | Status: DC

## 2014-02-09 MED ORDER — FUROSEMIDE 10 MG/ML IJ SOLN
40.0000 mg | Freq: Once | INTRAMUSCULAR | Status: AC
Start: 2014-02-09 — End: 2014-02-09
  Administered 2014-02-09: 40 mg via INTRAVENOUS
  Filled 2014-02-09: qty 4

## 2014-02-09 MED ORDER — ACETAMINOPHEN 325 MG PO TABS: 650.0000 mg | ORAL_TABLET | Freq: Four times a day (QID) | ORAL | Status: AC | PRN

## 2014-02-09 MED ORDER — OXYCODONE-ACETAMINOPHEN 5-325 MG PO TABS: 1.0000 | ORAL_TABLET | Freq: Four times a day (QID) | ORAL | Status: DC | PRN

## 2014-02-09 MED ORDER — BIOTENE DRY MOUTH MT LIQD: 15.0000 mL | Freq: Two times a day (BID) | OROMUCOSAL | Status: AC

## 2014-02-09 MED ORDER — AMOXICILLIN-POT CLAVULANATE 500-125 MG PO TABS: ORAL_TABLET | ORAL | Status: DC

## 2014-02-09 NOTE — Progress Notes (Signed)
Placed patient on her home CPAP unit with 4L 02 bleed in.

## 2014-02-09 NOTE — Progress Notes (Signed)
Assessment/Plan: Principal Problem:   HCAP (healthcare-associated pneumonia) - does not seem worse after choking. Will not try to d/c today due to tenuous resp condition.  Active Problems:   COPD with emphysema   Acute respiratory failure with hypercapnia   COPD exacerbation   Syncope   AKI (acute kidney injury)   Bilateral pubic rami fractures   Compression fracture of thoracic vertebra - incompletely seen on LS CT. Some possible burst characteristics, but she is moving leg normally, is continent, and the retropulsion was mild.    Sacral fracture - mildly displaced.    Subjective: Patient had a choking spell a little while ago while eating. Feels fine now. No change in resp symptoms.   Can only find 1-2 positions which are painfree. She was trying to eat sitting up but back and pelvis pain was too much and so she lay back down and had choking spell above.   Objective:  Vital Signs: Filed Vitals:   02/08/14 1952 02/08/14 2052 02/09/14 0043 02/09/14 0526  BP:  135/66  134/61  Pulse: 101 99 99 92  Temp:  98.8 F (37.1 C)  97.4 F (36.3 C)  TempSrc:  Oral  Axillary  Resp: 18 16 18 16   Height:      Weight:      SpO2: 97% 96% 95% 98%     EXAM: no wheezing or tachypnea.   No intake or output data in the 24 hours ending 02/09/14 0850  Lab Results:  Recent Labs  02/07/14 0400  NA 141  K 4.9  CL 99  CO2 34*  GLUCOSE 155*  BUN 60*  CREATININE 1.23*  CALCIUM 9.5   No results found for this basename: AST, ALT, ALKPHOS, BILITOT, PROT, ALBUMIN,  in the last 72 hours No results found for this basename: LIPASE, AMYLASE,  in the last 72 hours  Recent Labs  02/07/14 0400  WBC 13.7*  HGB 10.1*  HCT 32.5*  MCV 94.5  PLT 259   No results found for this basename: CKTOTAL, CKMB, CKMBINDEX, TROPONINI,  in the last 72 hours BNP    Component Value Date/Time   PROBNP 159.4* 12/25/2012 1825   No results found for this basename: DDIMER,  in the last 72 hours No results found  for this basename: HGBA1C,  in the last 72 hours No results found for this basename: CHOL, HDL, LDLCALC, TRIG, CHOLHDL, LDLDIRECT,  in the last 72 hours No results found for this basename: TSH, T4TOTAL, FREET3, T3FREE, THYROIDAB,  in the last 72 hours No results found for this basename: VITAMINB12, FOLATE, FERRITIN, TIBC, IRON, RETICCTPCT,  in the last 72 hours  Studies/Results: Dg Lumbar Spine 2-3 Views  02/07/2014   CLINICAL DATA:  Multiple falls.  Back pain  EXAM: LUMBAR SPINE - 2-3 VIEW  COMPARISON:  11/10/2009  FINDINGS: Lumbar alignment is normal.  No lumbar spine fracture.  Moderate compression fracture T12. This was not present on 10/21/2009 and could be acute or chronic.  There is a probable fracture in the mid to lower sacrum possibly with displaced fragments. Patient also has fractures of the pubic bone bilaterally. Consider CT for further evaluation.  IMPRESSION: Possible displaced sacral fracture. Consider CT for further evaluation.  Moderate compression fracture T12, possibly acute.   Electronically Signed   By: Franchot Gallo M.D.   On: 02/07/2014 16:42   Ct Lumbar Spine Wo Contrast  02/08/2014   CLINICAL DATA:  Low back and bilateral flank pain after falling twice in the  last 2 weeks. History of lung cancer.  EXAM: CT LUMBAR SPINE WITHOUT CONTRAST  TECHNIQUE: Multidetector CT imaging of the lumbar spine was performed without intravenous contrast administration. Multiplanar CT image reconstructions were also generated.  COMPARISON:  The lumbar spine radiographs 02/07/2014. Abdominal CT 11/24/2008. PET-CT 02/13/2012.  FINDINGS: There are 5 lumbar type vertebral bodies. The bones are diffusely demineralized. There is a mildly displaced fracture involving the right L5 transverse process. There are mildly displaced vertical fractures involving the sacrum bilaterally. Sagittal images demonstrate angulation of the mid to lower sacrum. There is no sacroiliac joint diastasis. The symphysis pubis is  not imaged.  No lumbar spine vertebral body fractures are demonstrated. Of note, the T12 fracture noted on yesterday's radiographs is incompletely visualized on this examination of the lumbar spine. This fracture is associated with mild osseous retropulsion. No paraspinal hematoma is evident. The lumbar alignment is normal.  No lytic or blastic lesions are identified to suggest metastatic disease. There is no retroperitoneal lymphadenopathy. Aortoiliac atherosclerosis is noted.  There is relatively mild spondylosis with disc bulging and facet hypertrophy. There is no significant spinal stenosis or nerve root encroachment.  IMPRESSION: 1. There are mildly displaced fractures of the right L5 spinous process and the sacrum bilaterally. The sacral fractures may be posttraumatic or secondary to insufficiency fractures. 2. No evidence of metastatic disease or pathologic fracture. 3. No lumbar spine vertebral body fractures identified. Of note, the T12 fracture seen on yesterday's radiographs is incompletely visualized by this examination and not further characterized. It does demonstrate some osseous retropulsion consistent with a burst fracture.   Electronically Signed   By: Camie Patience M.D.   On: 02/08/2014 14:55   Medications: Medications administered in the last 24 hours reviewed.  Current Medication List reviewed.    LOS: 7 days   Tyrone Schimke Internal Medicine @ [facility] [phone]) 02/09/2014, 8:50 AM

## 2014-02-09 NOTE — Discharge Summary (Signed)
Physician Discharge Summary  Patient ID: [name] MRN: [id number] DOB/AGE: [birth year] 76 y.o.  Admit date: 02/02/2014 Discharge date: 02/10/2014  Admission Diagnoses:  Discharge Diagnoses:  Principal Problem:   HCAP (healthcare-associated pneumonia) Active Problems:   COPD with emphysema   Acute respiratory failure with hypercapnia   COPD exacerbation   Syncope   AKI (acute kidney injury) CKD Atrial  Flutter   Bilateral pubic rami fractures/ acute pain   Compression fracture of thoracic vertebra   Sacral fracture Deconditioning/ weakness  Discharged Condition: fair  Hospital Course: 24 female admit with Acute on chronic Respiratory failure/sepsis Problem #1: Respitatory Failure: HCAP/ COPD exacerbation; with underlying severe COPD on O2. CCM- consulted- managed on BIPAP,  No intubation per patients wishes. Able to get off BIPAP. On O2- 2-4  [place], Tx with Antibiotic - Cefepime and Vancomycin; switch to PO Augmentin for 10 days. IV steroids then taper PO prednisone, Neb Tx.  COPD exac: prednisone taper and Nebs/ O2 OSA- CPAP at night with O2- per home setting Problem # 2: A-futter- on tambocor, in NSR; Coumadin with INR 2-3 range.  Follow INR closely while on Antibiotic. Problem#3: s/p Fall prior to admission at home- with underlying Osteoporosis- Bilat pelvic FX; and sacral FX; also on spine xray- t12 compression FX- CT spine showed Acute T12 compression FX- Ortho consults- recommend  Pain control; WBAT; PT. insufficiency FXs due to osteoporosis. Problem # 4: anemia- ACD- stable. Leukocytosis: due to Steroid and HCAP Depression: continue same meds. Elevated Troponin: due to hypoxia.   Consults: pulmonary/intensive care and orthopedic surgery  Significant Diagnostic Studies: labs: WBC 13; HGB- 10.7; INR 2.9; K 4.9; Cr 1.23; Mg 2.0; Troponin 0.39 , microbiology: blood culture: negative and radiology: CXR: infiltrates: lower lobe on the right and CT scan: Lumbar- T12  compression Fx  Treatments: IV hydration, antibiotics: vancomycin, steroids: solu-medrol and prednisone and respiratory therapy: O2 and albuterol/atropine nebulizer  Discharge Exam: Blood pressure 137/67, pulse 76, temperature 97.6 F (36.4 C), temperature source Axillary, resp. rate 15, height 5\' 5"  (1.651 m), weight 64.411 kg (142 lb), SpO2 98.00%. General appearance: alert Resp: clear to auscultation bilaterally Cardio: regular rate and rhythm GI: soft, non-tender; bowel sounds normal; no masses,  no organomegaly Extremities: extremities normal, atraumatic, no cyanosis or edema  Disposition: SNF- Rehab     Medication List    STOP taking these medications       HYDROcodone-homatropine 5-1.5 MG/5ML syrup  Commonly known as:  HYCODAN      TAKE these medications       acetaminophen 325 MG tablet  Commonly known as:  TYLENOL  Take 2 tablets (650 mg total) by mouth every 6 (six) hours as needed for mild pain.     albuterol 108 (90 BASE) MCG/ACT inhaler  Commonly known as:  PROVENTIL HFA;VENTOLIN HFA  Inhale 2 puffs into the lungs every 6 (six) hours as needed. For shortness of breath     ALPRAZolam 0.25 MG tablet  Commonly known as:  XANAX  Take 1 tablet (0.25 mg total) by mouth every 8 (eight) hours as needed for anxiety.     amoxicillin-clavulanate 500-125 MG per tablet  Commonly known as:  AUGMENTIN  1 twice daily  For 10 days     antiseptic oral rinse Liqd  15 mLs by Mouth Rinse route 2 (two) times daily.     buPROPion 150 MG 12 hr tablet  Commonly known as:  WELLBUTRIN SR  Take 150 mg by mouth 2 (two) times  daily.     diltiazem 240 MG 24 hr capsule  Commonly known as:  CARDIZEM CD  take 1 capsule by mouth once daily     EPIPEN 0.3 mg/0.3 mL Devi  Generic drug:  EPINEPHrine  Inject 0.3 mg into the muscle as needed. For allergic reactions     flecainide 50 MG tablet  Commonly known as:  TAMBOCOR  take 1 tablet by mouth every 12 hours      Fluticasone-Salmeterol 250-50 MCG/DOSE Aepb  Commonly known as:  ADVAIR  Inhale 1 puff into the lungs every 12 (twelve) hours.     guaiFENesin 600 MG 12 hr tablet  Commonly known as:  MUCINEX  Take 600 mg by mouth every 12 (twelve) hours as needed. For congestion     ipratropium-albuterol 0.5-2.5 (3) MG/3ML Soln  Commonly known as:  DUONEB  Take 3 mLs by nebulization every 6 (six) hours as needed. For shortness of breath     levothyroxine 75 MCG tablet  Commonly known as:  SYNTHROID, LEVOTHROID  Take 75 mcg by mouth daily.     montelukast 10 MG tablet  Commonly known as:  SINGULAIR  Take 1 tablet (10 mg total) by mouth at bedtime.     NON FORMULARY  CPAP     NON FORMULARY  Oxygen 2-4 LPM     nystatin 100000 UNIT/ML suspension  Commonly known as:  MYCOSTATIN  Take 5 mLs by mouth 3 (three) times daily as needed (pain).     omalizumab 150 MG injection  Commonly known as:  XOLAIR  Inject 225 mg into the skin every 14 (fourteen) days. Friday     oxyCODONE-acetaminophen 5-325 MG per tablet  Commonly known as:  PERCOCET/ROXICET  Take 1-2 tablets by mouth every 6 (six) hours as needed for severe pain (pain).     predniSONE 10 MG tablet  Commonly known as:  DELTASONE  Take 4 tabs daily x 2 days, 3 tabs daily x 2 days, 2 tabs daily x 2 days, then continue with 1 tab daily.     risedronate 35 MG tablet  Commonly known as:  ACTONEL  Take 35 mg by mouth every 7 (seven) days. with water on empty stomach, nothing by mouth or lie down for next 30 minutes; takes on Saturdays     theophylline 300 MG 12 hr tablet  Commonly known as:  THEODUR  Take 0.5 tablets (150 mg total) by mouth 2 (two) times daily.     tiotropium 18 MCG inhalation capsule  Commonly known as:  SPIRIVA  Place 18 mcg into inhaler and inhale daily.     traMADol 50 MG tablet  Commonly known as:  ULTRAM  Take 1 tablet by mouth 4 (four) times daily as needed (pain).     venlafaxine 37.5 MG tablet  Commonly known  as:  EFFEXOR  37.5 mg. Take one time daily     warfarin 2 MG tablet  Commonly known as:  COUMADIN  Take 2 mg by mouth every evening.     zolpidem 5 MG tablet  Commonly known as:  AMBIEN  Take 2.5 mg by mouth at bedtime as needed. For sleep           Follow-up Information   Follow up with Marianna Payment, MD In 4 weeks.   Specialty:  Orthopedic Surgery   Contact information:   [address] [place] [address] [phone]     discharge planning time total- 45 min.  Signed: Denton Ar  Roslyn Else 02/09/2014, 9:48 PM

## 2014-02-09 NOTE — Progress Notes (Signed)
PT Cancellation Note  Patient Details Name: [name] MRN: [id number] DOB: 09/18/1936   Cancelled Treatment:    Reason Eval/Treat Not Completed: Medical issues which prohibited therapy. Noted pt had a xrays and CT scan of lumbar spine that revealed T12 compression fracture, L5 Rt transverse process fracture, and bil sacral fractures (in addition to previously known bil pubic rami fractures).  Await clarification from Orthopedics MD re: weight-bearing status (previous order for WBAT was written prior to known above fractures). Left message with [employer].    02/09/2014 Barry Brunner, PT Pager: [phone]       Jeanie Cooks Amiel Mccaffrey 02/09/2014, 11:41 AM

## 2014-02-09 NOTE — Progress Notes (Signed)
ANTICOAGULATION CONSULT NOTE - Follow Up Consult  Pharmacy Consult for coumadin Indication: atrial fibrillation  Allergies  Allergen Reactions  . Clarithromycin     REACTION: nausea    Patient Measurements: Height: 5\' 5"  (165.1 cm) Weight: 142 lb (64.411 kg) IBW/kg (Calculated) : 57  Vital Signs: Temp: 97.4 F (36.3 C) (05/25 0526) Temp src: Axillary (05/25 0526) BP: 134/61 mmHg (05/25 0526) Pulse Rate: 92 (05/25 0526)  Labs:  Recent Labs  02/07/14 0400 02/08/14 0355 02/09/14 0310  HGB 10.1*  --   --   HCT 32.5*  --   --   PLT 259  --   --   LABPROT 21.9* 25.7* 29.3*  INR 1.98* 2.44* 2.90*  CREATININE 1.23*  --   --     Estimated Creatinine Clearance: 35.6 ml/min (by C-G formula based on Cr of 1.23).   Medications:  Scheduled:  . antiseptic oral rinse  15 mL Mouth Rinse BID  . buPROPion  150 mg Oral BID  . ceFEPime (MAXIPIME) IV  1 g Intravenous Q24H  . dextromethorphan-guaiFENesin  1 tablet Oral BID  . diltiazem  240 mg Oral Daily  . flecainide  50 mg Oral Q12H  . levothyroxine  75 mcg Oral QAC breakfast  . mometasone-formoterol  2 puff Inhalation BID  . montelukast  10 mg Oral QHS  . predniSONE  40 mg Oral Q breakfast  . theophylline  150 mg Oral BID  . tiotropium  18 mcg Inhalation Daily  . venlafaxine XR  37.5 mg Oral Q breakfast  . Warfarin - Pharmacist Dosing Inpatient   Does not apply q1800    Assessment: 78 yo female on coumadin PTA for afib, and pharmacy has been consulted to manage coumadin therapy. INR remains therapeutic today at 2.9, but at the high end of the goal range and significantly increased after only receiving half of her normal home dose last night. Given significant increase in INR, will hold tonight's dose to avoid a supratherapeutic INR. Patient is not on many additional medications different from her home regimen that would increase suspicion of potential drug interactions. Dietary changes while inpatient could be a contributing  factor.     Hgb/pltc stable as of 5/23. No CBC ordered for today. No bleeding issues noted.   Goal of Therapy:  INR 2-3 Monitor platelets by anticoagulation protocol: Yes   Plan:  -Hold tonight's coumadin -Daily PT/INR  Ahren Pettinger C. Shanyn Preisler, PharmD Clinical Pharmacist-Resident Pager: [phone] Pharmacy: [phone] 02/09/2014 8:24 AM

## 2014-02-09 NOTE — Progress Notes (Signed)
Pt having expiratory wheezing, difficulty breathing, sats in 80's on 4L nasal canula. Scheduled Dulera and PRN albuterol given. Respiratory evaluated patient as well. Patient still having some difficulty breathing. Dr. Maxwell Caul notified and orders given for 1 x dose of IV lasix 40 mg. Will continue to monitor patient.

## 2014-02-10 LAB — PROTIME-INR
INR: 2.2 — ABNORMAL HIGH (ref 0.00–1.49)
Prothrombin Time: 23.7 seconds — ABNORMAL HIGH (ref 11.6–15.2)

## 2014-02-10 MED ORDER — MAGNESIUM HYDROXIDE 400 MG/5ML PO SUSP: 30.0000 mL | Freq: Every day | ORAL | Status: DC | PRN

## 2014-02-10 MED ORDER — MORPHINE SULFATE ER 15 MG PO TBCR: 15.0000 mg | EXTENDED_RELEASE_TABLET | Freq: Two times a day (BID) | ORAL | Status: DC

## 2014-02-10 MED ORDER — MORPHINE SULFATE ER 15 MG PO TBCR
15.0000 mg | EXTENDED_RELEASE_TABLET | Freq: Two times a day (BID) | ORAL | Status: DC
  Administered 2014-02-10: 15 mg via ORAL
  Filled 2014-02-10: qty 1

## 2014-02-10 MED ORDER — OXYCODONE-ACETAMINOPHEN 5-325 MG PO TABS: 1.0000 | ORAL_TABLET | Freq: Four times a day (QID) | ORAL | Status: DC | PRN

## 2014-02-10 MED ORDER — METHOCARBAMOL 500 MG PO TABS
500.0000 mg | ORAL_TABLET | Freq: Four times a day (QID) | ORAL | Status: DC | PRN
  Filled 2014-02-10: qty 1

## 2014-02-10 MED ORDER — WARFARIN SODIUM 1 MG PO TABS
1.5000 mg | ORAL_TABLET | Freq: Once | ORAL | Status: DC
  Filled 2014-02-10: qty 1

## 2014-02-10 MED ORDER — ALBUTEROL SULFATE (2.5 MG/3ML) 0.083% IN NEBU: 2.5000 mg | INHALATION_SOLUTION | RESPIRATORY_TRACT | Status: DC | PRN

## 2014-02-10 MED ORDER — METHOCARBAMOL 500 MG PO TABS: 500.0000 mg | ORAL_TABLET | Freq: Four times a day (QID) | ORAL | Status: DC | PRN

## 2014-02-10 MED ORDER — CYCLOBENZAPRINE HCL 10 MG PO TABS
10.0000 mg | ORAL_TABLET | Freq: Four times a day (QID) | ORAL | Status: DC | PRN
  Administered 2014-02-10: 10 mg via ORAL
  Filled 2014-02-10: qty 1

## 2014-02-10 MED ORDER — ALPRAZOLAM 0.25 MG PO TABS: 0.2500 mg | ORAL_TABLET | Freq: Three times a day (TID) | ORAL | Status: DC | PRN

## 2014-02-10 MED ORDER — TRAMADOL HCL 50 MG PO TABS: 50.0000 mg | ORAL_TABLET | Freq: Four times a day (QID) | ORAL | Status: DC | PRN

## 2014-02-10 MED ORDER — KETOROLAC TROMETHAMINE 15 MG/ML IJ SOLN
15.0000 mg | Freq: Once | INTRAMUSCULAR | Status: AC
  Administered 2014-02-10: 15 mg via INTRAVENOUS
  Filled 2014-02-10: qty 1

## 2014-02-10 MED ORDER — IPRATROPIUM-ALBUTEROL 0.5-2.5 (3) MG/3ML IN SOLN: 3.0000 mL | Freq: Four times a day (QID) | RESPIRATORY_TRACT | Status: AC

## 2014-02-10 MED ORDER — SENNOSIDES-DOCUSATE SODIUM 8.6-50 MG PO TABS: 1.0000 | ORAL_TABLET | Freq: Every day | ORAL | Status: DC

## 2014-02-10 NOTE — Progress Notes (Signed)
Patient may WBAT to BLE.

## 2014-02-10 NOTE — Progress Notes (Signed)
CSW (Clinical Education officer, museum) prepared pt dc packet and placed with shadow chart. CSW arranged non-emergent ambulance transport for 2pm. Pt, pt family, pt nurse, and facility informed. CSW signing off.   [place], [place]

## 2014-02-10 NOTE — Progress Notes (Signed)
Patient c/o spasms and little relief of pain, with difficulty taking full breath due to pain. Dr.Osborne made aware, orders given to change frequency of flexeril and albuterol PRN doses. Will continue to monitor patient.

## 2014-02-10 NOTE — Progress Notes (Signed)
1157 02-10-14  Medicare IM signed by patient and signed copy left on chart. Ocie Cornfield [place], RN,BSN [phone]

## 2014-02-10 NOTE — Progress Notes (Signed)
ANTICOAGULATION CONSULT NOTE - Follow Up Consult  Pharmacy Consult:  Coumadin Indication: atrial fibrillation  Allergies  Allergen Reactions  . Clarithromycin     REACTION: nausea    Patient Measurements: Height: 5\' 5"  (165.1 cm) Weight: 141 lb 11.6 oz (64.287 kg) IBW/kg (Calculated) : 57  Vital Signs: Temp: 97.7 F (36.5 C) (05/26 0554) Temp src: Axillary (05/26 0554) BP: 124/61 mmHg (05/26 0554) Pulse Rate: 92 (05/26 0554)  Labs:  Recent Labs  02/08/14 0355 02/09/14 0310 02/10/14 0518  LABPROT 25.7* 29.3* 23.7*  INR 2.44* 2.90* 2.20*    Estimated Creatinine Clearance: 35.6 ml/min (by C-G formula based on Cr of 1.23).     Assessment: 55 YOF continues on Coumadin for history of Afib.  Patient appears to be very sensitive to Coumadin.  INR decreased significantly today but remains therapeutic.  No bleeding reported.    Goal of Therapy:  INR 2-3 Monitor platelets by anticoagulation protocol: Yes    Plan:  - Coumadin 1.5mg  PO today - Daily PT / INR - Continue Cefepime 1gm IV Q24H (note due 5/27), f/u LOT - BMET in AM if still here - Consider starting a bowel regimen as patient is on scheduled pain med    Cynia Abruzzo D. Mina Marble, PharmD, BCPS Pager:  [phone] 02/10/2014, 10:00 AM

## 2014-02-10 NOTE — Progress Notes (Addendum)
Patient still complaining of no relief from pain, with difficulty taking deep breaths, o2 sats fluctuating in the 80's. Dr. Maxwell Caul made aware, orders given for 1 x dose 15 mg Toradol IV.Will continue to monitor patient.

## 2014-02-10 NOTE — Progress Notes (Signed)
Pt can place on home machine herself when ready. Pt encouraged to call RT if needing any assistance. RN aware.

## 2014-02-10 NOTE — Discharge Summary (Signed)
See D/c Summary for details- 02/10/14 Overnight - c/o pain back and spasms Pain meds adjusted Will need pain control and meds adjusted VSS Change antibiotic to po augmentin Ok for d/c to SNF [employer]

## 2014-02-17 ENCOUNTER — Ambulatory Visit: Payer: Medicare Other

## 2014-02-17 ENCOUNTER — Inpatient Hospital Stay: Payer: Medicare Other | Admitting: Adult Health

## 2014-02-20 ENCOUNTER — Ambulatory Visit (INDEPENDENT_AMBULATORY_CARE_PROVIDER_SITE_OTHER)
Admission: RE | Admit: 2014-02-20 | Discharge: 2014-02-20 | Disposition: A | Discharge status: Home / Self Care | Payer: Medicare Other | Source: Ambulatory Visit | Attending: Adult Health | Admitting: Adult Health

## 2014-02-20 ENCOUNTER — Encounter: Payer: Self-pay | Admitting: Adult Health

## 2014-02-20 ENCOUNTER — Ambulatory Visit (INDEPENDENT_AMBULATORY_CARE_PROVIDER_SITE_OTHER): Payer: Medicare Other

## 2014-02-20 ENCOUNTER — Ambulatory Visit (INDEPENDENT_AMBULATORY_CARE_PROVIDER_SITE_OTHER): Payer: Medicare Other | Admitting: Adult Health

## 2014-02-20 ENCOUNTER — Ambulatory Visit: Payer: Medicare Other

## 2014-02-20 VITALS — BP 112/78 | HR 100 | Temp 98.0°F | Ht 65.0 in

## 2014-02-20 DIAGNOSIS — J441 Chronic obstructive pulmonary disease with (acute) exacerbation: Secondary | ICD-10-CM

## 2014-02-20 DIAGNOSIS — J189 Pneumonia, unspecified organism: Secondary | ICD-10-CM

## 2014-02-20 DIAGNOSIS — J309 Allergic rhinitis, unspecified: Secondary | ICD-10-CM | POA: Diagnosis not present

## 2014-02-20 NOTE — Progress Notes (Signed)
Patient ID: [name], female    DOB: Dec 28, 1935, 78 y.o.   MRN: [id number]  HPI 75/12- 76 yo former smoker with severe COPD/ chronic hypoxic respiratory failure, hx lung nodule treated with XRT as presumptive Ca w/o bx. OSA  Husband here. Last here November 28, 2010. Since then was hosp for tachyarrythmia- considered for ablation. She was concerned about risk of being put to sleep and wanted to discuss it here.  Now feels sore at strap level around chest, very congested and wheezy. Nebulizer helps but needing frequently. Malaise. Sputum white, no fever. Feet feel still a little swollen. Continues prednisone 5 mg every other day.   06/02/11- 35 yo former smoker with severe COPD/ chronic hypoxic respiratory failure, hx lung nodule treated with XRT as presumptive Ca w/o bx. OSA  Doing better since last visit. Less drainage and less cough. Breathing comfortable at rest on room air but not with any exertion. Theophylline level low 03/15/11- not likely to contribute to irregular heart beat.  Continues O2, low dose prednisone and Xolair.   10/03/11-  31 yo former smoker with severe COPD/ chronic hypoxic respiratory failure, hx lung nodule treated with XRT as presumptive Ca w/o bx, OSA Husband here. More increased SOB than usual and pain in back and chest area over past 2-3 days(recently had flu) Heart rate went up to 150s an hour or two after pain started. Had to double up on ditiazem due to increased heart palpitation-CP stopped once med taken. She reported this to her cardiology office, who referred her here. Today she feels back to normal, just tired.   Sleep Apnea -Wears CPAP for approx 6-8 hours at night.  11/10/11- 47 yo former smoker with severe COPD/ chronic hypoxic respiratory failure, hx lung nodule treated with XRT as presumptive Ca w/o bx, OSA Husband here. Acute visit- C/O: pain in upper back and around sides(pain moves); consistent pain but more intense with inhaling.    02/01/12-  3 yo  former smoker with severe COPD/ chronic hypoxic respiratory failure, hx lung nodule treated with XRT as presumptive Ca w/o bx, OSA    Husband here  Has to stop and rest more than usual; has turned O2 up to 3.5 and feels better In the past 2 or 3 weeks has had increased dyspnea on exertion with no abrupt change. Continues maintenance prednisone 5 mg daily. Uses nebulizer less than once per day. Some tachycardia palpitation and has a history of SVT. There has been some discussion of an ablation procedure. She continues allergy vaccine and feels that has helped her through the spring. CT 12/14/11 reviewed images with them. IMPRESSION:  Increasing paramediastinal nodularity along the medial left upper  lobe. While this may reflect fibrosis related to prior radiation,  given the history of XRT completion in 2011, the interval change is  worrisome. PET-CT is suggested for further evaluation.  Underlying moderate centrilobular and paraseptal emphysema with  stable bullous changes in the right lung apex.  These results will be called to the ordering clinician or  representative by the Radiologist Assistant, and communication  documented in the PACS Dashboard.  Original Report Authenticated By: Julian Hy, M.D.    05/09/12- 89 yo former smoker with severe COPD/ chronic hypoxic respiratory failure, hx lung nodule treated with XRT as presumptive Ca w/o bx, OSA ., Hx SVT    Recent acute illness: Slight fever, SOB, and cough. Has sinus flare up , with nausea, myalgias, nasal congestion and watery rhinorrhea. Sister had  similar illness. Still on prednisone 5 mg daily and oxygen 3.5 L/Advanced.  History of SVT and notices occasional tachycardia palpitation. Nodular prominence left upper lobe suspicious for tumor recurrence. She has followup chest CT and appointment pending with Dr. Tammi Klippel Rad.Onc. OSA/ CPAP doing well with no changes or concerns. PET 02/13/12- images reviewed with her IMPRESSION:   Continued progression of abnormal soft tissue/nodularity in the  medial left upper lung, now measuring approximately 2.6 x 1.5 cm,  mildly FDG avid with max SUV 4.4. This appearance is suspicious  for tumor recurrence.  No evidence of metastatic disease.  Original Report Authenticated By: Julian Hy, M.D.    09/09/12- 77 yo former smoker with severe COPD/ chronic hypoxic respiratory failure, hx lung nodule treated with XRT as presumptive Ca w/o bx, OSA ., Hx SVT FOLLOWS FOR: chest congestion(using Mucinex), wheezing, and more SOB than usual Ran out of [place] for 5 days and just restarted a week ago. Began again increased chest congestion. Some dry throat clearing and wheeze but no fever or sore throat. Had fallen and bruised her chest. Continues prednisone 5 mg daily Had ablation for heart rhythm-successful. Xolair continues to help. CT chest 09/02/12 reviewed with her IMPRESSION:  1. 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  2. Interval sclerosis and pathologic fracture of the manubrium.  This may represent blastic bone metastasis. Alternatively these  findings may represent osteonecrosis secondary to post radiation  therapy.  3. Stable appearance of the compression fractures and abnormal  increase sclerosis involving the T2 and T3 vertebra. 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 Cannot rule  out sclerotic bone metastasis with associated pathologic fractures.  4. Stable T12 compression fracture  Original Report Authenticated By: Kerby Moors, M.D.   03/10/13- 81 yo former smoker with severe COPD/ chronic hypoxic respiratory failure, hx lung nodule treated with XRT as presumptive Ca w/o bx, OSA ., Hx SVT FOLLOWS FOR: cough-productive started out yellow but now clear in color since last week-was given pred taper. SOB and wheezing-using nebulizer at home  around the clock. chest tightness and congestion. Fever last week as well. She had a cold last week and we called in prednisone taper. Slowly better. Fever gone. CT chest 03/06/13 IMPRESSION:  1. Stable left apical scarring/radiation changes. No CT findings  to suggest residual, recurrent or metastatic disease.  2. No mediastinal or hilar lymphadenopathy.  3. Stable advanced emphysematous changes.  4. Stable sclerotic bone lesions involving the manubrium, T2 and  T3 vertebral bodies.  5. Stable T12 compression fracture through  Original Report Authenticated By: Marijo Sanes, M.D.  10/23/13- 65 yo former smoker with severe COPD/ chronic hypoxic respiratory failure, hx lung nodule treated with XRT as presumptive Ca w/o bx, OSA ., Hx SVT FOLLOWS FOR:  as long as she is wearing her O2 and pacing herself she is doing well; continues to get Xolair and allergy injections. O2 3L/ concentrator Imogene Little phlegm  01/23/14- 21 yo former smoker with severe COPD/ chronic hypoxic respiratory failure, hx lung nodule treated with XRT as presumptive Ca w/o bx, OSA ., Hx SVT Acute OV for cough with green sputum for 2 days now thick white sputum, increase SOB, unbalanced with her walking, wheezing.   We sent pred taper recently. Continues 5 mg prednisone daily maintenance. She has been treating a cold back and forth with her husband. She again says that Xolair helps  her and is worth continuing. Currently denies fever or sore throat but feels weak. Sputum is no longer green but still thick with increased cough. Mucinex helps.  02/20/2014 [facility] follow up  Returns for a post hospital followup. She was admitted, may 18 through may 26, for a COPD, exacerbation, with possible healthcare associated pneumonia, multiple insufficiency fractures, secondary to severe osteoporosis. Patient had a fall. Prior to admission with notable, bilateral pelvic fractures, sacral fracture, T12 compression fracture. On admission.  She was treated with aggressive IV antibiotics, steroids, and nebulized bronchodilators. Chest x-ray did show increased interstitial markings versus infiltrate in the lower right lobe. She was discharged on a prednisone taper with transitioned to chronic steroids at 10 mg daily. Of note, patient was on 5 mg prior to admission. She was discharged on 10 day course of Augmentin, which she is now finished. She did require some initial BiPAP with transitioned over to oxygen with adequate  oxygen saturations. She was discharged to a rehabilitation center, currently doing better, however, she is very weak, and sore from her above fractures. She denies any flare in cough, wheezing. She denies nausea, vomiting, diarrhea. Appetite is adequate. She is here today with her daughter.    Review of Systems- see HPI Constitutional:   No weight loss, night sweats, fevers, chills, fatigue, lassitude. HEENT:   No headaches,  Difficulty swallowing,  Tooth/dental problems,  Sore throat,                No sneezing, itching, ear ache, CV:  No acute chest pain,  No-orthopnea, PND, swelling in lower extremities, anasarca, dizziness,               palpitations GI  No heartburn,   No-abdominal pain, nausea, vomiting,  Resp:     no- excess mucus, No coughing up of blood.  No change in color of mucus.  Skin: no rash or lesions. GU:  MS: + hip and back pain  Psych:  No change in mood or affect. No depression or anxiety.  No memory loss.    Objective:   Physical Exam  General- Alert, Oriented, Affect-appropriate, Distress- none acute   O2 , in wheelchair  Skin- rash-none, lesions- none, excoriation- none Lymphadenopathy- none Head- atraumatic            Eyes- Gross vision intact, PERRLA, conjunctivae clear secretions            Ears- Hearing, canals            Nose-  Septal dev,  polyps, erosion, perforation. Chronic nasal quality to speech.              Throat- Mallampati II , mucosa clear , drainage- none, tonsils-  atrophic Neck- flexible , trachea midline, no stridor , thyroid nl, carotid no bruit Chest - symmetrical excursion , unlabored           Heart/CV- RRR , no murmur , no gallop  , no rub, nl s1 s2                           - JVD- none , edema- none, stasis changes- none, varices- none           Lung- few rhonchi , dullness-none,                     rub- none, unlabored           Chest wall-  Abd-  Br/ Gen/ Rectal- Not done, not indicated Extrem- cyanosis- none, clubbing, none, atrophy- none, strength- nl, +wheelchair Neuro- grossly intact to observation    CXR 02/20/2014 Chronic changes

## 2014-02-20 NOTE — Patient Instructions (Signed)
Continue on current regimen  Remain on Prednisone 10mg  daily  follow up Dr. Annamaria Boots  In 4-6 weeks and As needed   Please contact office for sooner follow up if symptoms do not improve or worsen or seek emergency care

## 2014-02-20 NOTE — Assessment & Plan Note (Signed)
Resolving flare -keep prednisone 10mg  daily until return    Plan  Continue on current regimen  Remain on Prednisone 10mg  daily  follow up Dr. Annamaria Boots  In 4-6 weeks and As needed   Please contact office for sooner follow up if symptoms do not improve or worsen or seek emergency care

## 2014-02-20 NOTE — Assessment & Plan Note (Signed)
cxr today with chronic changes, PNA resolved on abx

## 2014-02-23 ENCOUNTER — Encounter (INDEPENDENT_AMBULATORY_CARE_PROVIDER_SITE_OTHER): Payer: Self-pay

## 2014-02-23 ENCOUNTER — Encounter: Payer: Self-pay | Admitting: Interventional Cardiology

## 2014-02-23 ENCOUNTER — Telehealth: Payer: Self-pay | Admitting: Pharmacist

## 2014-02-23 ENCOUNTER — Ambulatory Visit (INDEPENDENT_AMBULATORY_CARE_PROVIDER_SITE_OTHER): Payer: Medicare Other | Admitting: Interventional Cardiology

## 2014-02-23 VITALS — BP 117/57 | HR 104 | Ht 65.0 in

## 2014-02-23 DIAGNOSIS — R079 Chest pain, unspecified: Secondary | ICD-10-CM

## 2014-02-23 DIAGNOSIS — I4892 Unspecified atrial flutter: Secondary | ICD-10-CM

## 2014-02-23 NOTE — Progress Notes (Signed)
Patient ID: [name], female   DOB: 07/29/1936, 78 y.o.   MRN: [id number]    [address], [address] [place], [name]  [address] Phone: [phone] Fax:  [phone]  Date:  02/23/2014   ID:  [name], DOB 1936/03/26, MRN [id number]  PCP:  Wenda Low, MD      History of Present Illness: [name] is a 78 y.o. female who had an AFlutter ablation. She had some residual AFib and has been managed on Flecainide.  Still on home O2 at 3L/min   Has had a lot of dizziness. [place] and broke her 3 vertebrae in her pelvis about 3 weeks ago.  Doing rehab at nursing home. SHOB is worsening. She reports sharp, intermittent,  chest pain, radiate to back x since the fall, worse with inspiration. Pain last about an hour. Occurs once a week. Rates pain at 8/10.  Has fallen about 5 times this past year prior to this most severe fall.  She denies any lower extremity edema. She has had some confusion. She has been managed with pain medicines. Surgery is not required.   Wt Readings from Last 3 Encounters:  02/10/14 141 lb 11.6 oz (64.287 kg)  01/23/14 145 lb 6.4 oz (65.953 kg)  10/23/13 146 lb 6.4 oz (66.407 kg)     Past Medical History  Diagnosis Date  . Unspecified sinusitis (chronic)   . Obstructive sleep apnea   . COPD (chronic obstructive pulmonary disease)     on home O2, chronic steroid use  . Asthma   . Lung nodule   . SVT (supraventricular tachycardia)   . Atrial flutter     typical appearing  . Hypothyroidism   . GERD (gastroesophageal reflux disease)   . Anxiety   . Depression   . History of radiation therapy 11/08/2009-11/18/2009    left upper lung  . Allergy   . Cancer     lung ca s/p XRT 2011  . Lung cancer     non-small cell ca let upper lung stage IA    Current Outpatient Prescriptions  Medication Sig Dispense Refill  . acetaminophen (TYLENOL) 325 MG tablet Take 2 tablets (650 mg total) by mouth every 6 (six) hours as needed for mild pain.  30 tablet  2    . albuterol (PROVENTIL HFA;VENTOLIN HFA) 108 (90 BASE) MCG/ACT inhaler Inhale 2 puffs into the lungs every 6 (six) hours as needed. For shortness of breath      . ALPRAZolam (XANAX) 0.25 MG tablet Take 1 tablet (0.25 mg total) by mouth every 8 (eight) hours as needed for anxiety.  30 tablet  0  . antiseptic oral rinse (BIOTENE) LIQD 15 mLs by Mouth Rinse route 2 (two) times daily.  120 mL  2  . buPROPion (WELLBUTRIN SR) 150 MG 12 hr tablet Take 150 mg by mouth 2 (two) times daily.       Marland Kitchen diltiazem (CARDIZEM CD) 240 MG 24 hr capsule take 1 capsule by mouth once daily  30 capsule  6  . EPINEPHrine (EPIPEN) 0.3 mg/0.3 mL DEVI Inject 0.3 mg into the muscle as needed. For allergic reactions      . flecainide (TAMBOCOR) 50 MG tablet take 1 tablet by mouth every 12 hours  60 tablet  5  . Fluticasone-Salmeterol (ADVAIR) 250-50 MCG/DOSE AEPB Inhale 1 puff into the lungs every 12 (twelve) hours.  60 each  6  . guaiFENesin (MUCINEX) 600 MG 12 hr tablet Take 600 mg  by mouth every 12 (twelve) hours as needed. For congestion      . ipratropium-albuterol (DUONEB) 0.5-2.5 (3) MG/3ML SOLN Take 3 mLs by nebulization 4 (four) times daily. For shortness of breath And Q4hrs prn  360 mL  11  . levothyroxine (SYNTHROID, LEVOTHROID) 75 MCG tablet Take 75 mcg by mouth daily.      . magnesium hydroxide (MILK OF MAGNESIA) 400 MG/5ML suspension Take 30 mLs by mouth daily as needed for mild constipation.  360 mL  0  . methocarbamol (ROBAXIN) 500 MG tablet Take 1 tablet (500 mg total) by mouth every 6 (six) hours as needed for muscle spasms.  30 tablet  3  . montelukast (SINGULAIR) 10 MG tablet Take 1 tablet (10 mg total) by mouth at bedtime.  30 tablet  5  . morphine (MS CONTIN) 15 MG 12 hr tablet Take 1 tablet (15 mg total) by mouth every 12 (twelve) hours.  30 tablet  0  . NON FORMULARY CPAP       . NON FORMULARY Oxygen 2-4 LPM      . nystatin (MYCOSTATIN) 100000 UNIT/ML suspension Take 5 mLs by mouth 3 (three) times  daily as needed (pain).      Marland Kitchen omalizumab (XOLAIR) 150 MG injection Inject 225 mg into the skin every 14 (fourteen) days. Friday      . oxyCODONE-acetaminophen (PERCOCET/ROXICET) 5-325 MG per tablet Take 1-2 tablets by mouth every 6 (six) hours as needed for severe pain (pain).  60 tablet  0  . predniSONE (DELTASONE) 10 MG tablet Take 10 mg by mouth daily with breakfast.      . risedronate (ACTONEL) 35 MG tablet Take 35 mg by mouth every 7 (seven) days. with water on empty stomach, nothing by mouth or lie down for next 30 minutes; takes on Saturdays      . senna-docusate (SENOKOT-S) 8.6-50 MG per tablet Take 1 tablet by mouth at bedtime.  30 tablet  0  . theophylline (THEODUR) 300 MG 12 hr tablet Take 0.5 tablets (150 mg total) by mouth 2 (two) times daily.  30 tablet  5  . tiotropium (SPIRIVA) 18 MCG inhalation capsule Place 18 mcg into inhaler and inhale daily.      . traMADol (ULTRAM) 50 MG tablet Take 1 tablet (50 mg total) by mouth 4 (four) times daily as needed (pain).  30 tablet  0  . venlafaxine (EFFEXOR) 37.5 MG tablet 37.5 mg. Take one time daily      . warfarin (COUMADIN) 2 MG tablet Take 2 mg by mouth every evening.      . zolpidem (AMBIEN) 5 MG tablet Take 2.5 mg by mouth at bedtime as needed. For sleep       No current facility-administered medications for this visit.    Allergies:    Allergies  Allergen Reactions  . Clarithromycin     REACTION: nausea    Social History:  The patient  reports that she quit smoking about 27 years ago. Her smoking use included Cigarettes. She has a 80 pack-year smoking history. She has never used smokeless tobacco. She reports that she does not drink alcohol or use illicit drugs.   Family History:  The patient's family history includes Heart disease (age of onset: 56) in her mother.   ROS:  Please see the history of present illness.  No nausea, vomiting.  No fevers, chills.  No focal weakness.  No dysuria.  All other systems reviewed and  negative.   PHYSICAL  EXAM: VS:  BP 117/57  Pulse 104  Ht 5\' 5"  (1.651 m) Well nourished, well developed, in no acute distress HEENT: normal Neck: no JVD, no carotid bruits Cardiac:  normal S1, S2; RRR;  Lungs:  clear to auscultation bilaterally, no wheezing, rhonchi or rales Abd: soft, nontender, no hepatomegaly Ext: no edema Skin: warm and dry Neuro:   no focal abnormalities noted  EKG:  Sinus tachycardia, Q wave in lead 3, nonspecific ST segment changes laterally     ASSESSMENT AND PLAN:  Atrial flutter/atrial fibrillation  Notes: currently in sinus rhythm post ablation. Flecainide on board to prevent atrial fibrillation.  She did have some paroxysmal atrial fibrillation post ablation.  2. Elevated blood pressure reading without diagnosis of hypertension   elevated in the doctor's office in the past. Controlled today. 3. chest pain: I think is most likely related to her injury. It is more pleuritic sounding. She is not in any condition to undergo any type of cardiac evaluation at this time. Her biggest priority is to get better from her severe orthopedic injuries.   Signed, Mina Marble, MD, [facility] 02/23/2014 2:51 PM

## 2014-02-23 NOTE — Patient Instructions (Signed)
Your physician recommends that you continue on your current medications as directed. Please refer to the Current Medication list given to you today.  Your physician wants you to follow-up in: 1 year with Dr. Varanasi. You will receive a reminder letter in the mail two months in advance. If you don't receive a letter, please call our office to schedule the follow-up appointment.  

## 2014-02-23 NOTE — Telephone Encounter (Signed)
Patient is having her coumadin managed at [facility] currently since her fall last month.  She and her husband will call once she has been discharged so we can start managing her protimes here again at that time.  Patient and husband agreeable to this.

## 2014-03-01 ENCOUNTER — Inpatient Hospital Stay (HOSPITAL_COMMUNITY)
Admission: EM | Admit: 2014-03-01 | Discharge: 2014-03-04 | DRG: 193 | Disposition: A | Discharge status: Skilled Nursing Facility | Payer: Medicare Other | Attending: Internal Medicine | Admitting: Internal Medicine

## 2014-03-01 ENCOUNTER — Emergency Department (HOSPITAL_COMMUNITY): Payer: Medicare Other

## 2014-03-01 ENCOUNTER — Encounter (HOSPITAL_COMMUNITY): Payer: Self-pay | Admitting: Emergency Medicine

## 2014-03-01 DIAGNOSIS — G929 Unspecified toxic encephalopathy: Secondary | ICD-10-CM | POA: Diagnosis present

## 2014-03-01 DIAGNOSIS — G92 Toxic encephalopathy: Secondary | ICD-10-CM | POA: Diagnosis present

## 2014-03-01 DIAGNOSIS — I4892 Unspecified atrial flutter: Secondary | ICD-10-CM | POA: Diagnosis present

## 2014-03-01 DIAGNOSIS — G4733 Obstructive sleep apnea (adult) (pediatric): Secondary | ICD-10-CM | POA: Diagnosis present

## 2014-03-01 DIAGNOSIS — J9610 Chronic respiratory failure, unspecified whether with hypoxia or hypercapnia: Secondary | ICD-10-CM

## 2014-03-01 DIAGNOSIS — R4182 Altered mental status, unspecified: Secondary | ICD-10-CM

## 2014-03-01 DIAGNOSIS — Z87891 Personal history of nicotine dependence: Secondary | ICD-10-CM | POA: Diagnosis not present

## 2014-03-01 DIAGNOSIS — Z87311 Personal history of (healed) other pathological fracture: Secondary | ICD-10-CM

## 2014-03-01 DIAGNOSIS — Z881 Allergy status to other antibiotic agents status: Secondary | ICD-10-CM | POA: Diagnosis not present

## 2014-03-01 DIAGNOSIS — I498 Other specified cardiac arrhythmias: Secondary | ICD-10-CM | POA: Diagnosis present

## 2014-03-01 DIAGNOSIS — D649 Anemia, unspecified: Secondary | ICD-10-CM | POA: Diagnosis present

## 2014-03-01 DIAGNOSIS — R911 Solitary pulmonary nodule: Secondary | ICD-10-CM | POA: Diagnosis present

## 2014-03-01 DIAGNOSIS — IMO0002 Reserved for concepts with insufficient information to code with codable children: Secondary | ICD-10-CM

## 2014-03-01 DIAGNOSIS — J441 Chronic obstructive pulmonary disease with (acute) exacerbation: Secondary | ICD-10-CM | POA: Diagnosis present

## 2014-03-01 DIAGNOSIS — J189 Pneumonia, unspecified organism: Principal | ICD-10-CM | POA: Diagnosis present

## 2014-03-01 DIAGNOSIS — F05 Delirium due to known physiological condition: Secondary | ICD-10-CM | POA: Diagnosis present

## 2014-03-01 DIAGNOSIS — J45901 Unspecified asthma with (acute) exacerbation: Secondary | ICD-10-CM

## 2014-03-01 DIAGNOSIS — F341 Dysthymic disorder: Secondary | ICD-10-CM | POA: Diagnosis present

## 2014-03-01 DIAGNOSIS — K219 Gastro-esophageal reflux disease without esophagitis: Secondary | ICD-10-CM | POA: Diagnosis present

## 2014-03-01 DIAGNOSIS — Z8249 Family history of ischemic heart disease and other diseases of the circulatory system: Secondary | ICD-10-CM | POA: Diagnosis not present

## 2014-03-01 DIAGNOSIS — Z923 Personal history of irradiation: Secondary | ICD-10-CM | POA: Diagnosis not present

## 2014-03-01 DIAGNOSIS — R791 Abnormal coagulation profile: Secondary | ICD-10-CM | POA: Diagnosis present

## 2014-03-01 DIAGNOSIS — Z85118 Personal history of other malignant neoplasm of bronchus and lung: Secondary | ICD-10-CM | POA: Diagnosis not present

## 2014-03-01 DIAGNOSIS — I4891 Unspecified atrial fibrillation: Secondary | ICD-10-CM | POA: Diagnosis present

## 2014-03-01 DIAGNOSIS — Z66 Do not resuscitate: Secondary | ICD-10-CM | POA: Diagnosis present

## 2014-03-01 DIAGNOSIS — E039 Hypothyroidism, unspecified: Secondary | ICD-10-CM | POA: Diagnosis present

## 2014-03-01 DIAGNOSIS — M810 Age-related osteoporosis without current pathological fracture: Secondary | ICD-10-CM | POA: Diagnosis present

## 2014-03-01 DIAGNOSIS — Z9981 Dependence on supplemental oxygen: Secondary | ICD-10-CM

## 2014-03-01 DIAGNOSIS — Z7983 Long term (current) use of bisphosphonates: Secondary | ICD-10-CM | POA: Diagnosis not present

## 2014-03-01 DIAGNOSIS — E876 Hypokalemia: Secondary | ICD-10-CM | POA: Diagnosis present

## 2014-03-01 DIAGNOSIS — Z9089 Acquired absence of other organs: Secondary | ICD-10-CM | POA: Diagnosis not present

## 2014-03-01 DIAGNOSIS — J439 Emphysema, unspecified: Secondary | ICD-10-CM | POA: Diagnosis present

## 2014-03-01 DIAGNOSIS — Z7901 Long term (current) use of anticoagulants: Secondary | ICD-10-CM

## 2014-03-01 DIAGNOSIS — J329 Chronic sinusitis, unspecified: Secondary | ICD-10-CM | POA: Diagnosis present

## 2014-03-01 LAB — COMPREHENSIVE METABOLIC PANEL
ALK PHOS: 298 U/L — AB (ref 39–117)
ALT: 42 U/L — AB (ref 0–35)
AST: 61 U/L — ABNORMAL HIGH (ref 0–37)
Albumin: 2.6 g/dL — ABNORMAL LOW (ref 3.5–5.2)
BILIRUBIN TOTAL: 0.5 mg/dL (ref 0.3–1.2)
BUN: 39 mg/dL — AB (ref 6–23)
CHLORIDE: 100 meq/L (ref 96–112)
CO2: 35 meq/L — AB (ref 19–32)
Calcium: 9.9 mg/dL (ref 8.4–10.5)
Creatinine, Ser: 0.82 mg/dL (ref 0.50–1.10)
GFR calc non Af Amer: 68 mL/min — ABNORMAL LOW (ref >90)
GFR, EST AFRICAN AMERICAN: 79 mL/min — AB (ref >90)
Glucose, Bld: 124 mg/dL — ABNORMAL HIGH (ref 70–99)
POTASSIUM: 4.5 meq/L (ref 3.7–5.3)
SODIUM: 146 meq/L (ref 137–147)
Total Protein: 6.1 g/dL (ref 6.0–8.3)

## 2014-03-01 LAB — MRSA PCR SCREENING: MRSA BY PCR: POSITIVE — AB

## 2014-03-01 LAB — TROPONIN I: Troponin I: <0.3 ng/mL (ref <0.30)

## 2014-03-01 LAB — CBC
HCT: 29.4 % — ABNORMAL LOW (ref 36.0–46.0)
HEMOGLOBIN: 9.2 g/dL — AB (ref 12.0–15.0)
MCH: 29.4 pg (ref 26.0–34.0)
MCHC: 31.3 g/dL (ref 30.0–36.0)
MCV: 93.9 fL (ref 78.0–100.0)
PLATELETS: 278 10*3/uL (ref 150–400)
RBC: 3.13 MIL/uL — ABNORMAL LOW (ref 3.87–5.11)
RDW: 16.6 % — ABNORMAL HIGH (ref 11.5–15.5)
WBC: 12.1 10*3/uL — AB (ref 4.0–10.5)

## 2014-03-01 LAB — I-STAT CG4 LACTIC ACID, ED: LACTIC ACID, VENOUS: 1.47 mmol/L (ref 0.5–2.2)

## 2014-03-01 LAB — CBC WITH DIFFERENTIAL/PLATELET
Band Neutrophils: 0 % (ref 0–10)
Basophils Absolute: 0 10*3/uL (ref 0.0–0.1)
Basophils Relative: 0 % (ref 0–1)
Blasts: 0 %
Eosinophils Absolute: 0 10*3/uL (ref 0.0–0.7)
Eosinophils Relative: 0 % (ref 0–5)
HCT: 15.7 % — ABNORMAL LOW (ref 36.0–46.0)
HEMOGLOBIN: 5.1 g/dL — AB (ref 12.0–15.0)
LYMPHS ABS: 2.3 10*3/uL (ref 0.7–4.0)
Lymphocytes Relative: 11 % — ABNORMAL LOW (ref 12–46)
MCH: 30.4 pg (ref 26.0–34.0)
MCHC: 32.5 g/dL (ref 30.0–36.0)
MCV: 93.5 fL (ref 78.0–100.0)
Metamyelocytes Relative: 4 %
Monocytes Absolute: 0.4 10*3/uL (ref 0.1–1.0)
Monocytes Relative: 2 % — ABNORMAL LOW (ref 3–12)
Myelocytes: 3 %
NEUTROS ABS: 17.9 10*3/uL — AB (ref 1.7–7.7)
NEUTROS PCT: 80 % — AB (ref 43–77)
NRBC: 0 /100{WBCs}
PLATELETS: 572 10*3/uL — AB (ref 150–400)
PROMYELOCYTES ABS: 0 %
RBC: 1.68 MIL/uL — ABNORMAL LOW (ref 3.87–5.11)
RDW: 16.5 % — ABNORMAL HIGH (ref 11.5–15.5)
WBC: 20.6 10*3/uL — ABNORMAL HIGH (ref 4.0–10.5)

## 2014-03-01 LAB — TYPE AND SCREEN
ABO/RH(D): A POS
Antibody Screen: NEGATIVE

## 2014-03-01 LAB — ABO/RH: ABO/RH(D): A POS

## 2014-03-01 LAB — PROTIME-INR
INR: 7.16 (ref 0.00–1.49)
INR: 4.49 — AB (ref 0.00–1.49)
PROTHROMBIN TIME: 40.9 s — AB (ref 11.6–15.2)
Prothrombin Time: 58.4 seconds — ABNORMAL HIGH (ref 11.6–15.2)

## 2014-03-01 LAB — APTT: APTT: 64 s — AB (ref 24–37)

## 2014-03-01 LAB — POC OCCULT BLOOD, ED: Fecal Occult Bld: NEGATIVE

## 2014-03-01 MED ORDER — LEVOTHYROXINE SODIUM 75 MCG PO TABS
75.0000 ug | ORAL_TABLET | Freq: Every day | ORAL | Status: DC
  Administered 2014-03-01 – 2014-03-04 (×4): 75 ug via ORAL
  Filled 2014-03-01 (×5): qty 1

## 2014-03-01 MED ORDER — DILTIAZEM HCL ER COATED BEADS 240 MG PO CP24
240.0000 mg | ORAL_CAPSULE | Freq: Every day | ORAL | Status: DC
  Administered 2014-03-01 – 2014-03-04 (×4): 240 mg via ORAL
  Filled 2014-03-01 (×4): qty 1

## 2014-03-01 MED ORDER — CHLORHEXIDINE GLUCONATE CLOTH 2 % EX PADS
6.0000 | MEDICATED_PAD | Freq: Every day | CUTANEOUS | Status: DC
  Administered 2014-03-02 – 2014-03-04 (×3): 6 via TOPICAL

## 2014-03-01 MED ORDER — TRAMADOL HCL 50 MG PO TABS: 50.0000 mg | ORAL_TABLET | Freq: Four times a day (QID) | ORAL | Status: DC | PRN

## 2014-03-01 MED ORDER — MONTELUKAST SODIUM 10 MG PO TABS
10.0000 mg | ORAL_TABLET | Freq: Every day | ORAL | Status: DC
Start: 2014-03-01 — End: 2014-03-04
  Administered 2014-03-01 – 2014-03-03 (×3): 10 mg via ORAL
  Filled 2014-03-01 (×4): qty 1

## 2014-03-01 MED ORDER — ACETAMINOPHEN 325 MG PO TABS: 650.0000 mg | ORAL_TABLET | Freq: Four times a day (QID) | ORAL | Status: DC | PRN

## 2014-03-01 MED ORDER — PREDNISONE 20 MG PO TABS
40.0000 mg | ORAL_TABLET | Freq: Every day | ORAL | Status: DC
  Administered 2014-03-01: 40 mg via ORAL
  Filled 2014-03-01 (×3): qty 2

## 2014-03-01 MED ORDER — DEXTROSE 5 % IV SOLN: 1.0000 g | Freq: Three times a day (TID) | INTRAVENOUS | Status: DC

## 2014-03-01 MED ORDER — NYSTATIN 100000 UNIT/ML MT SUSP
5.0000 mL | Freq: Three times a day (TID) | OROMUCOSAL | Status: DC | PRN
  Administered 2014-03-01: 500000 [IU] via ORAL
  Filled 2014-03-01: qty 5

## 2014-03-01 MED ORDER — GUAIFENESIN ER 600 MG PO TB12
600.0000 mg | ORAL_TABLET | Freq: Two times a day (BID) | ORAL | Status: DC | PRN
  Administered 2014-03-01: 600 mg via ORAL
  Filled 2014-03-01: qty 1

## 2014-03-01 MED ORDER — BUPROPION HCL ER (SR) 150 MG PO TB12
150.0000 mg | ORAL_TABLET | Freq: Two times a day (BID) | ORAL | Status: DC
  Administered 2014-03-01 – 2014-03-04 (×7): 150 mg via ORAL
  Filled 2014-03-01 (×8): qty 1

## 2014-03-01 MED ORDER — TIOTROPIUM BROMIDE MONOHYDRATE 18 MCG IN CAPS
18.0000 ug | ORAL_CAPSULE | Freq: Every day | RESPIRATORY_TRACT | Status: DC
  Administered 2014-03-01: 18 ug via RESPIRATORY_TRACT
  Filled 2014-03-01: qty 5

## 2014-03-01 MED ORDER — SODIUM CHLORIDE 0.9 % IV SOLN
INTRAVENOUS | Status: DC
  Administered 2014-03-01: 22:00:00 via INTRAVENOUS

## 2014-03-01 MED ORDER — IPRATROPIUM-ALBUTEROL 0.5-2.5 (3) MG/3ML IN SOLN
3.0000 mL | Freq: Four times a day (QID) | RESPIRATORY_TRACT | Status: DC
  Administered 2014-03-01 (×2): 3 mL via RESPIRATORY_TRACT
  Filled 2014-03-01 (×2): qty 3

## 2014-03-01 MED ORDER — VENLAFAXINE HCL 37.5 MG PO TABS
37.5000 mg | ORAL_TABLET | Freq: Every day | ORAL | Status: DC
  Administered 2014-03-01 – 2014-03-04 (×4): 37.5 mg via ORAL
  Filled 2014-03-01 (×4): qty 1

## 2014-03-01 MED ORDER — THEOPHYLLINE ER 100 MG PO TB12
150.0000 mg | ORAL_TABLET | Freq: Two times a day (BID) | ORAL | Status: DC
  Administered 2014-03-01 – 2014-03-04 (×7): 150 mg via ORAL
  Filled 2014-03-01 (×2): qty 1.5
  Filled 2014-03-01: qty 1
  Filled 2014-03-01 (×3): qty 1.5
  Filled 2014-03-01: qty 1
  Filled 2014-03-01: qty 1.5

## 2014-03-01 MED ORDER — CEFEPIME HCL 1 G IJ SOLR
1.0000 g | Freq: Once | INTRAMUSCULAR | Status: DC
  Filled 2014-03-01: qty 1

## 2014-03-01 MED ORDER — MOMETASONE FURO-FORMOTEROL FUM 100-5 MCG/ACT IN AERO
2.0000 | INHALATION_SPRAY | Freq: Two times a day (BID) | RESPIRATORY_TRACT | Status: DC
  Administered 2014-03-01 – 2014-03-04 (×6): 2 via RESPIRATORY_TRACT
  Filled 2014-03-01: qty 8.8

## 2014-03-01 MED ORDER — MORPHINE SULFATE ER 15 MG PO TBCR
15.0000 mg | EXTENDED_RELEASE_TABLET | Freq: Two times a day (BID) | ORAL | Status: DC
  Administered 2014-03-01 – 2014-03-04 (×7): 15 mg via ORAL
  Filled 2014-03-01 (×7): qty 1

## 2014-03-01 MED ORDER — ALBUTEROL SULFATE HFA 108 (90 BASE) MCG/ACT IN AERS: 2.0000 | INHALATION_SPRAY | RESPIRATORY_TRACT | Status: DC | PRN

## 2014-03-01 MED ORDER — BIOTENE DRY MOUTH MT LIQD
15.0000 mL | Freq: Two times a day (BID) | OROMUCOSAL | Status: DC
  Administered 2014-03-01 – 2014-03-04 (×7): 15 mL via OROMUCOSAL

## 2014-03-01 MED ORDER — MUPIROCIN 2 % EX OINT
TOPICAL_OINTMENT | Freq: Two times a day (BID) | CUTANEOUS | Status: DC
  Administered 2014-03-01 – 2014-03-02 (×3): via NASAL
  Administered 2014-03-03: 1 via NASAL
  Administered 2014-03-03 – 2014-03-04 (×2): via NASAL
  Filled 2014-03-01: qty 22

## 2014-03-01 MED ORDER — SODIUM CHLORIDE 0.9 % IV SOLN
INTRAVENOUS | Status: AC
  Administered 2014-03-01: 07:00:00 via INTRAVENOUS

## 2014-03-01 MED ORDER — DEXTROSE 5 % IV SOLN
1.0000 g | Freq: Three times a day (TID) | INTRAVENOUS | Status: DC
  Administered 2014-03-01 – 2014-03-03 (×9): 1 g via INTRAVENOUS
  Filled 2014-03-01 (×12): qty 1

## 2014-03-01 MED ORDER — VANCOMYCIN HCL 500 MG IV SOLR
500.0000 mg | Freq: Two times a day (BID) | INTRAVENOUS | Status: DC
  Administered 2014-03-01 – 2014-03-04 (×6): 500 mg via INTRAVENOUS
  Filled 2014-03-01 (×7): qty 500

## 2014-03-01 MED ORDER — SODIUM CHLORIDE 0.9 % IV BOLUS (SEPSIS)
1000.0000 mL | Freq: Once | INTRAVENOUS | Status: AC
  Administered 2014-03-01: 1000 mL via INTRAVENOUS

## 2014-03-01 MED ORDER — IPRATROPIUM-ALBUTEROL 0.5-2.5 (3) MG/3ML IN SOLN
3.0000 mL | RESPIRATORY_TRACT | Status: DC
  Administered 2014-03-01 – 2014-03-03 (×10): 3 mL via RESPIRATORY_TRACT
  Filled 2014-03-01 (×11): qty 3

## 2014-03-01 MED ORDER — FLECAINIDE ACETATE 50 MG PO TABS
50.0000 mg | ORAL_TABLET | Freq: Two times a day (BID) | ORAL | Status: DC
  Administered 2014-03-01 – 2014-03-04 (×7): 50 mg via ORAL
  Filled 2014-03-01 (×8): qty 1

## 2014-03-01 MED ORDER — ALBUTEROL SULFATE (2.5 MG/3ML) 0.083% IN NEBU: 2.5000 mg | INHALATION_SOLUTION | RESPIRATORY_TRACT | Status: DC | PRN

## 2014-03-01 MED ORDER — PHYTONADIONE 5 MG PO TABS
2.5000 mg | ORAL_TABLET | Freq: Once | ORAL | Status: AC
  Administered 2014-03-01: 2.5 mg via ORAL
  Filled 2014-03-01: qty 1

## 2014-03-01 MED ORDER — OXYCODONE-ACETAMINOPHEN 5-325 MG PO TABS
1.0000 | ORAL_TABLET | Freq: Four times a day (QID) | ORAL | Status: DC | PRN
  Administered 2014-03-01: 1 via ORAL
  Administered 2014-03-03: 2 via ORAL
  Filled 2014-03-01: qty 2
  Filled 2014-03-01: qty 1

## 2014-03-01 MED ORDER — ALPRAZOLAM 0.25 MG PO TABS
0.2500 mg | ORAL_TABLET | Freq: Three times a day (TID) | ORAL | Status: DC | PRN
  Administered 2014-03-01 – 2014-03-04 (×7): 0.25 mg via ORAL
  Filled 2014-03-01 (×7): qty 1

## 2014-03-01 NOTE — ED Provider Notes (Signed)
Medical screening examination/treatment/procedure(s) were performed by non-physician practitioner and as supervising physician I was immediately available for consultation/collaboration.   EKG Interpretation None        Elyn Peers, MD 03/01/14 2312

## 2014-03-01 NOTE — ED Notes (Signed)
Per EMS, pt from [facility], was sent here to the ED d/t elevated INR.  Pt is alert and oriented per norm.

## 2014-03-01 NOTE — ED Notes (Signed)
Brief changed on patient,  She has broken down skin on bilateral buttocks and beginning of early breakdown

## 2014-03-01 NOTE — ED Provider Notes (Signed)
CSN: [id number]     Arrival date & time 03/01/14  0132 History   First MD Initiated Contact with Patient 03/01/14 [phone]     Chief Complaint  Patient presents with  . Abnormal Lab     (Consider location/radiation/quality/duration/timing/severity/associated sxs/prior Treatment) HPI Comments: Level V caveat apply secondary to change in mental status.  78 year old female with a history of COPD, esophageal reflux, NSC lung CA s/p XRT in 2011, and 90 pack-year smoking history presents to the ED from [facility] for elevated INR. Patient on chronic coumadin. She is alert but does not appear to be oriented to place or time. She denies chest pain, shortness of breath, abdominal pain, and back pain. No family at bedside.  The history is provided by the patient. No language interpreter was used.    Past Medical History  Diagnosis Date  . Unspecified sinusitis (chronic)   . Obstructive sleep apnea   . COPD (chronic obstructive pulmonary disease)     on home O2, chronic steroid use  . Asthma   . Lung nodule   . SVT (supraventricular tachycardia)   . Atrial flutter     typical appearing  . Hypothyroidism   . GERD (gastroesophageal reflux disease)   . Anxiety   . Depression   . History of radiation therapy 11/08/2009-11/18/2009    left upper lung  . Allergy   . Cancer     lung ca s/p XRT 2011  . Lung cancer     non-small cell ca let upper lung stage IA   Past Surgical History  Procedure Laterality Date  . Hysterectomy      abdominal  . Tonsillectomy    . Cystectomy      removal from scalp  . Bladder repair      bladder tack  . Appendectomy    . Lung biopsy      Needle bx lung nodule - atypical cells- complicated by PTX/ chest tube  . Abdominal hysterectomy    . Svt ablation  9//26/13    AVNRT and CTI ablation by Dr Rayann Heman   Family History  Problem Relation Age of Onset  . Heart disease Mother 43   History  Substance Use Topics  . Smoking status: Former  Smoker -- 2.00 packs/day for 40 years    Types: Cigarettes    Quit date: 09/18/1986  . Smokeless tobacco: Never Used  . Alcohol Use: No   OB History   Grav Para Term Preterm Abortions TAB SAB Ect Mult Living                  Review of Systems  Unable to perform ROS: Mental status change  Respiratory: Negative for shortness of breath.   Cardiovascular: Negative for chest pain.  Gastrointestinal: Negative for abdominal pain.  Musculoskeletal: Negative for back pain.     Allergies  Clarithromycin  Home Medications   Prior to Admission medications   Medication Sig Start Date End Date Taking? Authorizing Provider  acetaminophen (TYLENOL) 325 MG tablet Take 2 tablets (650 mg total) by mouth every 6 (six) hours as needed for mild pain. 02/09/14  Yes Wenda Low, MD  albuterol (PROVENTIL HFA;VENTOLIN HFA) 108 (90 BASE) MCG/ACT inhaler Inhale 2 puffs into the lungs every 6 (six) hours as needed. For shortness of breath   Yes Historical Provider, MD  ALPRAZolam (XANAX) 0.25 MG tablet Take 1 tablet (0.25 mg total) by mouth every 8 (eight) hours as needed for anxiety. 02/10/14  Yes  Wenda Low, MD  antiseptic oral rinse (BIOTENE) LIQD 15 mLs by Mouth Rinse route 2 (two) times daily. 02/09/14  Yes Wenda Low, MD  buPROPion [facility] SR) 150 MG 12 hr tablet Take 150 mg by mouth 2 (two) times daily.    Yes Historical Provider, MD  diltiazem (CARDIZEM CD) 240 MG 24 hr capsule take 1 capsule by mouth once daily 07/17/13  Yes Jettie Booze, MD  EPINEPHrine (EPIPEN) 0.3 mg/0.3 mL DEVI Inject 0.3 mg into the muscle as needed. For allergic reactions   Yes Historical Provider, MD  flecainide (TAMBOCOR) 50 MG tablet take 1 tablet by mouth every 12 hours   Yes Jettie Booze, MD  Fluticasone-Salmeterol (ADVAIR) 250-50 MCG/DOSE AEPB Inhale 1 puff into the lungs every 12 (twelve) hours. 07/18/13  Yes Deneise Lever, MD  guaiFENesin (MUCINEX) 600 MG 12 hr tablet Take 600 mg by mouth every  12 (twelve) hours as needed. For congestion   Yes Historical Provider, MD  ipratropium-albuterol (DUONEB) 0.5-2.5 (3) MG/3ML SOLN Take 3 mLs by nebulization 4 (four) times daily. For shortness of breath And Q4hrs prn 02/10/14  Yes Wenda Low, MD  levofloxacin (LEVAQUIN) 500 MG tablet Take 500 mg by mouth daily. 02/25/14 03/03/14 Yes Historical Provider, MD  levothyroxine (SYNTHROID, LEVOTHROID) 75 MCG tablet Take 75 mcg by mouth daily.   Yes Historical Provider, MD  montelukast (SINGULAIR) 10 MG tablet Take 1 tablet (10 mg total) by mouth at bedtime. 10/14/13  Yes Deneise Lever, MD  morphine (MS CONTIN) 15 MG 12 hr tablet Take 1 tablet (15 mg total) by mouth every 12 (twelve) hours. 02/10/14  Yes Wenda Low, MD  nystatin (MYCOSTATIN) 100000 UNIT/ML suspension Take 5 mLs by mouth 3 (three) times daily as needed (pain).   Yes Historical Provider, MD  omalizumab Arvid Right) 150 MG injection Inject 225 mg into the skin every 14 (fourteen) days. Friday   Yes Historical Provider, MD  oxyCODONE-acetaminophen (PERCOCET/ROXICET) 5-325 MG per tablet Take 1-2 tablets by mouth every 6 (six) hours as needed for severe pain (pain). 02/10/14  Yes Wenda Low, MD  predniSONE (DELTASONE) 10 MG tablet Take 10-50 mg by mouth as directed. Taper dose as follows: 50 mg daily  X 3 days, 40 mg daily x 3 days, 30 mg daily x 3 days, 20 mg daily x 3 days, then return to normal dose of 10 mg daily. (Patient is on day 2 of 40 mg dose)   Yes Historical Provider, MD  risedronate (ACTONEL) 35 MG tablet Take 35 mg by mouth every 7 (seven) days. with water on empty stomach, nothing by mouth or lie down for next 30 minutes; takes on Saturdays   Yes Historical Provider, MD  theophylline (THEODUR) 300 MG 12 hr tablet Take 0.5 tablets (150 mg total) by mouth 2 (two) times daily. 10/14/13  Yes Deneise Lever, MD  tiotropium (SPIRIVA) 18 MCG inhalation capsule Place 18 mcg into inhaler and inhale daily.   Yes Historical Provider, MD   traMADol (ULTRAM) 50 MG tablet Take 1 tablet (50 mg total) by mouth 4 (four) times daily as needed (pain). 02/10/14  Yes Wenda Low, MD  venlafaxine (EFFEXOR) 37.5 MG tablet Take 37.5 mg by mouth daily.    Yes Historical Provider, MD  zolpidem (AMBIEN) 5 MG tablet Take 2.5 mg by mouth at bedtime as needed. For sleep   Yes Historical Provider, MD  NON FORMULARY CPAP     Historical Provider, MD  NON FORMULARY Oxygen 2-4 LPM  Historical Provider, MD  warfarin (COUMADIN) 2 MG tablet Take 2 mg by mouth every evening.    Historical Provider, MD   BP 150/60  Pulse 99  Temp(Src) 99.2 F (37.3 C) (Oral)  Resp 22  Ht 5\' 5"  (1.651 m)  Wt 129 lb 1.6 oz (58.559 kg)  BMI 21.48 kg/m2  SpO2 94%  Physical Exam  Nursing note and vitals reviewed. Constitutional: She appears well-developed. No distress.  Nontoxic/nonseptic appearing  HENT:  Head: Normocephalic and atraumatic.  Oropharynx clear and patient tolerating screech is without difficulty. Patient noted to have small amount of blood covering her oral mucosa and lips; no active source identified. Mucous membranes dry.  Eyes: Conjunctivae and EOM are normal. Pupils are equal, round, and reactive to light. No scleral icterus.  Neck: Normal range of motion.  Cardiovascular: Regular rhythm and normal heart sounds.  Tachycardia present.   Pulmonary/Chest: Effort normal. No stridor. Not tachypneic. No respiratory distress. She has no wheezes. She has no rhonchi.  Chest expansion symmetric  Abdominal: Soft. She exhibits no distension. There is no tenderness. There is no rebound and no guarding.  Abdomen soft without obvious signs of tenderness. No masses.  Genitourinary: Rectum normal. Rectal exam shows no fissure, no tenderness and anal tone normal.  Brown hard stool in rectal vault. No gross blood or melena. Normal rectal tone.  Neurological: She is alert. No cranial nerve deficit. She exhibits normal muscle tone.  Patient is alert. GCS 15.  Speech is goal oriented, though patient is confused; she is not alert to place or time. Neurologic exam nonfocal. She moves her extremities without ataxia.  Skin: Skin is warm and dry. No rash noted. She is not diaphoretic. No erythema. There is pallor.  Patient mildly pale appearing    ED Course  Procedures (including critical care time) Labs Review Labs Reviewed  CBC WITH DIFFERENTIAL - Abnormal; Notable for the following:    WBC 20.6 (*)    RBC 1.68 (*)    Hemoglobin 5.1 (*)    HCT 15.7 (*)    RDW 16.5 (*)    Platelets 572 (*)    Neutrophils Relative % 80 (*)    Lymphocytes Relative 11 (*)    Monocytes Relative 2 (*)    Neutro Abs 17.9 (*)    All other components within normal limits  COMPREHENSIVE METABOLIC PANEL - Abnormal; Notable for the following:    CO2 35 (*)    Glucose, Bld 124 (*)    BUN 39 (*)    Albumin 2.6 (*)    AST 61 (*)    ALT 42 (*)    Alkaline Phosphatase 298 (*)    GFR calc non Af Amer 68 (*)    GFR calc Af Amer 79 (*)    All other components within normal limits  APTT - Abnormal; Notable for the following:    aPTT 64 (*)    All other components within normal limits  PROTIME-INR - Abnormal; Notable for the following:    Prothrombin Time 58.4 (*)    INR 7.16 (*)    All other components within normal limits  CBC - Abnormal; Notable for the following:    WBC 12.1 (*)    RBC 3.13 (*)    Hemoglobin 9.2 (*)    HCT 29.4 (*)    RDW 16.6 (*)    All other components within normal limits  CULTURE, EXPECTORATED SPUTUM-ASSESSMENT  GRAM STAIN  TROPONIN I  OCCULT BLOOD X 1 CARD  TO LAB, STOOL  LEGIONELLA ANTIGEN, URINE  STREP PNEUMONIAE URINARY ANTIGEN  PROTIME-INR  POC OCCULT BLOOD, ED  I-STAT CG4 LACTIC ACID, ED  TYPE AND SCREEN  ABO/RH   Imaging Review Dg Chest 2 View  03/01/2014   CLINICAL DATA:  Shortness of breath and cough.  History of COPD.  EXAM: CHEST  2 VIEW  COMPARISON:  Prior radiograph from 02/20/2014  FINDINGS: The cardiac and  mediastinal silhouettes are stable in size and contour, and remain within normal limits.  Changes compatible with COPD again noted. Patchy infiltrate seen within the right upper lobe, suspicious for possible pneumonia.  No acute osseous abnormality identified.  IMPRESSION: 1. Patchy infiltrate within the right upper lobe, suspicious for possible pneumonia. 2. Emphysema.   Electronically Signed   By: Jeannine Boga M.D.   On: 03/01/2014 05:21     EKG Interpretation None      MDM   Final diagnoses:  HCAP (healthcare-associated pneumonia)  Elevated INR  Altered mental status    Patient presents to ED for abnormal lab value; INR elevated at [facility] patient [facility]. Patient altered on arrival talking about how her husband is playing a trick on her. Initial CBC showed Hgb of 5.1, however lab recommended redraw. CBC redrawn and Hgb found to be 9.2. This is slightly lower than patient's baseline of 10 to 11. Hemoccult negative. INR elevated to 7.16.  CXR today shows findings c/w RUL pneumonia. This is likely the cause of her leukocytosis today. No fever. VSS. No hypoxia. Given recent admission will admit for tx of HCAP and monitoring until INR normalizes. Dr. Roel Cluck to admit. Antibiotics initiated.   Filed Vitals:   03/01/14 0430 03/01/14 0605 03/01/14 0635 03/01/14 0701  BP: 137/55   150/60  Pulse:    99  Temp:    99.2 F (37.3 C)  TempSrc:    Oral  Resp:    22  Height:  5\' 5"  (1.651 m)  5\' 5"  (1.651 m)  Weight:  141 lb (63.957 kg)  129 lb 1.6 oz (58.559 kg)  SpO2:   95% 94%     Antonietta Breach, PA-C 03/01/14 [phone]

## 2014-03-01 NOTE — ED Notes (Signed)
Report given to [place] RN  ,  Pt is DNR,  Abnormal labs discussed and medication that needs to be given upon arrival to floor.

## 2014-03-01 NOTE — H&P (Signed)
PCP: Wenda Low, MD    Chief Complaint:  Elevated INR  HPI: [name] is a 78 y.o. female   has a past medical history of Unspecified sinusitis (chronic); Obstructive sleep apnea; COPD (chronic obstructive pulmonary disease); Asthma; Lung nodule; SVT (supraventricular tachycardia); Atrial flutter; Hypothyroidism; GERD (gastroesophageal reflux disease); Anxiety; Depression; History of radiation therapy (11/08/2009-11/18/2009); Allergy; Cancer; and Lung cancer.   Presented with  pateitn was brought from [facility] home due to elevated INR. She was on getting rehab after a fall resulting in back and pelvis fracture. Patient is on coumadin for A.flutter. Family denies any fever. She has some chronic cough but not worse han usual. Family states she has been confused for the past 2 weeks and they feel its due to pain medicines. She was recently started on Solu-Medrol followed by prednisone taper Rocephin and Levaquin likely to treat COPD exacerbation. Upon arrival to the ER patient had lab drawn that showed hemoglobin of 5.1. Patient was starting to be transfused repeat labs were ordered and showed hemoglobin of 9.2 at that time transfusion was discontinued.  Hospitalist was called for admission for elevated INR and HCAP  Review of Systems:    Pertinent positives include: Confusion dry mouth and lips  Constitutional:  No weight loss, night sweats, Fevers, chills, fatigue, weight loss  HEENT:  No headaches, Difficulty swallowing,Tooth/dental problems,Sore throat,  No sneezing, itching, ear ache, nasal congestion, post nasal drip,  Cardio-vascular:  No chest pain, Orthopnea, PND, anasarca, dizziness, palpitations.no Bilateral lower extremity swelling  GI:  No heartburn, indigestion, abdominal pain, nausea, vomiting, diarrhea, change in bowel habits, loss of appetite, melena, blood in stool, hematemesis Resp:  no shortness of breath at rest. No dyspnea on exertion, No excess mucus, no  productive cough, No non-productive cough, No coughing up of blood.No change in color of mucus.No wheezing. Skin:  no rash or lesions. No jaundice GU:  no dysuria, change in color of urine, no urgency or frequency. No straining to urinate.  No flank pain.  Musculoskeletal:  No joint pain or no joint swelling. No decreased range of motion. No back pain.  Psych:  No change in mood or affect. No depression or anxiety. No memory loss.  Neuro: no localizing neurological complaints, no tingling, no weakness, no double vision, no gait abnormality, no slurred speech, no confusion  Otherwise ROS are negative except for above, 10 systems were reviewed  Past Medical History: Past Medical History  Diagnosis Date  . Unspecified sinusitis (chronic)   . Obstructive sleep apnea   . COPD (chronic obstructive pulmonary disease)     on home O2, chronic steroid use  . Asthma   . Lung nodule   . SVT (supraventricular tachycardia)   . Atrial flutter     typical appearing  . Hypothyroidism   . GERD (gastroesophageal reflux disease)   . Anxiety   . Depression   . History of radiation therapy 11/08/2009-11/18/2009    left upper lung  . Allergy   . Cancer     lung ca s/p XRT 2011  . Lung cancer     non-small cell ca let upper lung stage IA   Past Surgical History  Procedure Laterality Date  . Hysterectomy      abdominal  . Tonsillectomy    . Cystectomy      removal from scalp  . Bladder repair      bladder tack  . Appendectomy    . Lung biopsy  Needle bx lung nodule - atypical cells- complicated by PTX/ chest tube  . Abdominal hysterectomy    . Svt ablation  9//26/13    AVNRT and CTI ablation by Dr Rayann Heman     Medications: Prior to Admission medications   Medication Sig Start Date End Date Taking? Authorizing Provider  acetaminophen (TYLENOL) 325 MG tablet Take 2 tablets (650 mg total) by mouth every 6 (six) hours as needed for mild pain. 02/09/14  Yes Wenda Low, MD  albuterol  (PROVENTIL HFA;VENTOLIN HFA) 108 (90 BASE) MCG/ACT inhaler Inhale 2 puffs into the lungs every 6 (six) hours as needed. For shortness of breath   Yes Historical Provider, MD  ALPRAZolam (XANAX) 0.25 MG tablet Take 1 tablet (0.25 mg total) by mouth every 8 (eight) hours as needed for anxiety. 02/10/14  Yes Wenda Low, MD  antiseptic oral rinse (BIOTENE) LIQD 15 mLs by Mouth Rinse route 2 (two) times daily. 02/09/14  Yes Wenda Low, MD  buPROPion [facility] SR) 150 MG 12 hr tablet Take 150 mg by mouth 2 (two) times daily.    Yes Historical Provider, MD  diltiazem (CARDIZEM CD) 240 MG 24 hr capsule take 1 capsule by mouth once daily 07/17/13  Yes Jettie Booze, MD  EPINEPHrine (EPIPEN) 0.3 mg/0.3 mL DEVI Inject 0.3 mg into the muscle as needed. For allergic reactions   Yes Historical Provider, MD  flecainide (TAMBOCOR) 50 MG tablet take 1 tablet by mouth every 12 hours   Yes Jettie Booze, MD  Fluticasone-Salmeterol (ADVAIR) 250-50 MCG/DOSE AEPB Inhale 1 puff into the lungs every 12 (twelve) hours. 07/18/13  Yes Deneise Lever, MD  guaiFENesin (MUCINEX) 600 MG 12 hr tablet Take 600 mg by mouth every 12 (twelve) hours as needed. For congestion   Yes Historical Provider, MD  ipratropium-albuterol (DUONEB) 0.5-2.5 (3) MG/3ML SOLN Take 3 mLs by nebulization 4 (four) times daily. For shortness of breath And Q4hrs prn 02/10/14  Yes Wenda Low, MD  levofloxacin (LEVAQUIN) 500 MG tablet Take 500 mg by mouth daily. 02/25/14 03/03/14 Yes Historical Provider, MD  levothyroxine (SYNTHROID, LEVOTHROID) 75 MCG tablet Take 75 mcg by mouth daily.   Yes Historical Provider, MD  montelukast (SINGULAIR) 10 MG tablet Take 1 tablet (10 mg total) by mouth at bedtime. 10/14/13  Yes Deneise Lever, MD  morphine (MS CONTIN) 15 MG 12 hr tablet Take 1 tablet (15 mg total) by mouth every 12 (twelve) hours. 02/10/14  Yes Wenda Low, MD  nystatin (MYCOSTATIN) 100000 UNIT/ML suspension Take 5 mLs by mouth 3 (three)  times daily as needed (pain).   Yes Historical Provider, MD  omalizumab Arvid Right) 150 MG injection Inject 225 mg into the skin every 14 (fourteen) days. Friday   Yes Historical Provider, MD  oxyCODONE-acetaminophen (PERCOCET/ROXICET) 5-325 MG per tablet Take 1-2 tablets by mouth every 6 (six) hours as needed for severe pain (pain). 02/10/14  Yes Wenda Low, MD  predniSONE (DELTASONE) 10 MG tablet Take 10-50 mg by mouth as directed. Taper dose as follows: 50 mg daily  X 3 days, 40 mg daily x 3 days, 30 mg daily x 3 days, 20 mg daily x 3 days, then return to normal dose of 10 mg daily. (Patient is on day 2 of 40 mg dose)   Yes Historical Provider, MD  risedronate (ACTONEL) 35 MG tablet Take 35 mg by mouth every 7 (seven) days. with water on empty stomach, nothing by mouth or lie down for next 30 minutes; takes on Saturdays  Yes Historical Provider, MD  theophylline (THEODUR) 300 MG 12 hr tablet Take 0.5 tablets (150 mg total) by mouth 2 (two) times daily. 10/14/13  Yes Deneise Lever, MD  tiotropium (SPIRIVA) 18 MCG inhalation capsule Place 18 mcg into inhaler and inhale daily.   Yes Historical Provider, MD  traMADol (ULTRAM) 50 MG tablet Take 1 tablet (50 mg total) by mouth 4 (four) times daily as needed (pain). 02/10/14  Yes Wenda Low, MD  venlafaxine (EFFEXOR) 37.5 MG tablet Take 37.5 mg by mouth daily.    Yes Historical Provider, MD  zolpidem (AMBIEN) 5 MG tablet Take 2.5 mg by mouth at bedtime as needed. For sleep   Yes Historical Provider, MD  NON FORMULARY CPAP     Historical Provider, MD  NON FORMULARY Oxygen 2-4 LPM    Historical Provider, MD  warfarin (COUMADIN) 2 MG tablet Take 2 mg by mouth every evening.    Historical Provider, MD    Allergies:   Allergies  Allergen Reactions  . Clarithromycin     REACTION: nausea    Social History:  Ambulatory   bed bound From [facility]   reports that she quit smoking about 27 years ago. Her smoking use included Cigarettes.  She has a 80 pack-year smoking history. She has never used smokeless tobacco. She reports that she does not drink alcohol or use illicit drugs.    Family History: family history includes Heart disease (age of onset: 62) in her mother.    Physical Exam: Patient Vitals for the past 24 hrs:  BP Temp Temp src Pulse Resp SpO2  03/01/14 0430 137/55 mmHg - - - - -  03/01/14 0400 137/51 mmHg - - - 22 -  03/01/14 0330 136/60 mmHg - - 102 - 99 %  03/01/14 0224 131/59 mmHg - - 104 25 100 %  03/01/14 0200 138/52 mmHg - - 105 19 93 %  03/01/14 0145 142/52 mmHg 98.4 F (36.9 C) Oral 104 26 99 %    1. General:  in No Acute distress 2. Psychological: Alert but not  Oriented 3. Head/ENT:  Dry Mucous Membranes                          Head Non traumatic, neck supple                           Poor Dentition 4. SKIN:decreased Skin turgor,  Skin clean Dry and intact no rash lips appear to be chapped with bloody discharge patient continuously picking at the lips traumatizing them 5. Heart: Regular rate and rhythm no Murmur, Rub or gallop 6. Lungs: Some wheezes occasional crackles   7. Abdomen: Soft, non-tender, Non distended 8. Lower extremities: no clubbing, cyanosis, or edema 9. Neurologically Grossly intact, moving all 4 extremities equally 10. MSK: Normal range of motion  body mass index is unknown because there is no weight on file.   Labs on Admission:   Recent Labs  03/01/14 0158  NA 146  K 4.5  CL 100  CO2 35*  GLUCOSE 124*  BUN 39*  CREATININE 0.82  CALCIUM 9.9    Recent Labs  03/01/14 0158  AST 61*  ALT 42*  ALKPHOS 298*  BILITOT 0.5  PROT 6.1  ALBUMIN 2.6*   No results found for this basename: LIPASE, AMYLASE,  in the last 72 hours  Recent Labs  03/01/14 0158 03/01/14 0418  WBC 20.6* 12.1*  NEUTROABS PENDING  --   HGB 5.1* 9.2*  HCT 15.7* 29.4*  MCV 93.5 93.9  PLT 572* 278    Recent Labs  03/01/14 0418  TROPONINI <0.30   No results found for this  basename: TSH, T4TOTAL, FREET3, T3FREE, THYROIDAB,  in the last 72 hours No results found for this basename: VITAMINB12, FOLATE, FERRITIN, TIBC, IRON, RETICCTPCT,  in the last 72 hours No results found for this basename: HGBA1C    The CrCl is unknown because both a height and weight (above a minimum accepted value) are required for this calculation. ABG    Component Value Date/Time   PHART 7.292* 02/02/2014 0546   HCO3 27.0* 02/02/2014 0546   TCO2 29 02/02/2014 0546   ACIDBASEDEF 2.0 02/02/2014 0401   O2SAT 96.0 02/02/2014 0546     Lab Results  Component Value Date   DDIMER  Value: 0.33        AT THE INHOUSE ESTABLISHED CUTOFF VALUE OF 0.48 ug/mL FEU, THIS ASSAY HAS BEEN DOCUMENTED IN THE LITERATURE TO HAVE A SENSITIVITY AND NEGATIVE PREDICTIVE VALUE OF AT LEAST 98 TO 99%.  THE TEST RESULT SHOULD BE CORRELATED WITH AN ASSESSMENT OF THE CLINICAL PROBABILITY OF DVT / VTE. 10/12/2010    BNP (last 3 results) No results found for this basename: PROBNP,  in the last 8760 hours  There were no vitals filed for this visit.   Cultures:    Component Value Date/Time   SDES BLOOD RIGHT ANTECUBITAL 02/02/2014 0422   SPECREQUEST BOTTLES DRAWN AEROBIC ONLY 5CC 02/02/2014 0422   CULT  Value: NO GROWTH 5 DAYS Performed at [facility] 02/02/2014 0422   REPTSTATUS 02/08/2014 FINAL 02/02/2014 0422     Radiological Exams on Admission: Dg Chest 2 View  03/01/2014   CLINICAL DATA:  Shortness of breath and cough.  History of COPD.  EXAM: CHEST  2 VIEW  COMPARISON:  Prior radiograph from 02/20/2014  FINDINGS: The cardiac and mediastinal silhouettes are stable in size and contour, and remain within normal limits.  Changes compatible with COPD again noted. Patchy infiltrate seen within the right upper lobe, suspicious for possible pneumonia.  No acute osseous abnormality identified.  IMPRESSION: 1. Patchy infiltrate within the right upper lobe, suspicious for possible pneumonia. 2. Emphysema.    Electronically Signed   By: Jeannine Boga M.D.   On: 03/01/2014 05:21    Chart has been reviewed  Assessment/Plan  78 year old female with history of atrial flutter/atrial fibrillation on Coumadin currently supratherapeutic, COPD with recent COPD exacerbation presents with an elevated INR bloody discharge around the lips and evidence of pneumonia on chest x-ray  Present on Admission:  . HCAP (healthcare-associated pneumonia) - will cover for broad-spectrum antibiotics, admit to telemetry obtain sputum cultures  . Atrial flutter - monitor in telemetry hold Coumadin due to an elevated INR  . COPD with emphysema - continue prednisone and nebulizer treatments  . OBSTRUCTIVE SLEEP APNEA - ordered CPAP for nighttime  . Elevated INR VITAMIN K 2.5 mg by mouth once and I will continue to follow INR avoid overtreatment currently there is no active bleeding patient is Hemoccult negative. Superficial mucous irritation I will it was noted     Prophylaxis: Supratherapeutic on Coumadin, Protonix  CODE STATUS:    DNR/DNI  Other plan as per orders.  I have spent a total of 55 min on this admission  Aurelio Mccamy 03/01/2014, 5:35 AM  [facility]  Pager [phone]   If 7AM-7PM, please  contact the day team taking care of the patient  [email or web address]  Password [email or web address]

## 2014-03-01 NOTE — ED Notes (Signed)
Type and screen has been drawn and sent by [facility] EMT

## 2014-03-01 NOTE — ED Notes (Signed)
Per facility's staff, pt blood was drawn earlier yesterday, tonight INR resulted is elevated.  Pt is alert and oriented per norm.  Denies any pain at this time

## 2014-03-01 NOTE — Progress Notes (Signed)
ANTIBIOTIC CONSULT NOTE - INITIAL  Pharmacy Consult for Vancomycin and Cefepime Indication: HCAP  Allergies  Allergen Reactions  . Clarithromycin     REACTION: nausea    Patient Measurements: Height: 5\' 5"  (165.1 cm) Weight: 141 lb (63.957 kg) IBW/kg (Calculated) : 57   Vital Signs: Temp: 98.4 F (36.9 C) (06/14 0145) Temp src: Oral (06/14 0145) BP: 137/55 mmHg (06/14 0430) Pulse Rate: 102 (06/14 0330) Intake/Output from previous day:   Intake/Output from this shift:    Labs:  Recent Labs  03/01/14 0158 03/01/14 0418  WBC 20.6* 12.1*  HGB 5.1* 9.2*  PLT 572* 278  CREATININE 0.82  --    Estimated Creatinine Clearance: 53.3 ml/min (by C-G formula based on Cr of 0.82). No results found for this basename: VANCOTROUGH, Corlis Leak, VANCORANDOM, GENTTROUGH, GENTPEAK, GENTRANDOM, TOBRATROUGH, TOBRAPEAK, TOBRARND, AMIKACINPEAK, AMIKACINTROU, AMIKACIN,  in the last 72 hours   Microbiology: Recent Results (from the past 720 hour(s))  CULTURE, BLOOD (ROUTINE X 2)     Status: None   Collection Time    02/02/14  3:59 AM      Result Value Ref Range Status   Specimen Description BLOOD RIGHT FOREARM   Final   Special Requests BOTTLES DRAWN AEROBIC AND ANAEROBIC 5CC EA   Final   Culture  Setup Time     Final   Value: 02/02/2014 08:56     Performed at [facility]   Culture     Final   Value: NO GROWTH 5 DAYS     Performed at [facility]   Report Status 02/08/2014 FINAL   Final  CULTURE, BLOOD (ROUTINE X 2)     Status: None   Collection Time    02/02/14  4:22 AM      Result Value Ref Range Status   Specimen Description BLOOD RIGHT ANTECUBITAL   Final   Special Requests BOTTLES DRAWN AEROBIC ONLY 5CC   Final   Culture  Setup Time     Final   Value: 02/02/2014 08:56     Performed at [facility]   Culture     Final   Value: NO GROWTH 5 DAYS     Performed at [facility]   Report Status 02/08/2014 FINAL   Final  MRSA PCR SCREENING      Status: None   Collection Time    02/02/14  6:27 AM      Result Value Ref Range Status   MRSA by PCR NEGATIVE  NEGATIVE Final   Comment:            The GeneXpert MRSA Assay (FDA     approved for NASAL specimens     only), is one component of a     comprehensive MRSA colonization     surveillance program. It is not     intended to diagnose MRSA     infection nor to guide or     monitor treatment for     MRSA infections.    Medical History: Past Medical History  Diagnosis Date  . Unspecified sinusitis (chronic)   . Obstructive sleep apnea   . COPD (chronic obstructive pulmonary disease)     on home O2, chronic steroid use  . Asthma   . Lung nodule   . SVT (supraventricular tachycardia)   . Atrial flutter     typical appearing  . Hypothyroidism   . GERD (gastroesophageal reflux disease)   . Anxiety   . Depression   .  History of radiation therapy 11/08/2009-11/18/2009    left upper lung  . Allergy   . Cancer     lung ca s/p XRT 2011  . Lung cancer     non-small cell ca let upper lung stage IA    Medications:  Scheduled:   Infusions:  . ceFEPime (MAXIPIME) IV    . ceFEPime (MAXIPIME) IV    . vancomycin     Assessment: 78 yo with hx unspecified sinusitiis, Obstructive sleep apnea, COPD, asthma, lung nodule, SVT, A-fib (on chronic warfarin), hypothyroidism, GERD, anxiety, depression, Lung Ca with radiation therapy.  Pt was sent from NH with elevated INR.  Cefepime and Vancomycin per Rx for HCAP.    Goal of Therapy:  Vancomycin trough level 15-20 mcg/ml  Plan:   Cefepime 1Gm IV q8h  Vancomycin 500mg  IV q12h  F/u SCr/cultures/levels as needed  Dorrene German 03/01/2014,6:09 AM

## 2014-03-01 NOTE — ED Notes (Signed)
Admitting MD at bedside.

## 2014-03-01 NOTE — Progress Notes (Signed)
Subjective: Patient pleasant, no apparent distress. Admission H&P reviewed, patient sent to the emergency room for elevated INR, apparently she was on treatment for COPD exacerbation. X-ray in the ED suspicious for pneumonia therefore patient was admitted. Notes suggest vitamin K was given. There is no obvious bleeding., Current INR 7.  Objective: Vital signs in last 24 hours: Temp:  [98.4 F (36.9 C)-99.2 F (37.3 C)] 99.2 F (37.3 C) (06/14 0701) Pulse Rate:  [99-105] 99 (06/14 0701) Resp:  [19-26] 22 (06/14 0701) BP: (131-150)/(51-60) 150/60 mmHg (06/14 0701) SpO2:  [90 %-100 %] 92 % (06/14 0924) Weight:  [58.559 kg (129 lb 1.6 oz)-63.957 kg (141 lb)] 58.559 kg (129 lb 1.6 oz) (06/14 0701) Weight change:     Intake/Output from previous day:   Intake/Output this shift:    General appearance: Patient pleasant, no apparent distress, she is slightly confused Resp: Bilateral expiratory wheeze Cardio: regular rate and rhythm Extremities: extremities normal, atraumatic, no cyanosis or edema  Lab Results:  Results for orders placed during the hospital encounter of 03/01/14 (from the past 24 hour(s))  CBC WITH DIFFERENTIAL     Status: Abnormal   Collection Time    03/01/14  1:58 AM      Result Value Ref Range   WBC 20.6 (*) 4.0 - 10.5 K/uL   RBC 1.68 (*) 3.87 - 5.11 MIL/uL   Hemoglobin 5.1 (*) 12.0 - 15.0 g/dL   HCT 15.7 (*) 36.0 - 46.0 %   MCV 93.5  78.0 - 100.0 fL   MCH 30.4  26.0 - 34.0 pg   MCHC 32.5  30.0 - 36.0 g/dL   RDW 16.5 (*) 11.5 - 15.5 %   Platelets 572 (*) 150 - 400 K/uL   Neutrophils Relative % 80 (*) 43 - 77 %   Lymphocytes Relative 11 (*) 12 - 46 %   Monocytes Relative 2 (*) 3 - 12 %   Eosinophils Relative 0  0 - 5 %   Basophils Relative 0  0 - 1 %   Band Neutrophils 0  0 - 10 %   Metamyelocytes Relative 4     Myelocytes 3     Promyelocytes Absolute 0     Blasts 0     nRBC 0  0 /100 WBC   Neutro Abs 17.9 (*) 1.7 - 7.7 K/uL   Lymphs Abs 2.3  0.7 - 4.0  K/uL   Monocytes Absolute 0.4  0.1 - 1.0 K/uL   Eosinophils Absolute 0.0  0.0 - 0.7 K/uL   Basophils Absolute 0.0  0.0 - 0.1 K/uL   RBC Morphology TEARDROP CELLS     WBC Morphology MILD LEFT SHIFT (1-5% METAS, OCC MYELO, OCC BANDS)    COMPREHENSIVE METABOLIC PANEL     Status: Abnormal   Collection Time    03/01/14  1:58 AM      Result Value Ref Range   Sodium 146  137 - 147 mEq/L   Potassium 4.5  3.7 - 5.3 mEq/L   Chloride 100  96 - 112 mEq/L   CO2 35 (*) 19 - 32 mEq/L   Glucose, Bld 124 (*) 70 - 99 mg/dL   BUN 39 (*) 6 - 23 mg/dL   Creatinine, Ser 0.82  0.50 - 1.10 mg/dL   Calcium 9.9  8.4 - 10.5 mg/dL   Total Protein 6.1  6.0 - 8.3 g/dL   Albumin 2.6 (*) 3.5 - 5.2 g/dL   AST 61 (*) 0 - 37 U/L  ALT 42 (*) 0 - 35 U/L   Alkaline Phosphatase 298 (*) 39 - 117 U/L   Total Bilirubin 0.5  0.3 - 1.2 mg/dL   GFR calc non Af Amer 68 (*) >90 mL/min   GFR calc Af Amer 79 (*) >90 mL/min  APTT     Status: Abnormal   Collection Time    03/01/14  1:58 AM      Result Value Ref Range   aPTT 64 (*) 24 - 37 seconds  PROTIME-INR     Status: Abnormal   Collection Time    03/01/14  1:58 AM      Result Value Ref Range   Prothrombin Time 58.4 (*) 11.6 - 15.2 seconds   INR 7.16 (*) 0.00 - 1.49  POC OCCULT BLOOD, ED     Status: None   Collection Time    03/01/14  3:25 AM      Result Value Ref Range   Fecal Occult Bld NEGATIVE  NEGATIVE  ABO/RH     Status: None   Collection Time    03/01/14  4:18 AM      Result Value Ref Range   ABO/RH(D) A POS    TROPONIN I     Status: None   Collection Time    03/01/14  4:18 AM      Result Value Ref Range   Troponin I <0.30  <0.30 ng/mL  CBC     Status: Abnormal   Collection Time    03/01/14  4:18 AM      Result Value Ref Range   WBC 12.1 (*) 4.0 - 10.5 K/uL   RBC 3.13 (*) 3.87 - 5.11 MIL/uL   Hemoglobin 9.2 (*) 12.0 - 15.0 g/dL   HCT 29.4 (*) 36.0 - 46.0 %   MCV 93.9  78.0 - 100.0 fL   MCH 29.4  26.0 - 34.0 pg   MCHC 31.3  30.0 - 36.0 g/dL    RDW 16.6 (*) 11.5 - 15.5 %   Platelets 278  150 - 400 K/uL  TYPE AND SCREEN     Status: None   Collection Time    03/01/14  4:46 AM      Result Value Ref Range   ABO/RH(D) A POS     Antibody Screen NEG     Sample Expiration 03/04/2014    I-STAT CG4 LACTIC ACID, ED     Status: None   Collection Time    03/01/14  4:57 AM      Result Value Ref Range   Lactic Acid, Venous 1.47  0.5 - 2.2 mmol/L      Studies/Results: Dg Chest 2 View  03/01/2014   CLINICAL DATA:  Shortness of breath and cough.  History of COPD.  EXAM: CHEST  2 VIEW  COMPARISON:  Prior radiograph from 02/20/2014  FINDINGS: The cardiac and mediastinal silhouettes are stable in size and contour, and remain within normal limits.  Changes compatible with COPD again noted. Patchy infiltrate seen within the right upper lobe, suspicious for possible pneumonia.  No acute osseous abnormality identified.  IMPRESSION: 1. Patchy infiltrate within the right upper lobe, suspicious for possible pneumonia. 2. Emphysema.   Electronically Signed   By: Jeannine Boga M.D.   On: 03/01/2014 05:21    Medications:  Prior to Admission:  Prescriptions prior to admission  Medication Sig Dispense Refill  . acetaminophen (TYLENOL) 325 MG tablet Take 2 tablets (650 mg total) by mouth every 6 (six) hours as needed for  mild pain.  30 tablet  2  . albuterol (PROVENTIL HFA;VENTOLIN HFA) 108 (90 BASE) MCG/ACT inhaler Inhale 2 puffs into the lungs every 6 (six) hours as needed. For shortness of breath      . ALPRAZolam (XANAX) 0.25 MG tablet Take 1 tablet (0.25 mg total) by mouth every 8 (eight) hours as needed for anxiety.  30 tablet  0  . antiseptic oral rinse (BIOTENE) LIQD 15 mLs by Mouth Rinse route 2 (two) times daily.  120 mL  2  . buPROPion (WELLBUTRIN SR) 150 MG 12 hr tablet Take 150 mg by mouth 2 (two) times daily.       Marland Kitchen diltiazem (CARDIZEM CD) 240 MG 24 hr capsule take 1 capsule by mouth once daily  30 capsule  6  . EPINEPHrine (EPIPEN)  0.3 mg/0.3 mL DEVI Inject 0.3 mg into the muscle as needed. For allergic reactions      . flecainide (TAMBOCOR) 50 MG tablet take 1 tablet by mouth every 12 hours  60 tablet  5  . Fluticasone-Salmeterol (ADVAIR) 250-50 MCG/DOSE AEPB Inhale 1 puff into the lungs every 12 (twelve) hours.  60 each  6  . guaiFENesin (MUCINEX) 600 MG 12 hr tablet Take 600 mg by mouth every 12 (twelve) hours as needed. For congestion      . ipratropium-albuterol (DUONEB) 0.5-2.5 (3) MG/3ML SOLN Take 3 mLs by nebulization 4 (four) times daily. For shortness of breath And Q4hrs prn  360 mL  11  . levofloxacin (LEVAQUIN) 500 MG tablet Take 500 mg by mouth daily.      Marland Kitchen levothyroxine (SYNTHROID, LEVOTHROID) 75 MCG tablet Take 75 mcg by mouth daily.      . montelukast (SINGULAIR) 10 MG tablet Take 1 tablet (10 mg total) by mouth at bedtime.  30 tablet  5  . morphine (MS CONTIN) 15 MG 12 hr tablet Take 1 tablet (15 mg total) by mouth every 12 (twelve) hours.  30 tablet  0  . nystatin (MYCOSTATIN) 100000 UNIT/ML suspension Take 5 mLs by mouth 3 (three) times daily as needed (pain).      Marland Kitchen omalizumab (XOLAIR) 150 MG injection Inject 225 mg into the skin every 14 (fourteen) days. Friday      . oxyCODONE-acetaminophen (PERCOCET/ROXICET) 5-325 MG per tablet Take 1-2 tablets by mouth every 6 (six) hours as needed for severe pain (pain).  60 tablet  0  . predniSONE (DELTASONE) 10 MG tablet Take 10-50 mg by mouth as directed. Taper dose as follows: 50 mg daily  X 3 days, 40 mg daily x 3 days, 30 mg daily x 3 days, 20 mg daily x 3 days, then return to normal dose of 10 mg daily. (Patient is on day 2 of 40 mg dose)      . risedronate (ACTONEL) 35 MG tablet Take 35 mg by mouth every 7 (seven) days. with water on empty stomach, nothing by mouth or lie down for next 30 minutes; takes on Saturdays      . theophylline (THEODUR) 300 MG 12 hr tablet Take 0.5 tablets (150 mg total) by mouth 2 (two) times daily.  30 tablet  5  . tiotropium  (SPIRIVA) 18 MCG inhalation capsule Place 18 mcg into inhaler and inhale daily.      . traMADol (ULTRAM) 50 MG tablet Take 1 tablet (50 mg total) by mouth 4 (four) times daily as needed (pain).  30 tablet  0  . venlafaxine (EFFEXOR) 37.5 MG tablet Take 37.5 mg by  mouth daily.       Marland Kitchen zolpidem (AMBIEN) 5 MG tablet Take 2.5 mg by mouth at bedtime as needed. For sleep      . NON FORMULARY CPAP       . NON FORMULARY Oxygen 2-4 LPM      . warfarin (COUMADIN) 2 MG tablet Take 2 mg by mouth every evening.       Scheduled: . antiseptic oral rinse  15 mL Mouth Rinse BID  . buPROPion  150 mg Oral BID  . ceFEPime (MAXIPIME) IV  1 g Intravenous Q8H  . diltiazem  240 mg Oral Daily  . flecainide  50 mg Oral Q12H  . ipratropium-albuterol  3 mL Nebulization QID  . levothyroxine  75 mcg Oral Daily  . mometasone-formoterol  2 puff Inhalation BID  . montelukast  10 mg Oral QHS  . morphine  15 mg Oral Q12H  . predniSONE  40 mg Oral Q breakfast  . theophylline  150 mg Oral BID  . tiotropium  18 mcg Inhalation Daily  . vancomycin  500 mg Intravenous Q12H  . venlafaxine  37.5 mg Oral Daily   Continuous: . sodium chloride 75 mL/hr at 03/01/14 0701   HCW:[id number], albuterol, ALPRAZolam, guaiFENesin, nystatin, oxyCODONE-acetaminophen, traMADol  Assessment/Plan: Elderly female with  history of COPD, admitted to the hospital, x-ray showing pneumonia. Currently being treated for healthcare associated pneumonia. She still has significant wheezing, continue oxygen, frequent nebulized treatments, steroids. Supratherapeutic INR, check daily INR,  currently no signs of bleeding, current H&H stable. Initially presumed to be low however followup CBC within normal limits. Currently patient hemodynamically stable Leukocytosis multifactorial steroids, infectious process. We will continue followup as clinically indicated  LOS: 0 days   Lexia Vandevender D 03/01/2014, 10:51 AM

## 2014-03-01 NOTE — ED Notes (Signed)
Patient transported to X-ray 

## 2014-03-02 LAB — COMPREHENSIVE METABOLIC PANEL
ALBUMIN: 2.6 g/dL — AB (ref 3.5–5.2)
ALK PHOS: 263 U/L — AB (ref 39–117)
ALT: 36 U/L — AB (ref 0–35)
AST: 37 U/L (ref 0–37)
BUN: 23 mg/dL (ref 6–23)
CO2: 37 mEq/L — ABNORMAL HIGH (ref 19–32)
Calcium: 9.1 mg/dL (ref 8.4–10.5)
Chloride: 101 mEq/L (ref 96–112)
Creatinine, Ser: 0.66 mg/dL (ref 0.50–1.10)
GFR calc Af Amer: >90 mL/min (ref >90)
GFR calc non Af Amer: 84 mL/min — ABNORMAL LOW (ref >90)
Glucose, Bld: 136 mg/dL — ABNORMAL HIGH (ref 70–99)
POTASSIUM: 3.4 meq/L — AB (ref 3.7–5.3)
Sodium: 145 mEq/L (ref 137–147)
Total Bilirubin: 0.6 mg/dL (ref 0.3–1.2)
Total Protein: 5.8 g/dL — ABNORMAL LOW (ref 6.0–8.3)

## 2014-03-02 LAB — CBC
HCT: 32.7 % — ABNORMAL LOW (ref 36.0–46.0)
Hemoglobin: 10.2 g/dL — ABNORMAL LOW (ref 12.0–15.0)
MCH: 29.3 pg (ref 26.0–34.0)
MCHC: 31.2 g/dL (ref 30.0–36.0)
MCV: 94 fL (ref 78.0–100.0)
Platelets: 320 10*3/uL (ref 150–400)
RBC: 3.48 MIL/uL — ABNORMAL LOW (ref 3.87–5.11)
RDW: 16.3 % — AB (ref 11.5–15.5)
WBC: 14.7 10*3/uL — ABNORMAL HIGH (ref 4.0–10.5)

## 2014-03-02 LAB — STREP PNEUMONIAE URINARY ANTIGEN: Strep Pneumo Urinary Antigen: NEGATIVE

## 2014-03-02 LAB — PROTIME-INR
INR: 2.21 — ABNORMAL HIGH (ref 0.00–1.49)
Prothrombin Time: 23.8 seconds — ABNORMAL HIGH (ref 11.6–15.2)

## 2014-03-02 MED ORDER — POTASSIUM CHLORIDE CRYS ER 20 MEQ PO TBCR
20.0000 meq | EXTENDED_RELEASE_TABLET | Freq: Once | ORAL | Status: AC
  Administered 2014-03-02: 20 meq via ORAL
  Filled 2014-03-02: qty 1

## 2014-03-02 MED ORDER — ZOLPIDEM TARTRATE 5 MG PO TABS
5.0000 mg | ORAL_TABLET | Freq: Every evening | ORAL | Status: DC | PRN
  Administered 2014-03-03: 5 mg via ORAL
  Filled 2014-03-02: qty 1

## 2014-03-02 MED ORDER — PREDNISONE 10 MG PO TABS
10.0000 mg | ORAL_TABLET | Freq: Every day | ORAL | Status: DC
  Administered 2014-03-02 – 2014-03-04 (×3): 10 mg via ORAL
  Filled 2014-03-02 (×4): qty 1

## 2014-03-02 MED ORDER — WARFARIN - PHARMACIST DOSING INPATIENT: Freq: Every day | Status: DC

## 2014-03-02 MED ORDER — WARFARIN SODIUM 1 MG PO TABS
1.0000 mg | ORAL_TABLET | Freq: Once | ORAL | Status: AC
  Administered 2014-03-02: 1 mg via ORAL
  Filled 2014-03-02: qty 1

## 2014-03-02 NOTE — Progress Notes (Signed)
CARE MANAGEMENT NOTE 03/02/2014  Patient:  [name],[name]   Account Number:  [id number]  Date Initiated:  03/02/2014  Documentation initiated by:  Olga Coaster  Subjective/Objective Assessment:   ADMITTED WITH WITH ELEVATED INR, COPD     Action/Plan:   PATIENT RESIDES IN SKILLED NURSING FACILITY; [facility] WORKER REFERRAL PLACED   Anticipated DC Date:  03/09/2014   Anticipated DC Plan:  SKILLED NURSING FACILITY  In-house referral  Clinical Social Worker      DC Planning Services  CM consult         Status of service:  In process, will continue to follow Medicare Important Message given?   (If response is "NO", the following Medicare IM given date fields will be blank)  Per UR Regulation:  Reviewed for med. necessity/level of care/duration of stay  Comments:  6/15/2015Mindi Slicker RN,BSN,MHA [phone]

## 2014-03-02 NOTE — Evaluation (Signed)
Physical Therapy Evaluation Patient Details Name: [name] MRN: [id number] DOB: 24-Aug-1936 Today's Date: 03/02/2014   History of Present Illness  [name] is a 78 y.o. female admitted with PNA, atrial flutter. H/o Acute respiratory distress requiring BiPAP.+ pna; Bilateral superior & inferior rami fx  PMHx-extensive history of COPD on home O2/chronic steroids; anxiety;   Clinical Impression  *Pt admitted from SNF with *PNA, atrial flutter, recent pelvic fx*. Pt currently with functional limitations due to the deficits listed below (see PT Problem List).  Pt will benefit from skilled PT to increase their independence and safety with mobility to allow discharge to the venue listed below.   Assisted pt to sit on edge of bed. Pt reported pain with all movement. She's not oriented to place nor to situation. Pt is unable to stand per nursing who recently assisted her to [facility], requiring total assist of 2 people.  Pain is limiting activity tolerance. PT will follow.  **    Follow Up Recommendations SNF;Supervision/Assistance - 24 hour    Equipment Recommendations  None recommended by PT    Recommendations for Other Services       Precautions / Restrictions Precautions Precautions: Fall Precaution Comments: h/o syncope Restrictions Weight Bearing Restrictions: No RLE Weight Bearing: Weight bearing as tolerated LLE Weight Bearing: Weight bearing as tolerated      Mobility  Bed Mobility Overal bed mobility: Needs Assistance Bed Mobility: Rolling;Sidelying to Sit Rolling: Mod assist Sidelying to sit: Total assist       General bed mobility comments: pt rolled right with mod assist to initiate movement, total assist (pt 25%) for sidelying to sit, assist to raise trunk and support BLEs, all movements limited by pain  Transfers                 General transfer comment: Not attempted, nursing stated they'd recently assisted pt to [facility] and that it was a 2 person total  lift, pt unable to stand  Ambulation/Gait                Stairs            Wheelchair Mobility    Modified Rankin (Stroke Patients Only)       Balance Overall balance assessment: Needs assistance Sitting-balance support: Feet supported;Bilateral upper extremity supported Sitting balance-Leahy Scale: Fair Sitting balance - Comments: pt sat on EOB x 4 minutes, initially required min A due to posterior lean, then able to sit without assist                                     Pertinent Vitals/Pain *6/10 pain R pelvis with bed mobility premedicated**    Home Living Family/patient expects to be discharged to:: Skilled nursing facility                      Prior Function Level of Independence: Needs assistance   Gait / Transfers Assistance Needed: pt is not oriented to place/situation, neither she nor husband know if she was walking while at SNF           Hand Dominance        Extremity/Trunk Assessment   Upper Extremity Assessment: Generalized weakness             RLE Deficits / Details: ankle WFL, knee ext -3/5, Hip AAROM WFL LLE Deficits / Details: ankle WFL, hip flexion  to 85* AAROM limited by pain, knee ext -3/5  Cervical / Trunk Assessment: Kyphotic  Communication   Communication: No difficulties  Cognition Arousal/Alertness: Awake/alert Behavior During Therapy: Anxious Overall Cognitive Status: Impaired/Different from baseline Area of Impairment: Orientation     Memory: Decreased short-term memory         General Comments: not oriented to place or situation, unable to provide prior functional hx, stated she needed to get home to her babies    General Comments      Exercises        Assessment/Plan    PT Assessment Patient needs continued PT services  PT Diagnosis Difficulty walking;Acute pain;Generalized weakness   PT Problem List Decreased strength;Decreased range of motion;Decreased activity  tolerance;Decreased mobility;Decreased knowledge of use of DME;Cardiopulmonary status limiting activity;Pain  PT Treatment Interventions DME instruction;Gait training;Functional mobility training;Therapeutic activities;Therapeutic exercise;Patient/family education   PT Goals (Current goals can be found in the Care Plan section) Acute Rehab PT Goals Patient Stated Goal: none stated PT Goal Formulation: With family Time For Goal Achievement: 03/16/14 Potential to Achieve Goals: Fair    Frequency Min 3X/week   Barriers to discharge Decreased caregiver support      Co-evaluation               End of Session Equipment Utilized During Treatment: Oxygen Activity Tolerance: Patient limited by pain (anxiety) Patient left: in bed;with call bell/phone within reach;with family/visitor present;with bed alarm set Nurse Communication: Other (comment) (poor tolerance for movement)         Time: 0277-4128 PT Time Calculation (min): 26 min   Charges:   PT Evaluation $Initial PT Evaluation Tier I: 1 Procedure PT Treatments $Therapeutic Activity: 8-22 mins   PT G Codes:          [name] 03/02/2014, 11:53 AM [phone]

## 2014-03-02 NOTE — Progress Notes (Signed)
ANTICOAGULATION CONSULT NOTE - Initial Consult  Pharmacy Consult for warfarin Indication: atrial fibrillation  Allergies  Allergen Reactions  . Clarithromycin     REACTION: nausea    Patient Measurements: Height: 5\' 5"  (165.1 cm) Weight: 129 lb 1.6 oz (58.559 kg) IBW/kg (Calculated) : 57 Heparin Dosing Weight:   Vital Signs: Temp: 98.3 F (36.8 C) (06/15 0431) Temp src: Oral (06/15 0431) BP: 146/57 mmHg (06/15 0431) Pulse Rate: 95 (06/15 0431)  Labs:  Recent Labs  03/01/14 0158 03/01/14 0418 03/01/14 1200 03/02/14 0431  HGB 5.1* 9.2*  --  10.2*  HCT 15.7* 29.4*  --  32.7*  PLT 572* 278  --  320  APTT 64*  --   --   --   LABPROT 58.4*  --  40.9* 23.8*  INR 7.16*  --  4.49* 2.21*  CREATININE 0.82  --   --  0.66  TROPONINI  --  <0.30  --   --     Estimated Creatinine Clearance: 54.7 ml/min (by C-G formula based on Cr of 0.66).   Medical History: Past Medical History  Diagnosis Date  . Unspecified sinusitis (chronic)   . Obstructive sleep apnea   . COPD (chronic obstructive pulmonary disease)     on home O2, chronic steroid use  . Asthma   . Lung nodule   . SVT (supraventricular tachycardia)   . Atrial flutter     typical appearing  . Hypothyroidism   . GERD (gastroesophageal reflux disease)   . Anxiety   . Depression   . History of radiation therapy 11/08/2009-11/18/2009    left upper lung  . Allergy   . Cancer     lung ca s/p XRT 2011  . Lung cancer     non-small cell ca let upper lung stage IA    Medications:  Prescriptions prior to admission  Medication Sig Dispense Refill  . acetaminophen (TYLENOL) 325 MG tablet Take 2 tablets (650 mg total) by mouth every 6 (six) hours as needed for mild pain.  30 tablet  2  . albuterol (PROVENTIL HFA;VENTOLIN HFA) 108 (90 BASE) MCG/ACT inhaler Inhale 2 puffs into the lungs every 6 (six) hours as needed. For shortness of breath      . ALPRAZolam (XANAX) 0.25 MG tablet Take 1 tablet (0.25 mg total) by mouth  every 8 (eight) hours as needed for anxiety.  30 tablet  0  . antiseptic oral rinse (BIOTENE) LIQD 15 mLs by Mouth Rinse route 2 (two) times daily.  120 mL  2  . buPROPion (WELLBUTRIN SR) 150 MG 12 hr tablet Take 150 mg by mouth 2 (two) times daily.       Marland Kitchen diltiazem (CARDIZEM CD) 240 MG 24 hr capsule take 1 capsule by mouth once daily  30 capsule  6  . EPINEPHrine (EPIPEN) 0.3 mg/0.3 mL DEVI Inject 0.3 mg into the muscle as needed. For allergic reactions      . flecainide (TAMBOCOR) 50 MG tablet take 1 tablet by mouth every 12 hours  60 tablet  5  . Fluticasone-Salmeterol (ADVAIR) 250-50 MCG/DOSE AEPB Inhale 1 puff into the lungs every 12 (twelve) hours.  60 each  6  . guaiFENesin (MUCINEX) 600 MG 12 hr tablet Take 600 mg by mouth every 12 (twelve) hours as needed. For congestion      . ipratropium-albuterol (DUONEB) 0.5-2.5 (3) MG/3ML SOLN Take 3 mLs by nebulization 4 (four) times daily. For shortness of breath And Q4hrs prn  360 mL  11  . levofloxacin (LEVAQUIN) 500 MG tablet Take 500 mg by mouth daily.      Marland Kitchen levothyroxine (SYNTHROID, LEVOTHROID) 75 MCG tablet Take 75 mcg by mouth daily.      . montelukast (SINGULAIR) 10 MG tablet Take 1 tablet (10 mg total) by mouth at bedtime.  30 tablet  5  . morphine (MS CONTIN) 15 MG 12 hr tablet Take 1 tablet (15 mg total) by mouth every 12 (twelve) hours.  30 tablet  0  . nystatin (MYCOSTATIN) 100000 UNIT/ML suspension Take 5 mLs by mouth 3 (three) times daily as needed (pain).      Marland Kitchen omalizumab (XOLAIR) 150 MG injection Inject 225 mg into the skin every 14 (fourteen) days. Friday      . oxyCODONE-acetaminophen (PERCOCET/ROXICET) 5-325 MG per tablet Take 1-2 tablets by mouth every 6 (six) hours as needed for severe pain (pain).  60 tablet  0  . predniSONE (DELTASONE) 10 MG tablet Take 10-50 mg by mouth as directed. Taper dose as follows: 50 mg daily  X 3 days, 40 mg daily x 3 days, 30 mg daily x 3 days, 20 mg daily x 3 days, then return to normal dose of  10 mg daily. (Patient is on day 2 of 40 mg dose)      . risedronate (ACTONEL) 35 MG tablet Take 35 mg by mouth every 7 (seven) days. with water on empty stomach, nothing by mouth or lie down for next 30 minutes; takes on Saturdays      . theophylline (THEODUR) 300 MG 12 hr tablet Take 0.5 tablets (150 mg total) by mouth 2 (two) times daily.  30 tablet  5  . tiotropium (SPIRIVA) 18 MCG inhalation capsule Place 18 mcg into inhaler and inhale daily.      . traMADol (ULTRAM) 50 MG tablet Take 1 tablet (50 mg total) by mouth 4 (four) times daily as needed (pain).  30 tablet  0  . venlafaxine (EFFEXOR) 37.5 MG tablet Take 37.5 mg by mouth daily.       Marland Kitchen zolpidem (AMBIEN) 5 MG tablet Take 2.5 mg by mouth at bedtime as needed. For sleep      . NON FORMULARY CPAP       . NON FORMULARY Oxygen 2-4 LPM      . warfarin (COUMADIN) 2 MG tablet Take 2 mg by mouth every evening.       Scheduled:  . antiseptic oral rinse  15 mL Mouth Rinse BID  . buPROPion  150 mg Oral BID  . ceFEPime (MAXIPIME) IV  1 g Intravenous Q8H  . Chlorhexidine Gluconate Cloth  6 each Topical Q0600  . diltiazem  240 mg Oral Daily  . flecainide  50 mg Oral Q12H  . ipratropium-albuterol  3 mL Nebulization Q4H  . levothyroxine  75 mcg Oral Daily  . mometasone-formoterol  2 puff Inhalation BID  . montelukast  10 mg Oral QHS  . morphine  15 mg Oral Q12H  . mupirocin ointment   Nasal BID  . potassium chloride  20 mEq Oral Once  . predniSONE  10 mg Oral Q breakfast  . theophylline  150 mg Oral BID  . vancomycin  500 mg Intravenous Q12H  . venlafaxine  37.5 mg Oral Daily  . warfarin  1 mg Oral ONCE-1800  . Warfarin - Pharmacist Dosing Inpatient   Does not apply q1800    Assessment: Patient on warfarin PTA for afib.  INR > 3 on admit.  Warfarin on hold until this am, MD wishes for pharmacy to restart warfarin.  INR at goal this AM.  Goal of Therapy:  INR 2-3    Plan:  Start with Coumadin 1 mg tonight. Check PT/INR  daily. Provide Coumadin education.   Tyler Deis, Shea Stakes Crowford 03/02/2014,6:45 AM

## 2014-03-02 NOTE — Clinical Documentation Improvement (Signed)
Query # 1 Possible Clinical Conditions? Septicemia / Sepsis Severe Sepsis Septic Shock Other Condition  Cannot clinically Determine  Supporting Information:( As per notes on admission)HCAP< Pulse 99  Temp(Src) 99.2 F, WBC=12.1  Query #2 Possible Clinical Conditions?  Acute Respiratory Failure  Acute on Chronic Respiratory Failure  Chronic Respiratory Failure  Other Condition  Cannot Clinically Determine   Supporting Information:(As per notes)"Elderly female with history of COPD, admitted to the hospital, x-ray showing pneumonia. Currently being treated for healthcare associated pneumonia. She still has significant wheezing, continue oxygen, frequent nebulized treatments, steroids."  "on home O2, chronic steroid use  Thank You, Alessandra Grout, RN, BSN, CCDS, Clinical Documentation Specialist:  [phone]   [phone]=[employer]- Health Information Management

## 2014-03-02 NOTE — Progress Notes (Signed)
Subjective: Pt feel better   Objective: Vital signs in last 24 hours: Temp:  [98.1 F (36.7 C)-99.2 F (37.3 C)] 98.3 F (36.8 C) (06/15 0431) Pulse Rate:  [95-101] 95 (06/15 0431) Resp:  [20-22] 20 (06/15 0431) BP: (135-155)/(55-60) 146/57 mmHg (06/15 0431) SpO2:  [86 %-100 %] 100 % (06/15 0431) Weight:  [58.559 kg (129 lb 1.6 oz)] 58.559 kg (129 lb 1.6 oz) (06/14 0701) Weight change: -5.398 kg (-11 lb 14.4 oz) Last BM Date: 03/01/14 (smear)  Intake/Output from previous day: 06/14 0701 - 06/15 0700 In: 710 [P.O.:320; I.V.:40; IV Piggyback:350] Out: -  Intake/Output this shift: Total I/O In: 510 [P.O.:320; I.V.:40; IV Piggyback:150] Out: -   General appearance: alert Resp: wheezes bibasilar Cardio: irregularly irregular rhythm Skin: bruising on arms  Lab Results:  Recent Labs  03/01/14 0418 03/02/14 0431  WBC 12.1* 14.7*  HGB 9.2* 10.2*  HCT 29.4* 32.7*  PLT 278 320   BMET  Recent Labs  03/01/14 0158 03/02/14 0431  NA 146 145  K 4.5 3.4*  CL 100 101  CO2 35* 37*  GLUCOSE 124* 136*  BUN 39* 23  CREATININE 0.82 0.66  CALCIUM 9.9 9.1    Studies/Results: Dg Chest 2 View  03/01/2014   CLINICAL DATA:  Shortness of breath and cough.  History of COPD.  EXAM: CHEST  2 VIEW  COMPARISON:  Prior radiograph from 02/20/2014  FINDINGS: The cardiac and mediastinal silhouettes are stable in size and contour, and remain within normal limits.  Changes compatible with COPD again noted. Patchy infiltrate seen within the right upper lobe, suspicious for possible pneumonia.  No acute osseous abnormality identified.  IMPRESSION: 1. Patchy infiltrate within the right upper lobe, suspicious for possible pneumonia. 2. Emphysema.   Electronically Signed   By: Jeannine Boga M.D.   On: 03/01/2014 05:21    Medications: I have reviewed the patient's current medications.  Assessment/Plan: Elevated INR- - resolved with vit K; no bleeding- resume coumadin per pharmacy HCAP:  continue with IV antibiotic. Xray reviewed. COPD: Nebs and low dose prednisone- decrease Prednisone to daily dose of 10 mg OSA: CPAP Aflutter: continue Tambocor/ Cardizem: Replace K H/o insuffiencey fx of pelvic and spine: pain control, PT  DNR Social work: back to SNF- in 1-2 days  LOS: 1 day   [name] 03/02/2014, 6:45 AM

## 2014-03-02 NOTE — Clinical Documentation Improvement (Signed)
Possible Clinical Conditions? Acute Respiratory Failure Acute on Chronic Respiratory Failure Chronic Respiratory Failure Other Condition Cannot Clinically Determine   Supporting Information:(As per notes)"Elderly female with history of COPD, admitted to the hospital, x-ray showing pneumonia. Currently being treated for healthcare associated pneumonia. She still has significant wheezing, continue oxygen, frequent nebulized treatments, steroids."  "on home O2, chronic steroid use  Thank You, Alessandra Grout, RN, BSN, CCDS, Clinical Documentation Specialist:  [place] [facility]

## 2014-03-02 NOTE — Progress Notes (Signed)
Clinical Social Work Department BRIEF PSYCHOSOCIAL ASSESSMENT 03/02/2014  Patient:  [name],[name]     Account Number:  [id number]     Admit date:  03/01/2014  Clinical Social Worker:  Renold Genta  Date/Time:  03/02/2014 03:08 PM  Referred by:  Physician  Date Referred:  03/02/2014 Referred for  Other - See comment   Other Referral:   Admitted from: [facility]   Interview type:  Patient Other interview type:   and husband, Wayne at bedside    PSYCHOSOCIAL DATA Living Status:  FACILITY Admitted from facility:  [facility] Level of care:  Media Primary support name:  Halee Glynn (husband) h#: [phone] c#: [phone] Primary support relationship to patient:  SPOUSE Degree of support available:   good    CURRENT CONCERNS Current Concerns  Post-Acute Placement   Other Concerns:    SOCIAL WORK ASSESSMENT / PLAN CSW received referral that patient was admitted from [facility].   Assessment/plan status:  Information/Referral to [employer] Other assessment/ plan:   Information/referral to community resources:   CSW completed FL2 and faxed information to [facility], confirmed with [facility] that they would be able to take patient back when ready.    PATIENT'S/FAMILY'S RESPONSE TO PLAN OF CARE: Patient & husband state that they have been very pleased with [facility], she was admitted there 5/26 from [facility]. Patient's husband was concerned about her "hospital delirium" & CSW explained that it should improve & go away. Anticipating possible discharge tomorrow.       Raynaldo Opitz, [facility] Clinical Social Worker cell #: [phone]

## 2014-03-02 NOTE — Progress Notes (Signed)
Patient's spouse was irate when RN came into the room. The charge nurse and supervisor were notified and they came to the room to listen to spouse's concerns. There will be follow up with the manager tomorrow.  Patient was fed a sandwich,pears and apple juice by the RN. Patient was turned.

## 2014-03-03 ENCOUNTER — Inpatient Hospital Stay (HOSPITAL_COMMUNITY): Payer: Medicare Other

## 2014-03-03 DIAGNOSIS — J9610 Chronic respiratory failure, unspecified whether with hypoxia or hypercapnia: Secondary | ICD-10-CM

## 2014-03-03 LAB — BASIC METABOLIC PANEL
BUN: 16 mg/dL (ref 6–23)
CO2: 36 mEq/L — ABNORMAL HIGH (ref 19–32)
CREATININE: 0.67 mg/dL (ref 0.50–1.10)
Calcium: 8.8 mg/dL (ref 8.4–10.5)
Chloride: 99 mEq/L (ref 96–112)
GFR, EST NON AFRICAN AMERICAN: 84 mL/min — AB (ref >90)
Glucose, Bld: 101 mg/dL — ABNORMAL HIGH (ref 70–99)
Potassium: 3 mEq/L — ABNORMAL LOW (ref 3.7–5.3)
Sodium: 143 mEq/L (ref 137–147)

## 2014-03-03 LAB — URINALYSIS, ROUTINE W REFLEX MICROSCOPIC
Bilirubin Urine: NEGATIVE
Glucose, UA: NEGATIVE mg/dL
Hgb urine dipstick: NEGATIVE
Ketones, ur: NEGATIVE mg/dL
LEUKOCYTES UA: NEGATIVE
NITRITE: NEGATIVE
PH: 7 (ref 5.0–8.0)
Protein, ur: NEGATIVE mg/dL
SPECIFIC GRAVITY, URINE: 1.011 (ref 1.005–1.030)
UROBILINOGEN UA: 0.2 mg/dL (ref 0.0–1.0)

## 2014-03-03 LAB — LEGIONELLA ANTIGEN, URINE: Legionella Antigen, Urine: NEGATIVE

## 2014-03-03 LAB — PROTIME-INR
INR: 1.58 — AB (ref 0.00–1.49)
PROTHROMBIN TIME: 18.4 s — AB (ref 11.6–15.2)

## 2014-03-03 MED ORDER — IPRATROPIUM-ALBUTEROL 0.5-2.5 (3) MG/3ML IN SOLN
3.0000 mL | Freq: Four times a day (QID) | RESPIRATORY_TRACT | Status: DC
  Administered 2014-03-03 – 2014-03-04 (×5): 3 mL via RESPIRATORY_TRACT
  Filled 2014-03-03 (×5): qty 3

## 2014-03-03 MED ORDER — IPRATROPIUM-ALBUTEROL 0.5-2.5 (3) MG/3ML IN SOLN: 3.0000 mL | RESPIRATORY_TRACT | Status: DC | PRN

## 2014-03-03 MED ORDER — POTASSIUM CHLORIDE CRYS ER 20 MEQ PO TBCR
40.0000 meq | EXTENDED_RELEASE_TABLET | Freq: Once | ORAL | Status: AC
  Administered 2014-03-03: 40 meq via ORAL
  Filled 2014-03-03: qty 2

## 2014-03-03 MED ORDER — PREDNISONE 10 MG PO TABS: 10.0000 mg | ORAL_TABLET | Freq: Every day | ORAL | Status: AC

## 2014-03-03 MED ORDER — WARFARIN SODIUM 2 MG PO TABS
2.0000 mg | ORAL_TABLET | Freq: Once | ORAL | Status: AC
  Administered 2014-03-03: 2 mg via ORAL
  Filled 2014-03-03: qty 1

## 2014-03-03 MED ORDER — LEVOFLOXACIN 500 MG PO TABS: 500.0000 mg | ORAL_TABLET | Freq: Every day | ORAL | Status: AC

## 2014-03-03 NOTE — Progress Notes (Signed)
ADM: ATRIAL FLUTTER MEDICAL HX: OSA,COPD,ASTHMA,LUNG NODULE/LUNG CA HOME REGIMEN: DUONEB QID, DUONEB Q4 PRN, ALBUTEROL INHALER Q6 PRN, ADVAIR BID, HOME CPAP CHEST X- RAY: 03/03/14 RIGHT UPPER  AND LOWER LOBE INFILTRATE ASSESSMENT: HR 95, RR 18, SATS 98% ON 4PLM [place], NO WOB, NO DISTRESS NOTED CURRENT THERAPY: DUONEB Q4, DULERA BID, CPAP PLAN: DUONEB QID, DULERA BID, DUONEB Q4 PRN, CPAP RT CHANGED NEBS DUE TO ASSESSMENT

## 2014-03-03 NOTE — Progress Notes (Signed)
Subjective: Confusion  alert  Objective: Vital signs in last 24 hours: Temp:  [97.6 F (36.4 C)-98.4 F (36.9 C)] 97.8 F (36.6 C) (06/16 0512) Pulse Rate:  [95-110] 95 (06/16 0512) Resp:  [16-20] 19 (06/16 0512) BP: (132-150)/(50-65) 150/65 mmHg (06/16 0512) SpO2:  [90 %-98 %] 98 % (06/16 0512) Weight change:  Last BM Date: 03/02/14 (barrier cream to buttocks/perineum)  Intake/Output from previous day: 06/15 0701 - 06/16 0700 In: 1160 [P.O.:780; IV Piggyback:350] Out: 350 [Urine:350] Intake/Output this shift: Total I/O In: 540 [P.O.:360; Other:30; IV Piggyback:150] Out: -   General appearance: alert Resp: rhonchi bibasilar Cardio: regular rate and rhythm  Lab Results:  Recent Labs  03/01/14 0418 03/02/14 0431  WBC 12.1* 14.7*  HGB 9.2* 10.2*  HCT 29.4* 32.7*  PLT 278 320   BMET  Recent Labs  03/01/14 0158 03/02/14 0431  NA 146 145  K 4.5 3.4*  CL 100 101  CO2 35* 37*  GLUCOSE 124* 136*  BUN 39* 23  CREATININE 0.82 0.66  CALCIUM 9.9 9.1    Studies/Results: No results found.  Medications: I have reviewed the patient's current medications.  Assessment/Plan: Elevated INR- - resolved with vit K; no bleeding- resume coumadin per pharmacy  HCAP: continue with IV antibiotic. Xray today. Delirium: could be metabolic Encephalopathy; ? Drugs- d/c percocet; continue MS contin and tramadol. Ck urine Chronic respiratory failure with underlying COPD/OSA/ COPD: Nebs and low dose prednisone- decrease Prednisone to daily dose of 10 mg  OSA: CPAP  Aflutter: continue Tambocor/ Cardizem:  Replace K  H/o insuffiencey fx of pelvic and spine: pain control, PT  DNR  Social work: back to [employer].   LOS: 2 days   [name],[name] 03/03/2014, 7:00 AM

## 2014-03-03 NOTE — Care Management Note (Signed)
    Page 1 of 1   03/03/2014     3:05:45 PM CARE MANAGEMENT NOTE 03/03/2014  Patient:  [name],[name]   Account Number:  [id number]  Date Initiated:  03/02/2014  Documentation initiated by:  Olga Coaster  Subjective/Objective Assessment:   ADMITTED WITH WITH ELEVATED INR, COPD     Action/Plan:   PATIENT RESIDES IN SKILLED NURSING FACILITY; [facility] WORKER REFERRAL PLACED   Anticipated DC Date:  03/04/2014   Anticipated DC Plan:  SKILLED NURSING FACILITY  In-house referral  Clinical Social Worker      DC Planning Services  CM consult      Choice offered to / List presented to:             Status of service:  In process, will continue to follow Medicare Important Message given?  YES (If response is "NO", the following Medicare IM given date fields will be blank) Date Medicare IM given:  03/03/2014 Date Additional Medicare IM given:    Discharge Disposition:    Per UR Regulation:  Reviewed for med. necessity/level of care/duration of stay  If discussed at [place] of Stay Meetings, dates discussed:    Comments:  03/03/14 [facility] RN,BSN NCM [facility].  6/15/2015Mindi Slicker RN,BSN,MHA [phone]

## 2014-03-03 NOTE — Progress Notes (Signed)
Placed pt on her home cpap unit for rest.  4l O2 bled in.  No frays on cord or obvious defected noted.  Pt is tolerating well at this time.  RN notified.  Call will be placed for [employer] to inspect patient's home cpap machine in the morning.

## 2014-03-03 NOTE — Progress Notes (Signed)
ANTICOAGULATION CONSULT NOTE - Follow Up Consult  Pharmacy Consult for Warfarin Indication: AFib  Allergies  Allergen Reactions  . Clarithromycin     REACTION: nausea    Patient Measurements: Height: 5\' 5"  (165.1 cm) Weight: 129 lb 1.6 oz (58.559 kg) IBW/kg (Calculated) : 57  Vital Signs: Temp: 97.8 F (36.6 C) (06/16 0512) Temp src: Oral (06/16 0512) BP: 150/65 mmHg (06/16 0512) Pulse Rate: 95 (06/16 0512)  Labs:  Recent Labs  03/01/14 0158 03/01/14 0418 03/01/14 1200 03/02/14 0431 03/03/14 0416  HGB 5.1* 9.2*  --  10.2*  --   HCT 15.7* 29.4*  --  32.7*  --   PLT 572* 278  --  320  --   APTT 64*  --   --   --   --   LABPROT 58.4*  --  40.9* 23.8* 18.4*  INR 7.16*  --  4.49* 2.21* 1.58*  CREATININE 0.82  --   --  0.66  --   TROPONINI  --  <0.30  --   --   --     Estimated Creatinine Clearance: 54.7 ml/min (by C-G formula based on Cr of 0.66).  Assessment: 83 yoF at rehab recovering from fall with back/pelvis fx on warfarin PTA for aflutter. Other PMHx: chronic sinusitis, OSA, COPD, asthma, hypothyroid, GERD, lung CA. Recently treated for AECOPD with abx, pred taper and family reports confusion. Noted to have bloody discharge around lips, evidence of PNA on CXR. INR elevated @ 7.16 on admission and pt received Vit K 2.5mg  PO x 1 on 6/14 and warfarin held. MD wishes for pharmacy to restart warfarin 6/15.  Home dose: Warf 2mg  daily, LD unknown -pt holding  6/16: INR now subtherapeutic.  CBC yesterday ok.  No bleeding noted.  0-40% of meals eaten.  Received warfarin 1mg  last night.   Goal of Therapy:  INR 2-3 Monitor platelets by anticoagulation protocol: Yes   Plan:  Warfarin 2mg  x 1 at noon today Daily PT/INR  Ralene Bathe, PharmD, BCPS 03/03/2014, 8:29 AM  Pager: [phone]

## 2014-03-04 LAB — PROTIME-INR
INR: 1.7 — ABNORMAL HIGH (ref 0.00–1.49)
PROTHROMBIN TIME: 19.5 s — AB (ref 11.6–15.2)

## 2014-03-04 MED ORDER — TRAMADOL HCL 50 MG PO TABS: 50.0000 mg | ORAL_TABLET | Freq: Four times a day (QID) | ORAL | Status: AC | PRN

## 2014-03-04 MED ORDER — ALPRAZOLAM 0.25 MG PO TABS: 0.2500 mg | ORAL_TABLET | Freq: Three times a day (TID) | ORAL | Status: AC | PRN

## 2014-03-04 MED ORDER — MORPHINE SULFATE ER 15 MG PO TBCR: 15.0000 mg | EXTENDED_RELEASE_TABLET | Freq: Two times a day (BID) | ORAL | Status: AC

## 2014-03-04 NOTE — Discharge Summary (Signed)
Physician Discharge Summary  Patient ID: [name] MRN: [id number] DOB/AGE: 26-May-1936 78 y.o.  Admit date: 03/01/2014 Discharge date: 03/04/2014  Admission Diagnoses:  Discharge Diagnoses:  Active Problems:   OBSTRUCTIVE SLEEP APNEA   COPD with emphysema   Atrial flutter   HCAP (healthcare-associated pneumonia)-right lung   Elevated INR   Chronic respiratory failure Delirium History of insuffiencey fracture of  pelvic and spine. deconditioning  Weakness Anemia Hypokalemia History of lung cancer  Discharged Condition: good  Hospital Course:78 years old female admitted from the nursing rehabilitation facility with elevated INR, at the facility was reported as 12 she did got vitamin K does note to have some bleeding. Problem #1: Elevated INR: In the emergency room it was 1.2 initial blood work showed hemoglobin of 5.1 which was an error there was no evidence of bleeding her repeat hemoglobin was 9.2,  guaiac-negative stool. Patient received vitamin K INR came down to 2.2. Coumadin was restarted back with daily INRs check, started on 2 mg daily INR time of discharge was 1.7. No evidence of bleeding. Problem #2: Healthcare acquired pneumonia right upper and middle lobe, started on broad-spectrum antibiotics vancomycin and cefepime. Oxygen remained stable. She had underlying history of severe COPD O2 dependent continue nebulizers and oxygen. She is on tapering dose of prednisone. Repeat chest x-ray showed improving pneumonia. Antibiotic switched to Levaquin for 10 days. Strep and Legionella test negative Problem #3: Severe COPD O2 dependent continue nebulizers and oxygen Problem #4: Sleep apnea continue CPAP at night Problem #5: Increasing confusion the hospital urinalysis negative, hospital-acquired delirium multifactorial; Percocet was discontinued. Continue monitoring. Xanax p.r.n. Problem #5: Anemia stable no evidence of GI bleed last hemoglobin 9.3 Problem #6: Hyperkalemia  potassium replaced recheck potassium at the facility. Problem #7: History of recent fractures of the spine and the pelvic, due to osteoporosis, continue on Fosamax, MS Contin for pain and Tylenol p.r.n.Marland Kitchen pain control adequate continue physical therapy as tolerated.  Consults: None  Significant Diagnostic Studies: labs: PT/INR 1.7, urinalysis negative creatinine 0.6 sodium 143, Legionella and strep antigen negative, hemoglobin 10.2 troponin negative, microbiology: blood culture: negative and radiology: CXR: infiltrates: upper lobe on the right  Treatments: antibiotics: vancomycin and cfepime  Discharge Exam: Blood pressure 130/64, pulse 94, temperature 97.5 F (36.4 C), temperature source Oral, resp. rate 18, height 5\' 5"  (1.651 m), weight 58.559 kg (129 lb 1.6 oz), SpO2 98.00%. General appearance: alert Resp: rhonchi RUL Cardio: regular rate and rhythm GI: soft, non-tender; bowel sounds normal; no masses,  no organomegaly  Disposition: 03-Skilled Nursing Facility  Discharge Instructions   Diet - low sodium heart healthy    Complete by:  As directed      Discharge instructions    Complete by:  As directed   Check daily PT/INR- monitor PT closely- keep INR 2-3 Check BMET- on 03/06/14     Increase activity slowly    Complete by:  As directed             Medication List    STOP taking these medications       oxyCODONE-acetaminophen 5-325 MG per tablet  Commonly known as:  PERCOCET/ROXICET      TAKE these medications       acetaminophen 325 MG tablet  Commonly known as:  TYLENOL  Take 2 tablets (650 mg total) by mouth every 6 (six) hours as needed for mild pain.     albuterol 108 (90 BASE) MCG/ACT inhaler  Commonly known as:  PROVENTIL HFA;VENTOLIN HFA  Inhale 2 puffs into the lungs every 6 (six) hours as needed. For shortness of breath     ALPRAZolam 0.25 MG tablet  Commonly known as:  XANAX  Take 1 tablet (0.25 mg total) by mouth every 8 (eight) hours as needed for  anxiety.     antiseptic oral rinse Liqd  15 mLs by Mouth Rinse route 2 (two) times daily.     buPROPion 150 MG 12 hr tablet  Commonly known as:  WELLBUTRIN SR  Take 150 mg by mouth 2 (two) times daily.     diltiazem 240 MG 24 hr capsule  Commonly known as:  CARDIZEM CD  take 1 capsule by mouth once daily     EPIPEN 0.3 mg/0.3 mL Devi  Generic drug:  EPINEPHrine  Inject 0.3 mg into the muscle as needed. For allergic reactions     flecainide 50 MG tablet  Commonly known as:  TAMBOCOR  take 1 tablet by mouth every 12 hours     Fluticasone-Salmeterol 250-50 MCG/DOSE Aepb  Commonly known as:  ADVAIR  Inhale 1 puff into the lungs every 12 (twelve) hours.     guaiFENesin 600 MG 12 hr tablet  Commonly known as:  MUCINEX  Take 600 mg by mouth every 12 (twelve) hours as needed. For congestion     ipratropium-albuterol 0.5-2.5 (3) MG/3ML Soln  Commonly known as:  DUONEB  - Take 3 mLs by nebulization 4 (four) times daily. For shortness of breath  - And Q4hrs prn     levofloxacin 500 MG tablet  Commonly known as:  LEVAQUIN  Take 1 tablet (500 mg total) by mouth daily.     levothyroxine 75 MCG tablet  Commonly known as:  SYNTHROID, LEVOTHROID  Take 75 mcg by mouth daily.     montelukast 10 MG tablet  Commonly known as:  SINGULAIR  Take 1 tablet (10 mg total) by mouth at bedtime.     morphine 15 MG 12 hr tablet  Commonly known as:  MS CONTIN  Take 1 tablet (15 mg total) by mouth every 12 (twelve) hours.     NON FORMULARY  CPAP     NON FORMULARY  Oxygen 2-4 LPM     nystatin 100000 UNIT/ML suspension  Commonly known as:  MYCOSTATIN  Take 5 mLs by mouth 3 (three) times daily as needed (pain).     omalizumab 150 MG injection  Commonly known as:  XOLAIR  Inject 225 mg into the skin every 14 (fourteen) days. Friday     predniSONE 10 MG tablet  Commonly known as:  DELTASONE  Take 1 tablet (10 mg total) by mouth daily. For 3 days.     risedronate 35 MG tablet  Commonly  known as:  ACTONEL  Take 35 mg by mouth every 7 (seven) days. with water on empty stomach, nothing by mouth or lie down for next 30 minutes; takes on Saturdays     theophylline 300 MG 12 hr tablet  Commonly known as:  THEODUR  Take 0.5 tablets (150 mg total) by mouth 2 (two) times daily.     tiotropium 18 MCG inhalation capsule  Commonly known as:  SPIRIVA  Place 18 mcg into inhaler and inhale daily.     traMADol 50 MG tablet  Commonly known as:  ULTRAM  Take 1 tablet (50 mg total) by mouth 4 (four) times daily as needed (pain).     venlafaxine 37.5 MG tablet  Commonly known as:  EFFEXOR  Take 37.5  mg by mouth daily.     warfarin 2 MG tablet  Commonly known as:  COUMADIN  Take 2 mg by mouth every evening.     zolpidem 5 MG tablet  Commonly known as:  AMBIEN  Take 2.5 mg by mouth at bedtime as needed. For sleep      discharge  Planning time total: 45 minutes   Signed: HUSAIN,KARRAR 03/04/2014, 7:46 AM

## 2014-03-04 NOTE — Progress Notes (Signed)
Patient is set to discharge back to [facility] today. Patient & husband, [name] aware. Discharge packet given to RN, Raquel Sarna. PTAR scheduled for 10:15am pickup.   Raynaldo Opitz, [facility] Clinical Social Worker cell #: [phone]

## 2014-03-06 ENCOUNTER — Telehealth: Payer: Self-pay | Admitting: Internal Medicine

## 2014-03-06 ENCOUNTER — Ambulatory Visit (HOSPITAL_COMMUNITY): Payer: Medicare Other

## 2014-03-06 NOTE — Telephone Encounter (Signed)
Suggest prednisone 10 mg, 1 daily, # 30, ref x3  She can call the Allergy Lab and schedule to resume her Xolair shots at previous interval

## 2014-03-06 NOTE — Telephone Encounter (Signed)
Called spoke with tonya from [facility]. Pt was admitted to the hospital again 03/01/14-03/04/14. Pt was d/c'd from the hospital stating to d/c prednisone after 3 days. She wants to know if this is so or does she need to continue on this daily? She wants to know if pt needs to continue on xolair and when to schedule this with our office? Pt saw TP 02/20/14 for HFU. Please advise Dr. Annamaria Boots thanks

## 2014-03-06 NOTE — Telephone Encounter (Signed)
[name] from [facility] returned call.

## 2014-03-06 NOTE — Telephone Encounter (Signed)
lmomtcb x1 for [employer]

## 2014-03-06 NOTE — Telephone Encounter (Signed)
Called spoke with [place]. Gave VO regarding CDY recs. She voiced her understanding and needed nothing further

## 2014-03-11 ENCOUNTER — Other Ambulatory Visit (HOSPITAL_COMMUNITY): Payer: Self-pay | Admitting: Internal Medicine

## 2014-03-11 DIAGNOSIS — R1310 Dysphagia, unspecified: Secondary | ICD-10-CM

## 2014-03-11 DIAGNOSIS — K2100 Gastro-esophageal reflux disease with esophagitis, without bleeding: Secondary | ICD-10-CM

## 2014-03-12 ENCOUNTER — Ambulatory Visit: Payer: Medicare Other | Admitting: Radiation Oncology

## 2014-03-16 ENCOUNTER — Ambulatory Visit (HOSPITAL_COMMUNITY): Payer: Medicare Other

## 2014-03-16 ENCOUNTER — Inpatient Hospital Stay (HOSPITAL_COMMUNITY): Admission: RE | Admit: 2014-03-16 | Payer: Medicare Other | Source: Ambulatory Visit

## 2014-03-18 DEATH — deceased

## 2014-04-02 ENCOUNTER — Ambulatory Visit: Payer: Medicare Other | Admitting: Radiation Oncology

## 2014-04-03 ENCOUNTER — Ambulatory Visit: Payer: Medicare Other | Admitting: Internal Medicine

## 2014-04-23 ENCOUNTER — Ambulatory Visit: Payer: Medicare Other | Admitting: Internal Medicine

## 2014-04-24 ENCOUNTER — Telehealth: Payer: Self-pay

## 2014-04-24 NOTE — Telephone Encounter (Signed)
Patient died per [name]

## 2014-04-27 ENCOUNTER — Ambulatory Visit: Payer: Medicare Other | Admitting: Internal Medicine

## 2014-06-24 ENCOUNTER — Encounter: Payer: Self-pay | Admitting: Internal Medicine

## 2014-07-31 ENCOUNTER — Encounter: Payer: Self-pay | Admitting: Internal Medicine

## 2014-08-27 ENCOUNTER — Encounter (HOSPITAL_COMMUNITY): Payer: Self-pay | Admitting: Internal Medicine

## 2015-01-12 ENCOUNTER — Encounter: Payer: Self-pay | Admitting: Internal Medicine

## 2015-04-12 ENCOUNTER — Encounter: Payer: Self-pay | Admitting: Internal Medicine

## 2015-08-25 ENCOUNTER — Encounter: Payer: Self-pay | Admitting: Internal Medicine
